# Patient Record
Sex: Female | Born: 1950
Health system: Southern US, Community
[De-identification: ages and names within clinical notes are randomized; demographics above are authoritative.]

## PROBLEM LIST (undated history)

## (undated) DIAGNOSIS — E039 Hypothyroidism, unspecified: Secondary | ICD-10-CM

## (undated) DIAGNOSIS — F419 Anxiety disorder, unspecified: Secondary | ICD-10-CM

## (undated) DIAGNOSIS — I10 Essential (primary) hypertension: Secondary | ICD-10-CM

## (undated) DIAGNOSIS — I499 Cardiac arrhythmia, unspecified: Secondary | ICD-10-CM

## (undated) DIAGNOSIS — G709 Myoneural disorder, unspecified: Secondary | ICD-10-CM

## (undated) DIAGNOSIS — K219 Gastro-esophageal reflux disease without esophagitis: Secondary | ICD-10-CM

## (undated) DIAGNOSIS — E785 Hyperlipidemia, unspecified: Secondary | ICD-10-CM

## (undated) HISTORY — DX: Hyperlipidemia, unspecified: E78.5

## (undated) HISTORY — DX: Gastro-esophageal reflux disease without esophagitis: K21.9

## (undated) HISTORY — PX: APPENDECTOMY: SHX54

## (undated) HISTORY — PX: KNEE ARTHROSCOPY: SUR90

## (undated) HISTORY — DX: Hypothyroidism, unspecified: E03.9

## (undated) HISTORY — PX: CHOLECYSTECTOMY: SHX55

## (undated) HISTORY — PX: PARTIAL HYSTERECTOMY: SHX80

## (undated) HISTORY — PX: CARPAL TUNNEL RELEASE: SHX101

---

## 1997-10-08 ENCOUNTER — Ambulatory Visit (HOSPITAL_COMMUNITY): Admission: RE | Admit: 1997-10-08 | Discharge: 1997-10-08 | Payer: Self-pay | Admitting: Gastroenterology

## 1998-02-26 ENCOUNTER — Ambulatory Visit (HOSPITAL_COMMUNITY): Admission: RE | Admit: 1998-02-26 | Discharge: 1998-02-26 | Payer: Self-pay | Admitting: Family Medicine

## 1998-03-02 ENCOUNTER — Encounter: Payer: Self-pay | Admitting: Family Medicine

## 1998-03-02 ENCOUNTER — Ambulatory Visit (HOSPITAL_COMMUNITY): Admission: RE | Admit: 1998-03-02 | Discharge: 1998-03-02 | Payer: Self-pay | Admitting: Family Medicine

## 2002-02-08 ENCOUNTER — Observation Stay (HOSPITAL_COMMUNITY): Admission: RE | Admit: 2002-02-08 | Discharge: 2002-02-09 | Payer: Self-pay | Admitting: Surgery

## 2002-02-08 ENCOUNTER — Encounter: Payer: Self-pay | Admitting: Surgery

## 2002-02-08 ENCOUNTER — Encounter (INDEPENDENT_AMBULATORY_CARE_PROVIDER_SITE_OTHER): Payer: Self-pay | Admitting: Specialist

## 2003-01-31 ENCOUNTER — Ambulatory Visit (HOSPITAL_BASED_OUTPATIENT_CLINIC_OR_DEPARTMENT_OTHER): Admission: RE | Admit: 2003-01-31 | Discharge: 2003-01-31 | Payer: Self-pay | Admitting: Orthopedic Surgery

## 2003-01-31 ENCOUNTER — Ambulatory Visit (HOSPITAL_COMMUNITY): Admission: RE | Admit: 2003-01-31 | Discharge: 2003-01-31 | Payer: Self-pay | Admitting: Orthopedic Surgery

## 2003-04-07 ENCOUNTER — Ambulatory Visit (HOSPITAL_COMMUNITY): Admission: RE | Admit: 2003-04-07 | Discharge: 2003-04-07 | Payer: Self-pay | Admitting: Gastroenterology

## 2003-08-04 ENCOUNTER — Ambulatory Visit (HOSPITAL_COMMUNITY): Admission: RE | Admit: 2003-08-04 | Discharge: 2003-08-04 | Payer: Self-pay | Admitting: Gastroenterology

## 2004-03-08 ENCOUNTER — Ambulatory Visit: Payer: Self-pay | Admitting: Family Medicine

## 2004-05-08 ENCOUNTER — Emergency Department (HOSPITAL_COMMUNITY): Admission: EM | Admit: 2004-05-08 | Discharge: 2004-05-08 | Payer: Self-pay | Admitting: Emergency Medicine

## 2004-09-01 ENCOUNTER — Ambulatory Visit: Payer: Self-pay | Admitting: Family Medicine

## 2004-11-15 ENCOUNTER — Ambulatory Visit: Payer: Self-pay | Admitting: Family Medicine

## 2005-01-04 ENCOUNTER — Ambulatory Visit: Payer: Self-pay | Admitting: Family Medicine

## 2005-03-02 ENCOUNTER — Encounter: Admission: RE | Admit: 2005-03-02 | Discharge: 2005-03-02 | Payer: Self-pay | Admitting: Neurosurgery

## 2005-04-05 ENCOUNTER — Ambulatory Visit: Payer: Self-pay | Admitting: Family Medicine

## 2005-04-06 ENCOUNTER — Ambulatory Visit (HOSPITAL_COMMUNITY): Admission: RE | Admit: 2005-04-06 | Discharge: 2005-04-06 | Payer: Self-pay | Admitting: Family Medicine

## 2005-05-25 ENCOUNTER — Ambulatory Visit: Payer: Self-pay | Admitting: Family Medicine

## 2005-07-06 ENCOUNTER — Ambulatory Visit: Payer: Self-pay | Admitting: Family Medicine

## 2005-08-31 ENCOUNTER — Ambulatory Visit: Payer: Self-pay | Admitting: Family Medicine

## 2005-10-11 ENCOUNTER — Ambulatory Visit: Payer: Self-pay | Admitting: Family Medicine

## 2006-01-03 ENCOUNTER — Ambulatory Visit: Payer: Self-pay | Admitting: Family Medicine

## 2006-03-13 ENCOUNTER — Ambulatory Visit: Payer: Self-pay | Admitting: Family Medicine

## 2006-06-20 ENCOUNTER — Ambulatory Visit: Payer: Self-pay | Admitting: Family Medicine

## 2006-07-27 ENCOUNTER — Encounter (INDEPENDENT_AMBULATORY_CARE_PROVIDER_SITE_OTHER): Payer: Self-pay | Admitting: Specialist

## 2006-07-27 ENCOUNTER — Encounter: Admission: RE | Admit: 2006-07-27 | Discharge: 2006-07-27 | Payer: Self-pay | Admitting: Gastroenterology

## 2006-07-27 ENCOUNTER — Inpatient Hospital Stay (HOSPITAL_COMMUNITY): Admission: EM | Admit: 2006-07-27 | Discharge: 2006-07-28 | Payer: Self-pay | Admitting: Emergency Medicine

## 2006-09-14 ENCOUNTER — Ambulatory Visit: Payer: Self-pay | Admitting: Family Medicine

## 2009-07-17 ENCOUNTER — Encounter: Admission: RE | Admit: 2009-07-17 | Discharge: 2009-07-17 | Payer: Self-pay | Admitting: Family Medicine

## 2009-10-20 ENCOUNTER — Encounter: Admission: RE | Admit: 2009-10-20 | Discharge: 2009-10-20 | Payer: Self-pay | Admitting: Gastroenterology

## 2010-09-17 NOTE — Op Note (Signed)
[name], [name]                          ACCOUNT NO.:  [id number]   MEDICAL RECORD NO.:  [id number]                   PATIENT TYPE:  OBV   LOCATION:  0466                                 FACILITY:  [facility]   PHYSICIAN:  Currie Paris, M.D.           DATE OF BIRTH:  04/05/1951   DATE OF PROCEDURE:  DATE OF DISCHARGE:                                 OPERATIVE REPORT   CCS#:  [id number]   PREOPERATIVE DIAGNOSES:  1. Biliary diskinesia.  2. Umbilical hernia.   POSTOPERATIVE DIAGNOSES:  1. Biliary diskinesia.  2. Umbilical hernia.   OPERATION:  1. Laparoscopic cholecystectomy with operative cholangiogram.  2. Repair of umbilical hernia.   SURGEON:  Currie Paris, M.D.   ASSISTANT:  Adolph Pollack, M.D.   ANESTHESIA:  General endotracheal.   CLINICAL HISTORY:  This is a 60 year old with biliary type symptoms and a  workup which did not show stones but did show only a 16% ejection fraction  and classic symptoms with injection of CCK. She also had a small umbilical  hernia that she desired to have repaired and we told her we could do that as  we went through the area surgically.   DESCRIPTION OF PROCEDURE:  The patient was seen in the holding area and had  no further questions. She was taken to the operating room and after  satisfactory general endotracheal anesthesia had been obtained, the abdomen  was prepped and draped. The 0.25% plain Marcaine was used for each incision.  I made a longitudinal umbilical incision, identified the hernia and opened  the sac and used this for entry to the abdomen. A holding suture of #0  Vicryl was placed and Hasson introduced and the abdomen insufflated to 15.  Placing the camera, the gallbladder looked normal and we saw no gross  abnormalities that we could identified of the small bowel. We could not  really see pelvic organs.   Additional cannulas were placed in the epigastrium and two in the right  upper quadrant under  direct vision. The gallbladder was retracted over the  liver.   The peritoneum over the cystic duct was opened and the cystic duct and  cystic artery dissected out and we could see the common duct. Once I had the  anatomy identified, I put a single clip on the cystic artery and a single  clip on the cystic duct near its junction with the gallbladder. The cystic  duct was opened and a small amount of bile drained out.   A Cook catheter was then placed percutaneously and threaded into the cystic  duct and operative cholangiography done. This showed a fairly long cystic  duct, normal filling of the common hepatic duct, hepatic radicles and  duodenum but no evidence of any filling defects.   The cystic duct catheter was removed and three clips placed on the stay side  of the cystic duct and  it was divided. Additional clips were placed on the  cystic artery and it was divided leaving two on the stay side. A small  branch had also been clipped earlier.   The gallbladder was removed from below to above with coagulation current of  the cautery. It was fairly thin walled and we did see a little bile so at  the end we put this in a bile bag and irrigated it out with a full liter of  saline to make sure everything was clear.   The gallbladder was pulled out the umbilical port. A final check was made  for hemostasis and everything appeared to be dry, the remaining ports  removed.   Using cautery, I elevated the skin off the fascia and cleared off the  umbilical defect which was about a 1 1/2 cm in diameter. Once I had good  fascia, I closed this with interrupted sutures of #0 Prolene ducking the  knots so they would not be prominent underneath the subcutaneous tissues.  This closed easily with no tension. Because we had done a cholecystectomy, I  elected not to put any mesh in.   The incision was then closed with 4-0 Monocryl subcuticular as were the  other skin incisions or Steri-Strips. The  patient tolerated the procedure  well. There were no operative complications and all counts were correct.                                               Currie Paris, M.D.    CJS/MEDQ  D:  02/08/2002  T:  02/08/2002  Job:  [id number]   cc:   Delaney Meigs, M.D.  [address]  [place]  [place] [address]  Fax: [phone]

## 2010-09-17 NOTE — Op Note (Signed)
NAMESHAMELL, HITTLE                          ACCOUNT NO.:  [id number]   MEDICAL RECORD NO.:  [id number]                   PATIENT TYPE:  AMB   LOCATION:  ENDO                                 FACILITY:  [facility]   PHYSICIAN:  James L. Malon Kindle., M.D.          DATE OF BIRTH:  March 04, 1951   DATE OF PROCEDURE:  08/04/2003  DATE OF DISCHARGE:                                 OPERATIVE REPORT   PROCEDURE PERFORMED:  Colonoscopy.   ENDOSCOPIST:  Llana Aliment. Edwards, M.D.   MEDICATIONS:  Fentanyl 100 mcg, Versed 10 mg IV.   INSTRUMENT USED:  Pediatric Olympus adjustable colonoscope.   INDICATIONS FOR PROCEDURE:  Strong family history of colon cancer, father  died of colon cancer.  This is done as routine colon screening.   DESCRIPTION OF PROCEDURE:  The procedure had been explained to the patient  and consent obtained.  With the patient in the left lateral decubitus  position, the Olympus scope was inserted and advanced. The prep was  excellent.  We were able to reach the cecum using abdominal pressure and  placing the patient on her right side.  The scope was withdrawn and the  cecum, ascending colon, transverse colon, splenic flexure, descending and  sigmoid colon were seen well upon removal.  No polyps were seen throughout  the colon.  There was no significant diverticular disease.  The scope was  withdrawn and the rectum was free of polyps or any other lesions.  The  patient tolerated the procedure well, maintained on low-flow oxygen and  pulse oximeter throughout the procedure.   ASSESSMENT:  Strong family history of colon cancer with negative  colonoscopy, V16.0.   PLAN:  Will recommend yearly Hemoccults and repeat procedure in five years.                                               James L. Malon Kindle., M.D.    Waldron Session  D:  08/04/2003  T:  08/04/2003  Job:  [id number]   cc:   Delaney Meigs, M.D.  [address]  [place]  [place] [address]  Fax: [phone]

## 2010-09-17 NOTE — H&P (Signed)
[name], [name]                  ACCOUNT NO.:  [id number]   MEDICAL RECORD NO.:  [id number]          PATIENT TYPE:  EMS   LOCATION:  ED                           FACILITY:  [facility]   PHYSICIAN:  Angelia Mould. Derrell Lolling, M.D.DATE OF BIRTH:  27-Sep-1950   DATE OF ADMISSION:  07/27/2006  DATE OF DISCHARGE:                              HISTORY & PHYSICAL   CHIEF COMPLAINT:  Abdominal pain, rule out appendicitis.   HISTORY OF PRESENT ILLNESS:  This is a 60 year old white female who  gives a 4 to 5-day history of abdominal pain.  She says that it has  always been more on the right side than anywhere else, but is rather  diffuse, associated with burping and a sensation of bloating and  distension.  She has chronic heartburn, this has been exacerbated.  She  denies vomiting.  She had a bowel movement this morning which was  normal.  She had a bowel movement 3 days ago which was normal.  She had  a loose stool after taking CT scan contrast today.  She says the pain is  no worse today and after getting the CT scan on the way home she ate a  sandwich and a small baked potato at 5:30 p.m.  She denies any voiding  symptoms.   I was called by Dr. Carman Ching this evening.  He stated he had gotten  a CT scan, which was read by Dr. Vear Clock, which was consistent with  appendicitis.  This looks like an enlarged appendix with a little bit of  inflammatory stranding, with the appendix in the retrocecal location.  There is no sign of any bowel obstruction, there is no sign of any free  fluid or abscess, no sign of any other disease process that I can  identify.  I felt that it was best to have her come to the emergency  room for evaluation considering the CT scan and the continued symptoms.   PAST HISTORY:  1. Laparoscopic cholecystectomy 2003 by Dr. Jamey Ripa.  2. Total abdominal hysterectomy 1995, her ovaries remain in place.  3. Bilateral carpal tunnel surgery by Dr. Merlyn Lot.  4. Tonsillectomy as a  child.  5. She has degenerative disk disease and has had a back injection.  6. She has gastroesophageal reflux disease.  7. She has type 2 diabetes.  8. She has hypertension.   CURRENT MEDICATIONS:  1. Actos 30 mg a day.  2. Biotin 2000 mg a day.  3. Finasteride dose unknown 1 a day.  4. Lisinopril 30 mg a day.  5. Metoprolol 25 mg b.i.d.  6. Nexium 30 mg b.i.d.  7. Spironolactone dose unknown daily.  8. Synthroid 100 mcg per day.  9. Darvocet N 100 p.r.n.  10.Fexofenadine p.r.n.  11.Lasix p.r.n.   DRUG ALLERGIES:  DILAUDID.   SOCIAL HISTORY:  The patient is married.  This is her second marriage.  She has two children of her own.  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  Denies the use of alcohol or tobacco.   FAMILY HISTORY:  Father  died of colon cancer.  Mother 80, living, has  dementia.  The patient is an only child.   REVIEW OF SYSTEMS:  Fifteen-system review of systems is performed and is  noncontributory except as described above.  It should be mentioned that  3 or 4 weeks ago she had a workup by Delfin Edis because of upper  abdominal and chest discomfort.  She says she had an echo and a stress  test which was unremarkable, and Dr. Deborah Chalk told her it was most likely  GI symptoms.   PHYSICAL EXAMINATION:  Pleasant, overweight, middle-aged white female in  mild distress.  Temperature 98.7, pulse 105, respirations 20, blood pressure 137/93.  EYES:  Sclerae clear.  Extraocular movements intact.  Ears, mouth, throat, nose, lips, tongue and oropharynx are without gross  lesions.  NECK:  Supple, nontender.  No mass.  No jugular venous distension.  LUNGS:  Clear to auscultation.  No chest Suddeth tenderness.  HEART:  Regular rate and rhythm, no murmur.  Radial and dorsalis pedis  pulses are palpable.  No peripheral edema.  BREASTS:  Not examined.  ABDOMEN:  Obese.  The abdomen is soft but somewhat tender everywhere,  perhaps more tender on the right side but not  dramatically so.  There is  no mass.  She has well-healed upper abdominal trocar incisions from her  lap chole.  She has a well-healed Pfannenstiel incision.  I do not feel  any masses or hernias.  EXTREMITIES:  She moves all four extremities well without pain or  deformity.  NEUROLOGIC:  No gross motor or sensory deficits.   ADMISSION DATA:  CT scan as described above.  White blood cell count  11,900, potassium 3.5, glucose 115, lipase 30.  Urinalysis pending.   ASSESSMENT:  1. Probable retrocecal appendicitis.  Certainly the diagnosis is not      100% certain given her atypical history, but the CT scan is      strongly suggestive and she has had continued daily pain and I      think that a diagnostic laparoscopy is in order to rule out      appendicitis.  2. Type 2 diabetes.  3. Hypertension.  4. Gastroesophageal reflux disease.  5. Status post laparoscopic cholecystectomy.  6. Status post total abdominal hysterectomy.   PLAN:  1. The patient will be admitted, started on intravenous antibiotics.  2. She will be taken to the operating room later on tonight for      laparoscopy, probable laparoscopic appendectomy, possible open      laparotomy.   I have discussed the indication and details of surgery with the patient  and her husband.  Risks and complications have been outlined, including  not limited to bleeding, infection, conversion to open laparotomy,  injury to adjacent  organs such as the ureter or bladder or intestine or vascular structures  with major reconstructive surgery, wound problems such as infection or  hernia, cardiac, pulmonary and thromboembolic problems.  She is aware  that the diagnosis may be in error, and at this time all of her  questions are answered.  She is in full agreement this plan.      Angelia Mould. Derrell Lolling, M.D.  Electronically Signed    HMI/MEDQ  D:  07/27/2006  T:  07/28/2006  Job:  [id number]   cc:   Fayrene Fearing L. Malon Kindle., M.D.  Fax:  [phone]   Delaney Meigs, M.D.  Fax: [phone]

## 2010-09-17 NOTE — Op Note (Signed)
   NAMEHOLLIN, CREWE                          ACCOUNT NO.:  [id number]   MEDICAL RECORD NO.:  [id number]                   PATIENT TYPE:  AMB   LOCATION:  DSC                                  FACILITY:  [facility]   PHYSICIAN:  Cindee Salt, M.D.                    DATE OF BIRTH:  December 05, 1950   DATE OF PROCEDURE:  01/31/2003  DATE OF DISCHARGE:                                 OPERATIVE REPORT   PREOPERATIVE DIAGNOSIS:  Carpal tunnel syndrome, left hand.   POSTOPERATIVE DIAGNOSIS:  Carpal tunnel syndrome, left hand.   OPERATION:  Decompression of left median nerve.   SURGEON:  Cindee Salt, M.D.   ASSISTANT:  Alfredo Bach.   ANESTHESIA:  Forearm-based IV regional.   HISTORY:  The patient is a 60 year old female with a history of carpal  tunnel syndrome -- EMG and nerve conductions positive -- which has not  responded to conservative treatment.   PROCEDURE:  The patient was brought to the operating room where a forearm-  based IV regional anesthetic was carried out without difficulty.  She was  prepped using Duraprep, supine position, left arm free.  A longitudinal  incision was made in the palm and carried down through subcutaneous tissues.  Bleeders were electrocauterized.  The palmar fascia was split, the  superficial palmar arch identified, the flexor tendons to the ring and  little fingers identified.  To the ulnar side of the median nerve, the  carpal retinaculum was incised with sharp dissection.  A right-angle and  Sewall retractor were placed between skin and forearm fascia.  The fascia  was released for approximately 3 cm proximal to the wrist crease under  direct vision.  Canal was explored and no further lesions were identified.  The wound was irrigated and the skin was closed with interrupted 5-0 nylon  sutures.  A sterile compressive dressing and splint were applied.  The  patient tolerated the procedure well and was taken to the recovery room for  observation in  satisfactory condition.  It is noted that an area of  hyperemia in the nerve was identified.  The patient tolerated the procedure  well.   She is discharged home to return to the [facility] in one  week on Vicodin and Keflex.                                               Cindee Salt, M.D.    Angelique Blonder  D:  01/31/2003  T:  02/01/2003  Job:  [id number]

## 2010-09-17 NOTE — Op Note (Signed)
[name], [name]                          ACCOUNT NO.:  [id number]   MEDICAL RECORD NO.:  [id number]                   PATIENT TYPE:  AMB   LOCATION:  ENDO                                 FACILITY:  [facility]   PHYSICIAN:  James L. Malon Kindle., M.D.          DATE OF BIRTH:  11/16/1950   DATE OF PROCEDURE:  04/07/2003  DATE OF DISCHARGE:                                 OPERATIVE REPORT   PROCEDURE:  Esophagogastroduodenoscopy.   MEDICATIONS:  Cetacaine spray, fentanyl 62.5 mcg, Versed 5 mg IV.   INDICATIONS:  The patient has a positive antibody for H. pylori, was treated  and has persistent dyspepsia despite the use of Aciphex for reflux.  She  does have a history of an esophageal stricture, apparently has undergone  dilatation in the past six to eight years ago.   DESCRIPTION OF PROCEDURE:  The procedure had been explained to the patient  and consent obtained.  With the patient in the left lateral decubitus  position, the Olympus scope was inserted and advanced.  We advanced down  into the stomach.  The gastric outlet was identified and passed.  The  duodenum including the bulb and second portion was seen well and was normal.  The scope was withdrawn back in the stomach.  The pyloric channel and antrum  and body were normal without ulceration of inflammation.  A biopsy was taken  for rapid urease test for Helicobacter.  The fundus and cardia were seen  well in the retroflexed view.  There was a 2-3 cm hiatal hernia with a  smooth-appearing stricture that was fairly patent with no obvious delay in  passage of the scope.  The distal esophagus was seen well and was normal, as  well as the proximal esophagus.  The scope was withdrawn.  The patient  tolerated the procedure well.   ASSESSMENT:  1. Gastroesophageal reflux disease, code 530.81.  2. Esophageal stricture, 530.3.   PLAN:  1. Will give a sheet of antireflux instructions.  2. Will continue on Aciphex and see back in my  office in January, and we     will set up the screening colonoscopy.  3. We will check the results of the test for Helicobacter.                                               James L. Malon Kindle., M.D.    Waldron Session  D:  04/07/2003  T:  04/08/2003  Job:  [id number]   cc:   Delaney Meigs, M.D.  [address]  [place]  [place] [address]  Fax: [phone]

## 2010-09-17 NOTE — Op Note (Signed)
[name], [name]                  ACCOUNT NO.:  [id number]   MEDICAL RECORD NO.:  [id number]          PATIENT TYPE:  INP   LOCATION:  0107                         FACILITY:  [facility]   PHYSICIAN:  Angelia Mould. Derrell Lolling, M.D.DATE OF BIRTH:  [birth year]   DATE OF PROCEDURE:  07/27/2006  DATE OF DISCHARGE:                               OPERATIVE REPORT   PREOPERATIVE DIAGNOSIS:  Acute appendicitis.   POSTOPERATIVE DIAGNOSIS:  Acute appendicitis.   OPERATION PERFORMED:  Laparoscopic appendectomy.   SURGEON:  Angelia Mould. Derrell Lolling, M.D.   OPERATIVE INDICATIONS:  This is a 60 year old white female who has had  abdominal pain for four or five  5 days.  This has been somewhat right-  sided but also diffuse.  It has  bothered her every day and has been  constant but she has not had any nausea, vomiting, fever or chills or  diarrhea.  She saw Dr. Carman Ching today.  A CT scan was obtained  which shows a thickened, slightly inflamed appendix but no sign of any  abscess or rupture and no other inflammatory process within the  abdominal cavity.  I was asked to see her.  Although her history and  physical exam is not completely typical,  it is not inconsistent and we  felt that we needed to take her for diagnostic laparoscopy.   OPERATIVE FINDINGS:  The appendix was inflamed.  The distal 50-60% of  the appendix was reddened and thickened.  There is no gangrene, no  perforation and really, not any exudate.  The proximal appendix near the  cecum looks normal.  The terminal ileum, right colon, liver, and the  sigmoid colon, and proximal rectum looked normal.  She had previously  had a hysterectomy and a cholecystectomy.  There was some adhesions in  the lower midline which had to be taken down, but these were just  omental adhesions.   OPERATIVE TECHNIQUE:  Following the induction of general endotracheal  anesthesia, Foley catheter was inserted.  The patient's abdomen was  prepped and draped in  sterile fashion.  Intravenous antibiotics were  given prior to the incision.  The patient was identified as to correct  patient, correct procedure.  A vertically oriented incision was made  just above the umbilicus.  Fascia was incised the midline and the  abdominal cavity entered under direct vision.  A 10 mm Hassan trocar was  inserted and secured the pursestring suture of 0 Vicryl.  Pneumoperitoneum was created.  Video camera was inserted with  visualization findings as described above.  A 5-mm trocar was placed in  the right upper quadrant.  I used this to take down some of the omental  adhesions from the lower midline and then there were no more adhesions  to the anterior abdominal Desjardin.  I placed a 12-mm trocar in the midline  just above the pubic hair.   I explored the abdomen as described above.  The appendix appeared to be  the only abnormal finding.  I elevated the appendix.  I divided the  appendiceal mesentery and appendiceal artery using  the Harmonic scalpel.  This took several small stabs but eventually I had I had the appendix  completely freed up for I could see clearly where it inserted into the  cecum.  The appendix was transected using an Endo-GIA stapling device at  the level of the cecum.  After the stapler was fired and removed, the  appendix was placed in the specimen bag and removed.  The staple line on  the cecum and terminal ileum were inspected.  The staple line appeared  quite secure.  The cecum was healthy.  There was no bleeding.  We  irrigated this area just a little bit with irrigation fluid and it was  completely clear.  Trocars were removed under direct vision.  There was  no bleeding from trocar sites.  Pneumoperitoneum was released.  The  fascia at the umbilicus and the fascia in the suprapubic trocar sites  were closed with 0 Vicryl sutures.  Skin was closed with skin staples.  Kling bandages were placed and the patient taken recovery room in stable   condition.  Estimated blood loss was about 10 mL.  Complications none.  Sponge, needle and instrument counts were correct.      Angelia Mould. Derrell Lolling, M.D.  Electronically Signed     HMI/MEDQ  D:  07/27/2006  T:  07/28/2006  Job:  [id number]   cc:   Fayrene Fearing L. Malon Kindle., M.D.  Fax: [phone]   Delaney Meigs, M.D.  Fax: [phone]

## 2011-09-06 ENCOUNTER — Other Ambulatory Visit: Payer: Self-pay | Admitting: Family Medicine

## 2011-09-06 DIAGNOSIS — R222 Localized swelling, mass and lump, trunk: Secondary | ICD-10-CM

## 2011-09-09 ENCOUNTER — Other Ambulatory Visit (HOSPITAL_COMMUNITY): Payer: Self-pay

## 2011-09-12 ENCOUNTER — Ambulatory Visit (HOSPITAL_COMMUNITY)
Admission: RE | Admit: 2011-09-12 | Discharge: 2011-09-12 | Disposition: A | Discharge status: Home / Self Care | Payer: BC Managed Care – PPO | Source: Ambulatory Visit | Attending: Family Medicine | Admitting: Family Medicine

## 2011-09-12 ENCOUNTER — Encounter (HOSPITAL_COMMUNITY): Payer: Self-pay

## 2011-09-12 DIAGNOSIS — I10 Essential (primary) hypertension: Secondary | ICD-10-CM | POA: Insufficient documentation

## 2011-09-12 DIAGNOSIS — R05 Cough: Secondary | ICD-10-CM | POA: Insufficient documentation

## 2011-09-12 DIAGNOSIS — R222 Localized swelling, mass and lump, trunk: Secondary | ICD-10-CM

## 2011-09-12 DIAGNOSIS — R059 Cough, unspecified: Secondary | ICD-10-CM | POA: Insufficient documentation

## 2011-09-12 DIAGNOSIS — R918 Other nonspecific abnormal finding of lung field: Secondary | ICD-10-CM | POA: Insufficient documentation

## 2011-09-12 DIAGNOSIS — R0602 Shortness of breath: Secondary | ICD-10-CM | POA: Insufficient documentation

## 2011-09-12 DIAGNOSIS — E119 Type 2 diabetes mellitus without complications: Secondary | ICD-10-CM | POA: Insufficient documentation

## 2011-09-12 HISTORY — DX: Essential (primary) hypertension: I10

## 2011-09-12 MED ORDER — IOHEXOL 300 MG/ML  SOLN
80.0000 mL | Freq: Once | INTRAMUSCULAR | Status: AC | PRN
  Administered 2011-09-12: 80 mL via INTRAVENOUS

## 2011-09-21 ENCOUNTER — Encounter: Payer: Self-pay | Admitting: Internal Medicine

## 2011-09-22 ENCOUNTER — Encounter: Payer: Self-pay | Admitting: Internal Medicine

## 2011-09-22 ENCOUNTER — Ambulatory Visit (INDEPENDENT_AMBULATORY_CARE_PROVIDER_SITE_OTHER): Payer: BC Managed Care – PPO | Admitting: Internal Medicine

## 2011-09-22 VITALS — BP 128/88 | HR 88 | Temp 98.2°F | Ht 69.0 in | Wt 224.6 lb

## 2011-09-22 DIAGNOSIS — E1159 Type 2 diabetes mellitus with other circulatory complications: Secondary | ICD-10-CM | POA: Insufficient documentation

## 2011-09-22 DIAGNOSIS — R911 Solitary pulmonary nodule: Secondary | ICD-10-CM | POA: Insufficient documentation

## 2011-09-22 DIAGNOSIS — R05 Cough: Secondary | ICD-10-CM

## 2011-09-22 DIAGNOSIS — I10 Essential (primary) hypertension: Secondary | ICD-10-CM | POA: Insufficient documentation

## 2011-09-22 DIAGNOSIS — R059 Cough, unspecified: Secondary | ICD-10-CM | POA: Insufficient documentation

## 2011-09-22 MED ORDER — AMOXICILLIN-POT CLAVULANATE 875-125 MG PO TABS: 1.0000 | ORAL_TABLET | Freq: Two times a day (BID) | ORAL | Status: AC

## 2011-09-22 MED ORDER — OLMESARTAN MEDOXOMIL 20 MG PO TABS: 20.0000 mg | ORAL_TABLET | Freq: Every day | ORAL | Status: DC

## 2011-09-22 NOTE — Progress Notes (Signed)
  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  Sleeping ok without nocturnal  or early am exacerbation  of respiratory  c/o's or need for noct saba. Also denies any obvious fluctuation of symptoms with weather or environmental changes or other aggravating or alleviating factors except as outlined above   Review of Systems  Constitutional: Positive for chills. Negative for fever and unexpected weight change.  HENT: Positive for rhinorrhea, voice change, postnasal drip and sinus pressure. Negative for ear pain, nosebleeds, congestion, sore throat, sneezing, trouble swallowing and dental problem.   Eyes: Positive for visual disturbance.  Respiratory: Positive for cough and shortness of breath. Negative for choking.   Cardiovascular: Negative for chest pain and leg swelling.  Gastrointestinal: Positive for abdominal pain. Negative for vomiting and diarrhea.  Genitourinary: Negative for difficulty urinating.  Musculoskeletal: Negative for arthralgias.  Skin: Negative for rash.  Neurological: Negative for tremors, syncope and headaches.  Hematological: Does not bruise/bleed easily.       Objective:   Physical Exam  09/22/2011  224  amb obese anxious wf nad HEENT: nl dentition, turbinates, and orophanx. Nl external ear canals without cough reflex   NECK :  without JVD/Nodes/TM/ nl carotid upstrokes bilaterally   LUNGS: no acc muscle use, clear to A and P bilaterally without cough on insp or exp  maneuvers   CV:  RRR  no s3 or murmur or increase in P2, no edema   ABD:  soft and nontender with nl excursion in the supine position. No bruits or organomegaly, bowel sounds nl  MS:  warm without deformities, calf tenderness, cyanosis or clubbing  SKIN: warm and dry without lesions    NEURO:  alert, approp, no deficits    Ct chest 09/12/11 1. No acute cardiopulmonary process identified.  2. Nonspecific 6 mm noncalcified right lower lobe pulmonary  nodule, not imaged previously. If the patient is at high risk for  bronchogenic carcinoma, follow-up chest CT at 6-12 months is  recommended. If the patient is at low risk for bronchogenic  carcinoma, follow-up chest CT at 12 months is recommended     Assessment & Plan:

## 2011-09-22 NOTE — Assessment & Plan Note (Signed)
ACE inhibitors are problematic in  pts with airway complaints because  even experienced pulmonologists can't always distinguish ace effects from copd/asthma.  By themselves they don't actually cause a problem, much like oxygen can't by itself start a fire, but they certainly serve as a powerful catalyst or enhancer for any "fire"  or inflammatory process in the upper airway, be it caused by an ET  tube or more commonly reflux (especially in the obese or pts with known GERD or who are on biphoshonates).    In the era of ARB near equivalency until we have a better handle on the reversibility of the airway problem, it just makes sense to avoid ACEI  entirely in the short run and then decide later, having established a level of airway control using a reasonable limited regimen, whether to add back ace but even then being very careful to observe the pt for worsening airway control and number of meds used/ needed to control symptoms.    For now try benicar 20 mg daily (samples given until returns to pulmonary clinic first Tues in July)

## 2011-09-22 NOTE — Patient Instructions (Addendum)
Nexium Take 30-60 min before first meal of the day and add Pepcid 20 mg one at bedtime until the cough is gone  mucinex dm up 1200 mg every 12 hours as needed for cough  Stop lisinopril and benicar 20 mg one daily  Augmentin 875 mg twice with a large glass of water and yogurt for lunch  GERD (REFLUX)  is an extremely common cause of respiratory symptoms, many times with no significant heartburn at all.    It can be treated with medication, but also with lifestyle changes including avoidance of late meals, excessive alcohol, smoking cessation, and avoid fatty foods, chocolate, peppermint, colas, red wine, and acidic juices such as orange juice.  NO MINT OR MENTHOL PRODUCTS SO NO COUGH DROPS  USE SUGARLESS CANDY INSTEAD (jolley ranchers or Stover's)  NO OIL BASED VITAMINS - use powdered substitutes.  We will see you July 2 at [facility] office

## 2011-09-22 NOTE — Assessment & Plan Note (Signed)
Most likely this is a form of  Classic Upper airway cough syndrome, so named because it's frequently impossible to sort out how much is  CR/sinusitis with freq throat clearing (which can be related to primary GERD)   vs  causing  secondary (" extra esophageal")  GERD from wide swings in gastric pressure that occur with throat clearing, often  promoting self use of mint and menthol lozenges that reduce the lower esophageal sphincter tone and exacerbate the problem further in a cyclical fashion.   These are the same pts (now being labeled as having "irritable larynx syndrome" by some cough centers) who not infrequently have a history of having failed to tolerate ace inhibitors,  dry powder inhalers or biphosphonates or report having atypical reflux symptoms that don't respond to standard doses of PPI , and are easily confused as having aecopd or asthma flares by even experienced allergists/ pulmonologists.  She is on acei and needs trial off and rx for gerd/ empiric sinus dz then regroup in 6 weeks  See instructions for specific recommendations which were reviewed directly with the patient who was given a copy with highlighter outlining the key components.

## 2011-09-22 NOTE — Assessment & Plan Note (Addendum)
Her risk re SPN is low but now zero and the absence of this small a lesion on prev abd ct it notable but not that great a concern since identical apples to apples comparison of slices is difficult on such a small lesion.  Therefore reasonable to repeat limited ct in 6 months and resect if growing, but only p addressing the patients pulmonary symptoms which presently limit her from crossing the floor at her work at [employer] s stopping and gasping for air.  Discussed in detail all the  indications, usual  risks and very limited alternatives  relative to the benefits with patient who agrees to proceed with conservative f/u as per radiology's original recs

## 2011-11-08 ENCOUNTER — Ambulatory Visit: Payer: BC Managed Care – PPO | Admitting: Internal Medicine

## 2011-11-16 ENCOUNTER — Ambulatory Visit (INDEPENDENT_AMBULATORY_CARE_PROVIDER_SITE_OTHER): Payer: BC Managed Care – PPO | Admitting: Internal Medicine

## 2011-11-16 ENCOUNTER — Encounter: Payer: Self-pay | Admitting: Internal Medicine

## 2011-11-16 VITALS — BP 114/72 | HR 77 | Temp 98.4°F | Ht 69.0 in | Wt 225.4 lb

## 2011-11-16 DIAGNOSIS — R911 Solitary pulmonary nodule: Secondary | ICD-10-CM

## 2011-11-16 DIAGNOSIS — R05 Cough: Secondary | ICD-10-CM

## 2011-11-16 DIAGNOSIS — R059 Cough, unspecified: Secondary | ICD-10-CM

## 2011-11-16 DIAGNOSIS — I10 Essential (primary) hypertension: Secondary | ICD-10-CM

## 2011-11-16 MED ORDER — OLMESARTAN MEDOXOMIL 20 MG PO TABS: 20.0000 mg | ORAL_TABLET | Freq: Every day | ORAL | Status: DC

## 2011-11-16 NOTE — Progress Notes (Signed)
  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 Abn cxr > CT c/w 5 mm spn so referred 09/22/2011 to pulmonary clinic by Dr Christell Constant.  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 rec Nexium Take 30-60 min before first meal of the day and add Pepcid 20 mg one at bedtime until the cough is gone mucinex dm up 1200 mg every 12 hours as needed for cough Stop lisinopril and benicar 20 mg one daily Augmentin 875 mg twice with a large glass of water and yogurt for lunch GERD diet F/u [place] office   11/16/2011 f/u ov/Wert cc cough has resolved back on lisinopril this week only because ran out of samples of benicar. No sob. No overt hb or reflux symptoms. No sob or variability to activity tolerance   Sleeping ok without nocturnal  or early am exacerbation  of respiratory  c/o's or need for noct saba. Also denies any obvious fluctuation of symptoms with weather or environmental changes or other aggravating or alleviating factors except as outlined above  ROS  The following are not active complaints unless bolded sore throat, dysphagia, dental problems, itching, sneezing,  nasal congestion or excess/ purulent secretions, ear ache,   fever, chills, sweats, unintended wt loss, pleuritic or exertional cp, hemoptysis,  orthopnea pnd or leg swelling, presyncope, palpitations, heartburn, abdominal pain, anorexia, nausea, vomiting, diarrhea  or change in bowel or urinary habits, change in stools or urine, dysuria,hematuria,  rash, arthralgias, visual complaints, headache, numbness weakness or ataxia or problems with walking or coordination,  change in mood/affect  or memory.              Objective:   Physical Exam  09/22/2011  224 > 11/16/2011 225  amb obese anxious wf nad  HEENT: nl dentition, turbinates, and orophanx. Nl external ear canals without cough reflex   NECK :  without JVD/Nodes/TM/ nl carotid upstrokes bilaterally   LUNGS: no acc muscle use, clear to A and P bilaterally without cough on insp or exp maneuvers   CV:  RRR  no s3 or murmur or increase in P2, no edema   ABD:  soft and nontender with nl excursion in the supine position. No bruits or organomegaly, bowel sounds nl  MS:  warm without deformities, calf tenderness, cyanosis or clubbing  SKIN: warm and dry without lesions    NEURO:  alert, approp, no deficits    Ct chest 09/12/11 1. No acute cardiopulmonary process identified.  2. Nonspecific 6 mm noncalcified right lower lobe pulmonary  nodule, not imaged previously. If the patient is at high risk for  bronchogenic carcinoma, follow-up chest CT at 6-12 months is  recommended. If the patient is at low risk for bronchogenic  carcinoma, follow-up chest CT at 12 months is recommended     Assessment & Plan:

## 2011-11-16 NOTE — Patient Instructions (Addendum)
We will call to schedule a limited CT chest in November 2013

## 2011-11-18 NOTE — Assessment & Plan Note (Signed)
Change acei to ARB effective 09/22/2011 due to cough > resolved

## 2011-11-18 NOTE — Assessment & Plan Note (Signed)
Placed in tickle file for f/u 09/11/12

## 2011-11-18 NOTE — Assessment & Plan Note (Signed)
Most c/w   Classic Upper airway cough syndrome, so named because it's frequently impossible to sort out how much is  CR/sinusitis with freq throat clearing (which can be related to primary GERD)   vs  causing  secondary (" extra esophageal")  GERD from wide swings in gastric pressure that occur with throat clearing, often  promoting self use of mint and menthol lozenges that reduce the lower esophageal sphincter tone and exacerbate the problem further in a cyclical fashion.   These are the same pts (now being labeled as having "irritable larynx syndrome" by some cough centers) who not infrequently have a history of having failed to tolerate ace inhibitors,  dry powder inhalers or biphosphonates or report having atypical reflux symptoms that don't respond to standard doses of PPI , and are easily confused as having aecopd or asthma flares by even experienced allergists/ pulmonologists.   [name] is clearly better off ACEI so would continue off this

## 2011-11-28 ENCOUNTER — Telehealth: Payer: Self-pay | Admitting: Internal Medicine

## 2011-11-28 MED ORDER — VALSARTAN 160 MG PO TABS: 160.0000 mg | ORAL_TABLET | Freq: Every day | ORAL | Status: DC

## 2011-11-28 NOTE — Telephone Encounter (Signed)
Returning call can be reached at [phone] until 3p.[name]

## 2011-11-28 NOTE — Telephone Encounter (Signed)
Ref # for this rx is [id number].[name]

## 2011-11-28 NOTE — Telephone Encounter (Signed)
benicar  is not covered and alternatives is diovan or losartan. Please advised Dr. Sherene Sires, thanks  Allergies  Allergen Reactions  . Dilaudid (Hydromorphone Hcl)

## 2011-11-28 NOTE — Telephone Encounter (Signed)
diovan 160 one qd is best choice

## 2011-11-28 NOTE — Telephone Encounter (Signed)
I have [facility]. lmomtcb x1 for pt to make aware of change

## 2011-11-28 NOTE — Telephone Encounter (Signed)
Returned patient call and spoke with her, informed her that diovan rx had been sent [facility].  Patient verbalized understanding and nothing further was needed at this time,

## 2012-03-06 ENCOUNTER — Telehealth: Payer: Self-pay | Admitting: *Deleted

## 2012-03-06 ENCOUNTER — Telehealth: Payer: Self-pay | Admitting: Internal Medicine

## 2012-03-06 DIAGNOSIS — R911 Solitary pulmonary nodule: Secondary | ICD-10-CM

## 2012-03-06 NOTE — Telephone Encounter (Signed)
Done See other PN dated today

## 2012-03-06 NOTE — Telephone Encounter (Signed)
Message copied by Christen Butter on Tue Mar 06, 2012  9:13 AM ------      Message from: Sandrea Hughs B      Created: Wed Nov 16, 2011  2:31 PM       Needs ct limited to R lung nodule this month

## 2012-03-06 NOTE — Telephone Encounter (Signed)
Spoke with pt and notified time to set up ct chest  She verbalized understanding and order was sent to [facility]

## 2012-03-15 ENCOUNTER — Encounter: Payer: Self-pay | Admitting: Internal Medicine

## 2012-03-15 ENCOUNTER — Ambulatory Visit (INDEPENDENT_AMBULATORY_CARE_PROVIDER_SITE_OTHER)
Admission: RE | Admit: 2012-03-15 | Discharge: 2012-03-15 | Disposition: A | Discharge status: Home / Self Care | Payer: BC Managed Care – PPO | Source: Ambulatory Visit | Attending: Internal Medicine | Admitting: Internal Medicine

## 2012-03-15 DIAGNOSIS — R911 Solitary pulmonary nodule: Secondary | ICD-10-CM

## 2012-03-16 NOTE — Progress Notes (Signed)
Quick Note:  Spoke with pt and notified of results per Dr. Wert. Pt verbalized understanding and denied any questions.  ______ 

## 2012-09-03 ENCOUNTER — Telehealth: Payer: Self-pay | Admitting: *Deleted

## 2012-09-03 DIAGNOSIS — R911 Solitary pulmonary nodule: Secondary | ICD-10-CM

## 2012-09-03 NOTE — Telephone Encounter (Signed)
LMTCB for pt 

## 2012-09-03 NOTE — Telephone Encounter (Signed)
Message copied by Christen Butter on Mon Sep 03, 2012  4:18 PM ------      Message from: Nyoka Cowden      Created: Fri Nov 18, 2011  8:36 PM       F/u ct chest due ------

## 2012-09-04 NOTE — Telephone Encounter (Signed)
It's w/o CM Have you talked to her and let her know about it yet? Please advise and I will be happy to send order, thanks!

## 2012-09-04 NOTE — Telephone Encounter (Signed)
Does CT need to be w/ or w/o contrast? Please advise MW thanks

## 2012-09-04 NOTE — Telephone Encounter (Signed)
Ct scheduled 09/10/12@[facility]  Tobe Sos

## 2012-09-04 NOTE — Telephone Encounter (Signed)
Pt aware someone will call to get this set up. Order sent

## 2012-09-04 NOTE — Telephone Encounter (Signed)
Pt returned Leslie's call.  Holly D Pryor ° °

## 2012-09-10 ENCOUNTER — Encounter: Payer: Self-pay | Admitting: Internal Medicine

## 2012-09-10 ENCOUNTER — Ambulatory Visit (INDEPENDENT_AMBULATORY_CARE_PROVIDER_SITE_OTHER)
Admission: RE | Admit: 2012-09-10 | Discharge: 2012-09-10 | Disposition: A | Discharge status: Home / Self Care | Payer: BC Managed Care – PPO | Source: Ambulatory Visit | Attending: Internal Medicine | Admitting: Internal Medicine

## 2012-09-10 DIAGNOSIS — R911 Solitary pulmonary nodule: Secondary | ICD-10-CM

## 2012-09-11 ENCOUNTER — Telehealth: Payer: Self-pay | Admitting: Internal Medicine

## 2012-09-11 NOTE — Telephone Encounter (Signed)
Pt is aware of CT scan results. 

## 2012-10-01 ENCOUNTER — Other Ambulatory Visit (HOSPITAL_COMMUNITY): Payer: Self-pay | Admitting: Gastroenterology

## 2012-10-01 DIAGNOSIS — R1011 Right upper quadrant pain: Secondary | ICD-10-CM

## 2012-10-04 ENCOUNTER — Ambulatory Visit (HOSPITAL_COMMUNITY)
Admission: RE | Admit: 2012-10-04 | Discharge: 2012-10-04 | Disposition: A | Discharge status: Home / Self Care | Payer: BC Managed Care – PPO | Source: Ambulatory Visit | Attending: Gastroenterology | Admitting: Gastroenterology

## 2012-10-04 ENCOUNTER — Other Ambulatory Visit (HOSPITAL_COMMUNITY): Payer: Self-pay | Admitting: Gastroenterology

## 2012-10-04 DIAGNOSIS — K7689 Other specified diseases of liver: Secondary | ICD-10-CM | POA: Insufficient documentation

## 2012-10-04 DIAGNOSIS — R1011 Right upper quadrant pain: Secondary | ICD-10-CM

## 2012-10-09 ENCOUNTER — Telehealth: Payer: Self-pay | Admitting: Family Medicine

## 2012-10-09 NOTE — Telephone Encounter (Signed)
APPT MADE

## 2012-10-10 ENCOUNTER — Encounter: Payer: Self-pay | Admitting: Family Medicine

## 2012-10-10 ENCOUNTER — Ambulatory Visit (INDEPENDENT_AMBULATORY_CARE_PROVIDER_SITE_OTHER): Payer: BC Managed Care – PPO

## 2012-10-10 ENCOUNTER — Ambulatory Visit (INDEPENDENT_AMBULATORY_CARE_PROVIDER_SITE_OTHER): Payer: BC Managed Care – PPO | Admitting: Family Medicine

## 2012-10-10 VITALS — BP 128/81 | HR 81 | Temp 98.5°F | Ht 69.0 in | Wt 222.2 lb

## 2012-10-10 DIAGNOSIS — M549 Dorsalgia, unspecified: Secondary | ICD-10-CM

## 2012-10-10 MED ORDER — NAPROXEN 500 MG PO TBEC: 500.0000 mg | DELAYED_RELEASE_TABLET | Freq: Two times a day (BID) | ORAL | Status: DC

## 2012-10-10 NOTE — Progress Notes (Signed)
  Subjective:    Patient ID: [name], female    DOB: 19-Jul-1950, 62 y.o.   MRN: [id number]  HPI The patient comes in today after pressure washing her deck for 8 hours 2 weeks ago in a biased position. She developed pain in her back radiating around to her front that evening. It has continued since that time. She has had lab work ordered by the gastroenterologist liver function test, amylase, BMP, and cholesterol. All these labs were basically within normal limits. She did get an ultrasound of the liver and abdomen and according to her all of this was normal except for a fatty liver. She's been taking Robaxin 750 about half to one every 4 hours. She however continues to have a lot of back pain and secondary muscles for.  Review of Systems  Musculoskeletal: Positive for myalgias (back spasms) and back pain (mid thoracic x 2 weeks).       Objective:   Physical Exam  Nursing note and vitals reviewed. Constitutional: She is oriented to person, place, and time. She appears well-developed and well-nourished. No distress.  HENT:  Head: Normocephalic and atraumatic.  Right Ear: External ear normal.  Left Ear: External ear normal.  Mouth/Throat: Oropharynx is clear and moist. No oropharyngeal exudate.  Eyes: Conjunctivae are normal. Right eye exhibits no discharge. Left eye exhibits no discharge. No scleral icterus.  Neck: Normal range of motion. Neck supple. No thyromegaly present.  Cardiovascular: Normal rate and regular rhythm.   No murmur heard. Pulmonary/Chest: Effort normal and breath sounds normal. She has no wheezes. She has no rales.  Abdominal: Soft. She exhibits no mass. There is tenderness (slight generalized tenderness). There is no rebound and no guarding.  Musculoskeletal: She exhibits no edema.  Neurological: She is alert and oriented to person, place, and time.  Skin: Skin is warm and dry.  Psychiatric: She has a normal mood and affect. Her behavior is normal. Thought content  normal.    [facility] reading (PRIMARY) by  Dr. Christell Constant                                   Lower thoracic and upper lumbar spine films; degenerative changes      Assessment & Plan:  1. Back pain - DG Thoracic Spine 2 View; Future - DG Lumbar Spine 2-3 Views; Future -Naprosyn enteric-coated 500 mg one twice daily after breakfast and supper #60  Patient Instructions  Take Naprosyn 500 twice daily after meals, make sure you take Nexium to protect her stomach, its GI issues discontinue If x-rays are stable we will schedule you for physical therapy Use warm wet compresses to the back 20 minutes 3 or 4 times daily Avoid heavy lifting pushing or pulling. Even vacuum cleaning may aggravate her back here

## 2012-10-10 NOTE — Patient Instructions (Signed)
Take Naprosyn 500 twice daily after meals, make sure you take Nexium to protect her stomach, its GI issues discontinue If x-rays are stable we will schedule you for physical therapy Use warm wet compresses to the back 20 minutes 3 or 4 times daily Avoid heavy lifting pushing or pulling. Even vacuum cleaning may aggravate her back here

## 2012-10-12 ENCOUNTER — Telehealth: Payer: Self-pay | Admitting: Family Medicine

## 2013-03-05 ENCOUNTER — Encounter (HOSPITAL_COMMUNITY): Payer: Self-pay | Admitting: Emergency Medicine

## 2013-03-05 ENCOUNTER — Emergency Department (HOSPITAL_COMMUNITY)
Admission: EM | Admit: 2013-03-05 | Discharge: 2013-03-05 | Disposition: A | Discharge status: Home / Self Care | Payer: BC Managed Care – PPO | Attending: Emergency Medicine | Admitting: Emergency Medicine

## 2013-03-05 ENCOUNTER — Ambulatory Visit (INDEPENDENT_AMBULATORY_CARE_PROVIDER_SITE_OTHER): Payer: BC Managed Care – PPO | Admitting: Family Medicine

## 2013-03-05 VITALS — BP 121/77 | HR 83 | Temp 97.3°F | Ht 69.0 in | Wt 222.0 lb

## 2013-03-05 DIAGNOSIS — E785 Hyperlipidemia, unspecified: Secondary | ICD-10-CM | POA: Insufficient documentation

## 2013-03-05 DIAGNOSIS — R002 Palpitations: Secondary | ICD-10-CM

## 2013-03-05 DIAGNOSIS — R0600 Dyspnea, unspecified: Secondary | ICD-10-CM

## 2013-03-05 DIAGNOSIS — I10 Essential (primary) hypertension: Secondary | ICD-10-CM | POA: Insufficient documentation

## 2013-03-05 DIAGNOSIS — K219 Gastro-esophageal reflux disease without esophagitis: Secondary | ICD-10-CM | POA: Insufficient documentation

## 2013-03-05 DIAGNOSIS — E119 Type 2 diabetes mellitus without complications: Secondary | ICD-10-CM | POA: Insufficient documentation

## 2013-03-05 DIAGNOSIS — R0602 Shortness of breath: Secondary | ICD-10-CM | POA: Insufficient documentation

## 2013-03-05 DIAGNOSIS — R0989 Other specified symptoms and signs involving the circulatory and respiratory systems: Secondary | ICD-10-CM

## 2013-03-05 DIAGNOSIS — E039 Hypothyroidism, unspecified: Secondary | ICD-10-CM | POA: Insufficient documentation

## 2013-03-05 DIAGNOSIS — Z79899 Other long term (current) drug therapy: Secondary | ICD-10-CM | POA: Insufficient documentation

## 2013-03-05 DIAGNOSIS — R0609 Other forms of dyspnea: Secondary | ICD-10-CM

## 2013-03-05 LAB — CBC
MCH: 29.8 pg (ref 26.0–34.0)
MCV: 86.6 fL (ref 78.0–100.0)
Platelets: 261 10*3/uL (ref 150–400)
RBC: 4.94 MIL/uL (ref 3.87–5.11)
RDW: 13.9 % (ref 11.5–15.5)

## 2013-03-05 LAB — BASIC METABOLIC PANEL
CO2: 24 mEq/L (ref 19–32)
Calcium: 9.6 mg/dL (ref 8.4–10.5)
Creatinine, Ser: 0.58 mg/dL (ref 0.50–1.10)
GFR calc Af Amer: >90 mL/min (ref >90)
GFR calc non Af Amer: >90 mL/min (ref >90)
Glucose, Bld: 120 mg/dL — ABNORMAL HIGH (ref 70–99)
Sodium: 137 mEq/L (ref 135–145)

## 2013-03-05 LAB — PRO B NATRIURETIC PEPTIDE: Pro B Natriuretic peptide (BNP): 11 pg/mL (ref 0–125)

## 2013-03-05 LAB — POCT I-STAT TROPONIN I: Troponin i, poc: 0 ng/mL (ref 0.00–0.08)

## 2013-03-05 NOTE — ED Notes (Signed)
Pt from work, c/o chest palpitations w/ sob starting at 8 am. Pt he ekg done at work, states changes in t-wave, sent her for labs. Pt alert and oriented, NSD. Pt received 324 asa and 1 nitro sublingual pta.

## 2013-03-05 NOTE — ED Provider Notes (Signed)
CSN: [id number]     Arrival date & time 03/05/13  1324 History   First MD Initiated Contact with Patient 03/05/13 1409     Chief Complaint  Patient presents with  . Palpitations   (Consider location/radiation/quality/duration/timing/severity/associated sxs/prior Treatment) HPI Comments: 62 yo female with htn hx presents with chest palpitations accompanied by sob only during palpitations since 8 am.  Pt had one similar episode in the past.  No cardiac hx except palpitations, on b blocker for it and recently had it halved.  No current sob.  Patient denies blood clot history, active cancer, recent major trauma or surgery, unilateral leg swelling/ pain, recent long travel, hemoptysis or oral contraceptives.  Pt had a stress test recently, okay per pt.  ASA PTA.  No lung dz hx.  No cp or exertional sxs. No diaphoresis.  The history is provided by the patient.    Past Medical History  Diagnosis Date  . Diabetes mellitus   . Hypertension   . GERD (gastroesophageal reflux disease)   . Hypothyroidism   . Hyperlipidemia    Past Surgical History  Procedure Laterality Date  . Cholecystectomy    . Partial hysterectomy    . Appendectomy    . Knee arthroscopy      left   Family History  Problem Relation Age of Onset  . Heart disease Mother   . Heart disease Father   . Colon cancer Father   . Brain cancer Father   . Lung cancer Maternal Uncle     smoked  . Lung cancer Maternal Uncle     smoked   History  Substance Use Topics  . Smoking status: Never Smoker   . Smokeless tobacco: Never Used  . Alcohol Use: No   OB History   Grav Para Term Preterm Abortions TAB SAB Ect Mult Living                 Review of Systems  Constitutional: Negative for fever and chills.  HENT: Negative for congestion.   Eyes: Negative for visual disturbance.  Respiratory: Positive for shortness of breath.   Cardiovascular: Positive for palpitations. Negative for chest pain and leg swelling.   Gastrointestinal: Negative for vomiting and abdominal pain.  Genitourinary: Negative for dysuria and flank pain.  Musculoskeletal: Negative for back pain, neck pain and neck stiffness.  Skin: Negative for rash.  Neurological: Negative for light-headedness and headaches.    Allergies  Dilaudid  Home Medications   Current Outpatient Rx  Name  Route  Sig  Dispense  Refill  . Biotin 5000 MCG TABS   Oral   Take 5,000 mcg by mouth daily.          . Cholecalciferol (D3-1000) 1000 UNITS capsule   Oral   Take 1,000 Units by mouth daily.         . Coenzyme Q10 (CO Q 10) 100 MG CAPS   Oral   Take 100 mg by mouth daily.          Marland Kitchen esomeprazole (NEXIUM) 40 MG capsule   Oral   Take 40 mg by mouth daily before breakfast.         . FLAXSEED, LINSEED, PO   Oral   Take 1 tablet by mouth daily.         Marland Kitchen levothyroxine (SYNTHROID, LEVOTHROID) 88 MCG tablet   Oral   Take 88 mcg by mouth every evening.          Marland Kitchen lisinopril (PRINIVIL,ZESTRIL) 20  MG tablet   Oral   Take 10 mg by mouth Daily.          . metFORMIN (GLUCOPHAGE-XR) 500 MG 24 hr tablet   Oral   Take 500 mg by mouth 3 (three) times daily.          . metoprolol tartrate (LOPRESSOR) 25 MG tablet   Oral   Take 12.5 mg by mouth 2 (two) times daily.          . Multiple Vitamins-Minerals (OCUVITE PO)   Oral   Take 1 tablet by mouth daily.         . rosuvastatin (CRESTOR) 5 MG tablet   Oral   Take 5 mg by mouth daily.         Marland Kitchen spironolactone (ALDACTONE) 50 MG tablet   Oral   Take 50 mg by mouth daily.          BP 106/79  Pulse 79  Temp(Src) 98.3 F (36.8 C) (Oral)  Resp 12  SpO2 92% Physical Exam  Nursing note and vitals reviewed. Constitutional: She is oriented to person, place, and time. She appears well-developed and well-nourished.  HENT:  Head: Normocephalic and atraumatic.  Eyes: Conjunctivae are normal. Right eye exhibits no discharge. Left eye exhibits no discharge.  Neck:  Normal range of motion. Neck supple. No tracheal deviation present.  Cardiovascular: Normal rate, regular rhythm and intact distal pulses.   No murmur heard. Pulmonary/Chest: Effort normal and breath sounds normal.  Abdominal: Soft. She exhibits no distension. There is no tenderness. There is no guarding.  Musculoskeletal: She exhibits no edema and no tenderness.  Neurological: She is alert and oriented to person, place, and time.  Skin: Skin is warm. No rash noted.  Psychiatric: She has a normal mood and affect.    ED Course  Procedures (including critical care time) Labs Review Labs Reviewed  BASIC METABOLIC PANEL - Abnormal; Notable for the following:    Glucose, Bld 120 (*)    All other components within normal limits  CBC - Abnormal; Notable for the following:    WBC 11.1 (*)    All other components within normal limits  PRO B NATRIURETIC PEPTIDE  POCT I-STAT TROPONIN I   Imaging Review No results found.  EKG Interpretation     Ventricular Rate:  95 PR Interval:  173 QRS Duration: 86 QT Interval:  348 QTC Calculation: 437 R Axis:   -35 Text Interpretation:  Sinus rhythm Multiple ventricular premature complexes Inferior infarct, old Consider anterior infarct Similar to previous            MDM  No diagnosis found. Well appearing. No sxs in ED. PVC on EKG, similar to previous, no acute findings.   Discussed low risk cardiac and sxs not consistent with CAD, palpitations. Discussed close fup outpt for repeat stress test. Plan for delta troponin and pt will call pcp tomorrow.  Well appearing in ED, family with pt.  Results and differential diagnosis were discussed with the patient. Close follow up outpatient was discussed, patient comfortable with the plan.  No sxs on recheck.   Diagnosis: Palpitations, PVCs      Enid Skeens, MD 03/05/13 1640

## 2013-03-05 NOTE — Progress Notes (Signed)
  Subjective:    Patient ID: [name], female    DOB: 1951/02/20, 62 y.o.   MRN: [id number]  HPI Patient presents today with chief complaint of exertional dyspnea palpitations. Patient states that she's had persistent sensation of palpitations since this morning as well as having mild dyspnea is worse with exertion. No recent caffeine intake. Patient works as a Engineer, civil (consulting) and symptoms have been fairly persistent. Patient denies any exertional chest pain. No nausea or diaphoresis. No radiation of pain to the left shoulder or arm. Symptoms are predominantly mild dyspnea as well as palpitations. Patient states she had a workup of this about 10 years ago by cardiology but otherwise normal findings. Baseline history of reflux. Patient did have a greasy meal this morning the symptoms were present prior to breakfast. Symptoms are not similar to prior reflux flares The patient has a baseline history of hypertension and diabetes. Patient states that these diseases have been well-controlled. Patient denies any trauma.  Cardiovascular risk factors: Age, obesity, hypertension, diabetes, family history  Review of Systems  All other systems reviewed and are negative.       Objective:   Physical Exam  Constitutional:  Obese  HENT:  Head: Normocephalic and atraumatic.  Eyes: Conjunctivae are normal. Pupils are equal, round, and reactive to light.  Neck: Normal range of motion.  Cardiovascular: Normal rate, regular rhythm and normal heart sounds.   Pulmonary/Chest: Effort normal and breath sounds normal.  Abdominal: Soft.  Musculoskeletal: Normal range of motion.  Neurological: She is alert.  Skin: Skin is warm.   EKG: Sinus rhythm with questionable T wave flattening in the lateral leads in comparison with old EKG.       Assessment & Plan:  Palpitations - Plan: EKG 12-Lead  Exertional dyspnea  Differential diagnoses for symptoms is fairly broad including cardiac, GI, intrathoracic sources of  symptoms. Given age and multiple cardiovascular risk factors, patient will need formal cardiac rule out of symptoms. Blood sugar 112 today. Patient given full dose aspirin as well as nitroglycerin. No true symptomatic improvement of symptoms with nitroglycerin glycerin though the dyspnea has been on the exertional. Supplemental oxygen placed. Plan for transfer to the hospital via EMS for further evaluation symptoms. Discussed with patient that she will need to be ruled out from a cardiac standpoint including cardiac enzymes as well as chest x-ray. Patient and husband are agreeable to plan.  Greater than 50% to 60 minutes spent with patient in terms of direct care coordination and patient care

## 2013-03-07 ENCOUNTER — Other Ambulatory Visit: Payer: Self-pay

## 2013-03-21 ENCOUNTER — Encounter: Payer: BC Managed Care – PPO | Admitting: Cardiology

## 2013-03-23 ENCOUNTER — Encounter: Payer: Self-pay | Admitting: Interventional Cardiology

## 2013-03-26 ENCOUNTER — Ambulatory Visit (INDEPENDENT_AMBULATORY_CARE_PROVIDER_SITE_OTHER): Payer: BC Managed Care – PPO | Admitting: Interventional Cardiology

## 2013-03-26 ENCOUNTER — Encounter: Payer: Self-pay | Admitting: Interventional Cardiology

## 2013-03-26 VITALS — BP 130/88 | HR 72 | Ht 69.0 in | Wt 215.0 lb

## 2013-03-26 DIAGNOSIS — E1169 Type 2 diabetes mellitus with other specified complication: Secondary | ICD-10-CM | POA: Insufficient documentation

## 2013-03-26 DIAGNOSIS — R0602 Shortness of breath: Secondary | ICD-10-CM

## 2013-03-26 DIAGNOSIS — E669 Obesity, unspecified: Secondary | ICD-10-CM

## 2013-03-26 DIAGNOSIS — E782 Mixed hyperlipidemia: Secondary | ICD-10-CM

## 2013-03-26 DIAGNOSIS — R002 Palpitations: Secondary | ICD-10-CM

## 2013-03-26 NOTE — Patient Instructions (Addendum)
Your physician has requested that you have an exercise tolerance test. For further information please visit [email or web address]. Please also follow instruction sheet, as given.  Your physician has requested that you have an echocardiogram. Echocardiography is a painless test that uses sound waves to create images of your heart. It provides your doctor with information about the size and shape of your heart and how well your heart's chambers and valves are working. This procedure takes approximately one hour. There are no restrictions for this procedure.   

## 2013-03-26 NOTE — Progress Notes (Signed)
Patient ID: [name], female   DOB: Feb 13, 1951, 62 y.o.   MRN: [id number]     Patient ID: [name] MRN: [id number] DOB/AGE: [birth year] 62 y.o.   Referring Physician:   [name], FNP   Reason for Consultation [facility], palpitations, DM  HPI: 62 y/o who has a [place] bicuit on 11/4.  She developed severe indigestion and then palpitations. She is a Engineer, civil (consulting) and then had an ECG which showed some PVCs per her report.  She ended up going to the ER due to [facility].  EMS took her.  She ruled out for MI in the ER.  Her palpitations resolved.  She has felt well since that time.  She has been on long term antiHTN meds.  THey had been decreased in the past few weeks.  She had been having readings in the 90/60.  She saw Dr. Deborah Chalk in the past.  [facility] has had a normal stress test many years ago.  He managed her BP.  She has had toxemia post pregnancy.  She had high BP in the past.  She had tachycardia with Dr. Deborah Chalk and this was treated with beta blocker. She was told her heart was enlarged.  No CAD.  No AFib.  Since her trip to the ER, she has cut out caffeine and chocolate.  SHe increased metoprolol and palpitations are better.  No orthopnea, edema, PND.  No chest pain.  She has been diabetic for 9+ years.  Last stress test is within the last 10 years.   No siblings.     Current Outpatient Prescriptions  Medication Sig Dispense Refill  . Biotin 5000 MCG TABS Take 5,000 mcg by mouth daily.       . Cholecalciferol (D3-1000) 1000 UNITS capsule Take 1,000 Units by mouth daily.      . Coenzyme Q10 (CO Q 10) 100 MG CAPS Take 100 mg by mouth daily.       Marland Kitchen esomeprazole (NEXIUM) 40 MG capsule Take 40 mg by mouth daily before breakfast.      . FLAXSEED, LINSEED, PO Take 1 tablet by mouth daily.      Marland Kitchen levothyroxine (SYNTHROID, LEVOTHROID) 88 MCG tablet Take 88 mcg by mouth every evening.       Marland Kitchen lisinopril (PRINIVIL,ZESTRIL) 20 MG tablet Take 10 mg by mouth Daily.       . metFORMIN (GLUCOPHAGE-XR) 500 MG  24 hr tablet Take 500 mg by mouth 3 (three) times daily.       . metoprolol tartrate (LOPRESSOR) 25 MG tablet Take 12.5 mg by mouth 2 (two) times daily.       . Multiple Vitamins-Minerals (OCUVITE PO) Take 1 tablet by mouth daily.      . rosuvastatin (CRESTOR) 5 MG tablet Take 5 mg by mouth daily.      Marland Kitchen spironolactone (ALDACTONE) 50 MG tablet Take 50 mg by mouth daily.       No current facility-administered medications for this visit.   Past Medical History  Diagnosis Date  . Diabetes mellitus   . Hypertension   . GERD (gastroesophageal reflux disease)   . Hypothyroidism   . Hyperlipidemia     Family History  Problem Relation Age of Onset  . Heart disease Mother   . Heart disease Father   . Colon cancer Father   . Brain cancer Father   . Lung cancer Maternal Uncle     smoked  . Lung cancer Maternal Uncle     smoked  History   Social History  . Marital Status: Married    Spouse Name: N/A    Number of Children: 2  . Years of Education: N/A   Occupational History  . RN [employer]   Social History Main Topics  . Smoking status: Never Smoker   . Smokeless tobacco: Never Used  . Alcohol Use: No  . Drug Use: No  . Sexual Activity: Not on file   Other Topics Concern  . Not on file   Social History Narrative  . No narrative on file    Past Surgical History  Procedure Laterality Date  . Cholecystectomy    . Partial hysterectomy    . Appendectomy    . Knee arthroscopy      left      (Not in a hospital admission)  Review of systems complete and found to be negative unless listed above .  No nausea, vomiting.  No fever chills, No focal weakness,  No palpitations.  Physical Exam: Filed Vitals:   03/26/13 1019  BP: 130/88  Pulse: 72    Weight: 215 lb (97.523 kg)  Physical exam:  [place]/AT EOMI No JVD, No carotid bruit RRR S1S2  No wheezing Soft. NT, nondistended No edema. No focal motor or sensory deficits Normal affect  Labs:   Lab Results    Component Value Date   WBC 11.1* 03/05/2013   HGB 14.7 03/05/2013   HCT 42.8 03/05/2013   MCV 86.6 03/05/2013   PLT 261 03/05/2013   No results found for this basename: NA, K, CL, CO2, BUN, CREATININE, CALCIUM, LABALBU, PROT, BILITOT, ALKPHOS, ALT, AST, GLUCOSE,  in the last 168 hours No results found for this basename: CKTOTAL, CKMB, CKMBINDEX, TROPONINI    No results found for this basename: CHOL   No results found for this basename: HDL   No results found for this basename: LDLCALC   No results found for this basename: TRIG   No results found for this basename: CHOLHDL   No results found for this basename: LDLDIRECT      Radiology:  EKG:  NSR, left axis deviation; no significant ST segment changes  ASSESSMENT AND PLAN:   SHOB: Evaluate for structural heart disease with echocardiogram. May be multifactorial. She does have risk factors for coronary artery disease. We'll also plan for exercise treadmill test.  Not clear to me that she's really had an inferior MI. We'll see Forget motion on echocardiogram. She does have left axis deviation. This could be a left anterior fascicular block.  Palpitations: These are likely from PVCs which were caught on her ECG. She has never had any evidence of atrial fibrillation in the past.  Obesity: She is planning on going on a diet. She would benefit from weight loss. Her insurance costs we'll go up if she has a BMI over 30.  HTN:  Controlled today. Continue current dose of lisinopril and metoprolol.  Hyperlipidemia: Lipids reviewed and well controlled.    Signed:   Fredric Mare, MD, [facility] 03/26/2013, 10:36 AM

## 2013-04-02 ENCOUNTER — Ambulatory Visit (HOSPITAL_COMMUNITY)
Admission: RE | Admit: 2013-04-02 | Discharge: 2013-04-02 | Disposition: A | Discharge status: Home / Self Care | Payer: BC Managed Care – PPO | Source: Ambulatory Visit | Attending: Internal Medicine | Admitting: Internal Medicine

## 2013-04-02 ENCOUNTER — Telehealth: Payer: Self-pay | Admitting: *Deleted

## 2013-04-02 DIAGNOSIS — R0602 Shortness of breath: Secondary | ICD-10-CM

## 2013-04-02 DIAGNOSIS — R0609 Other forms of dyspnea: Secondary | ICD-10-CM | POA: Insufficient documentation

## 2013-04-02 DIAGNOSIS — R000 Tachycardia, unspecified: Secondary | ICD-10-CM | POA: Insufficient documentation

## 2013-04-02 DIAGNOSIS — E039 Hypothyroidism, unspecified: Secondary | ICD-10-CM | POA: Insufficient documentation

## 2013-04-02 DIAGNOSIS — I10 Essential (primary) hypertension: Secondary | ICD-10-CM | POA: Insufficient documentation

## 2013-04-02 DIAGNOSIS — E669 Obesity, unspecified: Secondary | ICD-10-CM | POA: Insufficient documentation

## 2013-04-02 DIAGNOSIS — E119 Type 2 diabetes mellitus without complications: Secondary | ICD-10-CM | POA: Insufficient documentation

## 2013-04-02 DIAGNOSIS — R0989 Other specified symptoms and signs involving the circulatory and respiratory systems: Secondary | ICD-10-CM | POA: Insufficient documentation

## 2013-04-02 DIAGNOSIS — R002 Palpitations: Secondary | ICD-10-CM | POA: Insufficient documentation

## 2013-04-02 NOTE — Telephone Encounter (Signed)
[name] had an Rx in the past for Alprazolam 0.5 mg for anxiety attacks.  The nurse practitioner at [facility] wrote it for her.  She got a bottle of 90 about 2 years ago, but did not ever use the whole thing.  Would like a new prescription.  Going through some stuff with her heart right now and is having difficulty sleeping at night and is just nervous.  She just takes 1/2 of the pill when she does take it.  Will you please refill?

## 2013-04-02 NOTE — Telephone Encounter (Signed)
This may be refilled x1 

## 2013-04-02 NOTE — Telephone Encounter (Signed)
Called into [facility] per dwm

## 2013-04-09 ENCOUNTER — Ambulatory Visit (HOSPITAL_COMMUNITY): Payer: BC Managed Care – PPO | Attending: Interventional Cardiology | Admitting: Cardiology

## 2013-04-09 DIAGNOSIS — E669 Obesity, unspecified: Secondary | ICD-10-CM | POA: Insufficient documentation

## 2013-04-09 DIAGNOSIS — Z6831 Body mass index (BMI) 31.0-31.9, adult: Secondary | ICD-10-CM | POA: Insufficient documentation

## 2013-04-09 DIAGNOSIS — R0609 Other forms of dyspnea: Secondary | ICD-10-CM | POA: Insufficient documentation

## 2013-04-09 DIAGNOSIS — I10 Essential (primary) hypertension: Secondary | ICD-10-CM | POA: Insufficient documentation

## 2013-04-09 DIAGNOSIS — E785 Hyperlipidemia, unspecified: Secondary | ICD-10-CM | POA: Insufficient documentation

## 2013-04-09 DIAGNOSIS — E119 Type 2 diabetes mellitus without complications: Secondary | ICD-10-CM | POA: Insufficient documentation

## 2013-04-09 DIAGNOSIS — R0989 Other specified symptoms and signs involving the circulatory and respiratory systems: Secondary | ICD-10-CM | POA: Insufficient documentation

## 2013-04-09 DIAGNOSIS — I059 Rheumatic mitral valve disease, unspecified: Secondary | ICD-10-CM | POA: Insufficient documentation

## 2013-04-09 DIAGNOSIS — R0602 Shortness of breath: Secondary | ICD-10-CM

## 2013-04-09 NOTE — Progress Notes (Signed)
Echo performed. 

## 2013-04-15 ENCOUNTER — Telehealth: Payer: Self-pay | Admitting: Interventional Cardiology

## 2013-04-15 NOTE — Telephone Encounter (Signed)
New message    [name] was supposed to call her last week after talking to Dr Eldridge Dace

## 2013-04-15 NOTE — Telephone Encounter (Signed)
Pt.notified

## 2013-04-15 NOTE — Telephone Encounter (Signed)
Per Dr. Eldridge Dace [place] motion on echo was normal therefore no prior MI.

## 2013-09-09 ENCOUNTER — Ambulatory Visit (INDEPENDENT_AMBULATORY_CARE_PROVIDER_SITE_OTHER): Payer: BC Managed Care – PPO

## 2013-09-09 ENCOUNTER — Ambulatory Visit (INDEPENDENT_AMBULATORY_CARE_PROVIDER_SITE_OTHER): Payer: BC Managed Care – PPO | Admitting: Family Medicine

## 2013-09-09 VITALS — BP 121/74 | HR 72 | Temp 97.9°F | Ht 69.0 in | Wt 213.2 lb

## 2013-09-09 DIAGNOSIS — M79671 Pain in right foot: Secondary | ICD-10-CM

## 2013-09-09 DIAGNOSIS — M79609 Pain in unspecified limb: Secondary | ICD-10-CM

## 2013-09-09 NOTE — Progress Notes (Signed)
   Subjective:    Patient ID: [name], female    DOB: [birth year], 63 y.o.   MRN: [id number]  HPI This 63 y.o. female presents for evaluation of right foot pain for a week.   Review of Systems C/o right foot pain No chest pain, SOB, HA, dizziness, vision change, N/V, diarrhea, constipation, dysuria, urinary urgency or frequency, myalgias, arthralgias or rash.     Objective:   Physical Exam Vital signs noted  Well developed well nourished female.  HEENT - Head atraumatic Normocephalic Respiratory - Lungs CTA bilateral Cardiac - RRR S1 and S2 without murmur Right foot - TTP plantar region of right foot  Xray right foot - No fracture     Assessment & Plan:  Right foot pain - Plan: DG Foot Complete Right Continue NSAIDs and if not better then refer to ortho  Deatra CanterWilliam J Oxford FNP

## 2013-12-11 ENCOUNTER — Ambulatory Visit (INDEPENDENT_AMBULATORY_CARE_PROVIDER_SITE_OTHER): Payer: BC Managed Care – PPO | Admitting: *Deleted

## 2013-12-11 DIAGNOSIS — Z23 Encounter for immunization: Secondary | ICD-10-CM

## 2013-12-11 NOTE — Patient Instructions (Signed)

## 2013-12-11 NOTE — Progress Notes (Signed)
Patient tolerated well.

## 2014-01-20 LAB — HM DIABETES EYE EXAM

## 2014-02-05 ENCOUNTER — Encounter: Payer: Self-pay | Admitting: *Deleted

## 2014-05-07 LAB — HEMOGLOBIN A1C: HEMOGLOBIN A1C: 7 % — AB (ref 4.0–6.0)

## 2014-07-24 ENCOUNTER — Ambulatory Visit (INDEPENDENT_AMBULATORY_CARE_PROVIDER_SITE_OTHER): Payer: BLUE CROSS/BLUE SHIELD | Admitting: Family Medicine

## 2014-07-24 VITALS — BP 140/89 | HR 70 | Temp 97.0°F | Ht 69.0 in | Wt 217.0 lb

## 2014-07-24 DIAGNOSIS — F418 Other specified anxiety disorders: Secondary | ICD-10-CM

## 2014-07-24 MED ORDER — DULOXETINE HCL 30 MG PO CPEP: 30.0000 mg | ORAL_CAPSULE | Freq: Every day | ORAL | Status: DC

## 2014-07-24 MED ORDER — CLONAZEPAM 0.5 MG PO TABS: 0.5000 mg | ORAL_TABLET | Freq: Two times a day (BID) | ORAL | Status: DC | PRN

## 2014-07-24 NOTE — Progress Notes (Signed)
Subjective:  Patient ID: [name] Lines, female    DOB: [birth year]  Age: 64 y.o. MRN: [id number]  CC: Hallucinations and nightmares   HPI [name] Belanger presents for increasing depression. Multiple circumstances have led to her feeling despondent. Currently she feels withdrawn and dysphoric.  History Olegario MessierKathy has a past medical history of Diabetes mellitus; Hypertension; GERD (gastroesophageal reflux disease); Hypothyroidism; and Hyperlipidemia.   She has past surgical history that includes Cholecystectomy; Partial hysterectomy; Appendectomy; and Knee arthroscopy.   Her family history includes Brain cancer in her father; Colon cancer in her father; Heart disease in her father and mother; Lung cancer in her maternal uncle and maternal uncle.She reports that she has never smoked. She has never used smokeless tobacco. She reports that she does not drink alcohol or use illicit drugs.  Current Outpatient Prescriptions on File Prior to Visit  Medication Sig Dispense Refill  . Biotin 5000 MCG TABS Take 5,000 mcg by mouth daily.     . Cholecalciferol (D3-1000) 1000 UNITS capsule Take 1,000 Units by mouth daily.    . Coenzyme Q10 (CO Q 10) 100 MG CAPS Take 100 mg by mouth daily.     Marland Kitchen. esomeprazole (NEXIUM) 40 MG capsule Take 40 mg by mouth daily before breakfast.    . FLAXSEED, LINSEED, PO Take 1 tablet by mouth daily.    Marland Kitchen. levothyroxine (SYNTHROID, LEVOTHROID) 88 MCG tablet Take 88 mcg by mouth every evening.     Marland Kitchen. lisinopril (PRINIVIL,ZESTRIL) 20 MG tablet Take 10 mg by mouth Daily.     . metFORMIN (GLUCOPHAGE-XR) 500 MG 24 hr tablet Take 500 mg by mouth 3 (three) times daily.     . metoprolol tartrate (LOPRESSOR) 25 MG tablet Take 25 mg by mouth 2 (two) times daily.     . Multiple Vitamins-Minerals (OCUVITE PO) Take 1 tablet by mouth daily.    . rosuvastatin (CRESTOR) 5 MG tablet Take 5 mg by mouth daily.    Marland Kitchen. spironolactone (ALDACTONE) 50 MG tablet Take 50 mg by mouth daily.     No current  facility-administered medications on file prior to visit.    ROS Review of Systems  Constitutional: Negative for fever, chills, diaphoresis, appetite change, fatigue and unexpected weight change.  HENT: Negative for congestion, ear pain, hearing loss, postnasal drip, rhinorrhea, sneezing, sore throat and trouble swallowing.   Eyes: Negative for pain.  Respiratory: Negative for cough, chest tightness and shortness of breath.   Cardiovascular: Negative for chest pain and palpitations.  Gastrointestinal: Negative for nausea, vomiting, abdominal pain, diarrhea and constipation.  Genitourinary: Negative for dysuria, frequency and menstrual problem.  Musculoskeletal: Negative for joint swelling and arthralgias.  Skin: Negative for rash.  Neurological: Negative for dizziness, weakness, numbness and headaches.  Psychiatric/Behavioral: Positive for hallucinations, behavioral problems, confusion, sleep disturbance, dysphoric mood, decreased concentration and agitation. Negative for suicidal ideas.    Objective:  BP 140/89 mmHg  Pulse 70  Temp(Src) 97 F (36.1 C) (Oral)  Ht 5\' 9"  (1.753 m)  Wt 217 lb (98.431 kg)  BMI 32.03 kg/m2  BP Readings from Last 3 Encounters:  07/24/14 140/89  09/09/13 121/74  03/26/13 130/88    Wt Readings from Last 3 Encounters:  07/24/14 217 lb (98.431 kg)  09/09/13 213 lb 3.2 oz (96.707 kg)  03/26/13 215 lb (97.523 kg)     Physical Exam  Constitutional: She is oriented to person, place, and time. She appears well-developed and well-nourished. No distress.  HENT:  Head: Normocephalic and atraumatic.  Right Ear: External ear normal.  Left Ear: External ear normal.  Nose: Nose normal.  Mouth/Throat: Oropharynx is clear and moist.  Eyes: Conjunctivae and EOM are normal. Pupils are equal, round, and reactive to light.  Neck: Normal range of motion. Neck supple. No thyromegaly present.  Cardiovascular: Normal rate, regular rhythm and normal heart sounds.     No murmur heard. Pulmonary/Chest: Effort normal and breath sounds normal. No respiratory distress. She has no wheezes. She has no rales.  Abdominal: Soft. Bowel sounds are normal. She exhibits no distension. There is no tenderness.  Lymphadenopathy:    She has no cervical adenopathy.  Neurological: She is alert and oriented to person, place, and time. She has normal reflexes.  Skin: Skin is warm and dry.  Psychiatric: She has a normal mood and affect. Her behavior is normal. Judgment and thought content normal.    No results found for: HGBA1C  Lab Results  Component Value Date   WBC 11.1* 03/05/2013   HGB 14.7 03/05/2013   HCT 42.8 03/05/2013   PLT 261 03/05/2013   GLUCOSE 120* 03/05/2013   NA 137 03/05/2013   K 4.1 03/05/2013   CL 101 03/05/2013   CREATININE 0.58 03/05/2013   BUN 11 03/05/2013   CO2 24 03/05/2013    No results found.  Assessment & Plan:   There are no diagnoses linked to this encounter. I am having Ms. Hagmann start on DULoxetine and clonazePAM. I am also having her maintain her spironolactone, esomeprazole, metoprolol tartrate, rosuvastatin, metFORMIN, Co Q 10, (FLAXSEED, LINSEED, PO), Biotin, Multiple Vitamins-Minerals (OCUVITE PO), Cholecalciferol, lisinopril, and levothyroxine.  Meds ordered this encounter  Medications  . DULoxetine (CYMBALTA) 30 MG capsule    Sig: Take 1 capsule (30 mg total) by mouth daily. For one week then two daily. Take with a full stomach at suppertime    Dispense:  60 capsule    Refill:  0  . clonazePAM (KLONOPIN) 0.5 MG tablet    Sig: Take 1 tablet (0.5 mg total) by mouth 2 (two) times daily as needed for anxiety.    Dispense:  60 tablet    Refill:  1     Follow-up: Return in about 2 weeks (around 08/07/2014).  Mechele Claude, M.D.

## 2014-07-24 NOTE — Patient Instructions (Signed)
Call [facility] for counseling TODAY!

## 2014-07-29 ENCOUNTER — Telehealth: Payer: Self-pay | Admitting: Family Medicine

## 2014-07-29 NOTE — Telephone Encounter (Signed)
I would like to see her to decide on alternative med for her.  Please ask her to set up an appt.. Thanks, WS.

## 2014-07-30 NOTE — Telephone Encounter (Signed)
Find

## 2014-07-30 NOTE — Telephone Encounter (Signed)
Pt is going to finish sessions with counselor then will make a follow-up appt with Dr. Darlyn ReadStacks. In regards to her medicines, taking both cymbalta and klonopin are keeping her like a zombie and she is not able to function at work like this. She is stopping the cymbalta and taking a half dose of the klonopin.

## 2014-07-30 NOTE — Telephone Encounter (Signed)
Treatment with the proper medication is an integral part of therapy. Counseling without medication is not as likely to be successful. Consider follow up to consider new medication soon.

## 2014-08-06 NOTE — Telephone Encounter (Signed)
Patient was seen by stacks and started on cymbalta

## 2014-09-01 ENCOUNTER — Ambulatory Visit: Payer: BLUE CROSS/BLUE SHIELD | Admitting: Family

## 2014-09-10 ENCOUNTER — Ambulatory Visit (INDEPENDENT_AMBULATORY_CARE_PROVIDER_SITE_OTHER): Payer: BLUE CROSS/BLUE SHIELD | Admitting: Family

## 2014-09-10 ENCOUNTER — Encounter: Payer: Self-pay | Admitting: Family

## 2014-09-10 VITALS — BP 111/77 | HR 80 | Temp 97.5°F | Ht 69.0 in | Wt 217.0 lb

## 2014-09-10 DIAGNOSIS — F411 Generalized anxiety disorder: Secondary | ICD-10-CM | POA: Diagnosis not present

## 2014-09-10 DIAGNOSIS — E1165 Type 2 diabetes mellitus with hyperglycemia: Secondary | ICD-10-CM

## 2014-09-10 DIAGNOSIS — E782 Mixed hyperlipidemia: Secondary | ICD-10-CM | POA: Diagnosis not present

## 2014-09-10 DIAGNOSIS — E039 Hypothyroidism, unspecified: Secondary | ICD-10-CM | POA: Diagnosis not present

## 2014-09-10 DIAGNOSIS — E559 Vitamin D deficiency, unspecified: Secondary | ICD-10-CM

## 2014-09-10 DIAGNOSIS — K219 Gastro-esophageal reflux disease without esophagitis: Secondary | ICD-10-CM

## 2014-09-10 DIAGNOSIS — E119 Type 2 diabetes mellitus without complications: Secondary | ICD-10-CM | POA: Insufficient documentation

## 2014-09-10 MED ORDER — ALPRAZOLAM 0.5 MG PO TABS: 0.5000 mg | ORAL_TABLET | Freq: Every evening | ORAL | Status: DC | PRN

## 2014-09-10 NOTE — Patient Instructions (Signed)

## 2014-09-10 NOTE — Progress Notes (Signed)
Subjective:    Patient ID: [name], female    DOB: 1951/02/08, 64 y.o.   MRN: [id number]  Diabetes She presents for her follow-up diabetic visit. She has type 2 diabetes mellitus. Her disease course has been stable. Hypoglycemia symptoms include nervousness/anxiousness. Pertinent negatives for hypoglycemia include no confusion, dizziness, headaches, mood changes or sleepiness. Associated symptoms include foot paresthesias. Pertinent negatives for diabetes include no blurred vision, no foot ulcerations and no visual change. Pertinent negatives for hypoglycemia complications include no blackouts and no hospitalization. Symptoms are stable. Diabetic complications include peripheral neuropathy. Pertinent negatives for diabetic complications include no CVA, heart disease or nephropathy. Risk factors for coronary artery disease include diabetes mellitus, dyslipidemia, hypertension, obesity, post-menopausal and sedentary lifestyle. Current diabetic treatment includes oral agent (monotherapy). She is compliant with treatment all of the time. She is following a generally healthy diet. Her breakfast blood glucose range is generally 110-130 mg/dl. An ACE inhibitor/angiotensin II receptor blocker is being taken. Eye exam is current.  Hypertension This is a chronic problem. The current episode started more than 1 year ago. The problem has been resolved since onset. The problem is controlled. Associated symptoms include anxiety. Pertinent negatives include no blurred vision, headaches, palpitations, peripheral edema or shortness of breath. Risk factors for coronary artery disease include diabetes mellitus, dyslipidemia, family history, obesity, post-menopausal state and sedentary lifestyle. Past treatments include ACE inhibitors and beta blockers. The current treatment provides moderate improvement. Hypertensive end-organ damage includes a thyroid problem. There is no history of kidney disease, CAD/MI, CVA or heart  failure.  Hyperlipidemia This is a chronic problem. The current episode started more than 1 year ago. The problem is controlled. Recent lipid tests were reviewed and are normal. She has no history of diabetes or hypothyroidism. Pertinent negatives include no leg pain or shortness of breath. Current antihyperlipidemic treatment includes statins. The current treatment provides moderate improvement of lipids. Risk factors for coronary artery disease include dyslipidemia, family history, hypertension, obesity and post-menopausal.  Thyroid Problem Presents for follow-up visit. Symptoms include anxiety. Patient reports no depressed mood, diarrhea, dry skin, hoarse voice, palpitations or visual change. The symptoms have been stable. Past treatments include levothyroxine. The treatment provided significant relief. Her past medical history is significant for hyperlipidemia. There is no history of diabetes or heart failure.  Gastrophageal Reflux She reports no belching, no coughing, no heartburn, no hoarse voice or no sore throat. This is a chronic problem. The current episode started more than 1 year ago. The problem occurs rarely. The problem has been resolved. The symptoms are aggravated by certain foods. She has tried a PPI for the symptoms. The treatment provided significant relief.  Anxiety Presents for follow-up visit. Onset was 1 to 6 months ago. The problem has been waxing and waning. Symptoms include excessive worry and nervous/anxious behavior. Patient reports no confusion, depressed mood, dizziness, insomnia, irritability, palpitations or shortness of breath. Symptoms occur rarely. The severity of symptoms is mild. The symptoms are aggravated by work stress. The quality of sleep is good.   Her past medical history is significant for anxiety/panic attacks. There is no history of depression. Past treatments include benzodiazephines. The treatment provided significant relief. Compliance with prior  treatments has been good.      Review of Systems  Constitutional: Negative.  Negative for irritability.  HENT: Negative.  Negative for hoarse voice and sore throat.   Eyes: Negative.  Negative for blurred vision.  Respiratory: Negative.  Negative for cough and shortness of  breath.   Cardiovascular: Negative.  Negative for palpitations.  Gastrointestinal: Negative.  Negative for heartburn and diarrhea.  Endocrine: Negative.   Genitourinary: Negative.   Musculoskeletal: Negative.   Neurological: Negative.  Negative for dizziness and headaches.  Hematological: Negative.   Psychiatric/Behavioral: Negative for confusion. The patient is nervous/anxious. The patient does not have insomnia.   All other systems reviewed and are negative.      Objective:   Physical Exam  Constitutional: She is oriented to person, place, and time. She appears well-developed and well-nourished. No distress.  HENT:  Head: Normocephalic and atraumatic.  Right Ear: External ear normal.  Left Ear: External ear normal.  Nose: Nose normal.  Mouth/Throat: Oropharynx is clear and moist.  Eyes: Pupils are equal, round, and reactive to light.  Neck: Normal range of motion. Neck supple. No thyromegaly present.  Cardiovascular: Normal rate, regular rhythm, normal heart sounds and intact distal pulses.   No murmur heard. Pulmonary/Chest: Effort normal and breath sounds normal. No respiratory distress. She has no wheezes.  Abdominal: Soft. Bowel sounds are normal. She exhibits no distension. There is no tenderness.  Musculoskeletal: Normal range of motion. She exhibits no edema or tenderness.  Neurological: She is alert and oriented to person, place, and time. She has normal reflexes. No cranial nerve deficit.  Skin: Skin is warm and dry.  Psychiatric: She has a normal mood and affect. Her behavior is normal. Judgment and thought content normal.  Vitals reviewed.   See Diabetic foot note  BP 111/77 mmHg  Pulse  80  Temp(Src) 97.5 F (36.4 C) (Oral)  Ht 5\' 9"  (1.753 m)  Wt 217 lb (98.431 kg)  BMI 32.03 kg/m2     Assessment & Plan:  1. Mixed hyperlipidemia  2. Hypothyroidism, unspecified hypothyroidism type  3. Gastroesophageal reflux disease, esophagitis presence not specified  4. Vitamin D deficiency  5. Type 2 diabetes mellitus with hyperglycemia  6. GAD (generalized anxiety disorder) -Stress management discussed  ALPRAZolam (XANAX) 0.5 MG tablet; Take 1 tablet (0.5 mg total) by mouth at bedtime as needed for anxiety.  Dispense: 30 tablet; Refill: 3   Continue all meds Labs drawn at work- Pt states she will fax results to use Health Maintenance reviewed Diet and exercise encouraged RTO 6 months  Jannifer Rodneyhristy Simon [place], FNP

## 2014-09-22 ENCOUNTER — Other Ambulatory Visit: Payer: Self-pay | Admitting: Gastroenterology

## 2014-09-22 DIAGNOSIS — R1011 Right upper quadrant pain: Secondary | ICD-10-CM

## 2014-10-09 ENCOUNTER — Ambulatory Visit
Admission: RE | Admit: 2014-10-09 | Discharge: 2014-10-09 | Disposition: A | Discharge status: Home / Self Care | Payer: BLUE CROSS/BLUE SHIELD | Source: Ambulatory Visit | Attending: Gastroenterology | Admitting: Gastroenterology

## 2014-10-09 DIAGNOSIS — R1011 Right upper quadrant pain: Secondary | ICD-10-CM

## 2014-10-09 MED ORDER — GADOBENATE DIMEGLUMINE 529 MG/ML IV SOLN
20.0000 mL | Freq: Once | INTRAVENOUS | Status: AC | PRN
  Administered 2014-10-09: 20 mL via INTRAVENOUS

## 2014-12-29 ENCOUNTER — Ambulatory Visit (INDEPENDENT_AMBULATORY_CARE_PROVIDER_SITE_OTHER): Payer: BLUE CROSS/BLUE SHIELD | Admitting: Family Medicine

## 2014-12-29 ENCOUNTER — Encounter: Payer: Self-pay | Admitting: Family Medicine

## 2014-12-29 ENCOUNTER — Telehealth: Payer: Self-pay | Admitting: Family Medicine

## 2014-12-29 VITALS — BP 118/80 | HR 85 | Temp 97.2°F | Ht 69.0 in | Wt 195.0 lb

## 2014-12-29 DIAGNOSIS — F32A Depression, unspecified: Secondary | ICD-10-CM

## 2014-12-29 DIAGNOSIS — R634 Abnormal weight loss: Secondary | ICD-10-CM

## 2014-12-29 DIAGNOSIS — F329 Major depressive disorder, single episode, unspecified: Secondary | ICD-10-CM | POA: Diagnosis not present

## 2014-12-29 DIAGNOSIS — F411 Generalized anxiety disorder: Secondary | ICD-10-CM | POA: Diagnosis not present

## 2014-12-29 MED ORDER — ESCITALOPRAM OXALATE 10 MG PO TABS: 10.0000 mg | ORAL_TABLET | Freq: Every day | ORAL | Status: DC

## 2014-12-29 NOTE — Progress Notes (Signed)
Subjective:    Patient ID: [name], female    DOB: 1950-07-26, 64 y.o.   MRN: [id number]  HPI Pt here for anxiety and life stress. The patient has recently separated from her husband. They have been married for 12 years. This is been a very stressful and anxiety ridden situation. She is having trouble dealing with this. She is taking Xanax at bedtime and has been for several weeks. She is tearful and crying and relating to me on multiple occasions how can he do this to her.       Patient Active Problem List   Diagnosis Date Noted  . Hypothyroidism 09/10/2014  . GERD (gastroesophageal reflux disease) 09/10/2014  . Vitamin D deficiency 09/10/2014  . Diabetes mellitus, type 2 09/10/2014  . GAD (generalized anxiety disorder) 09/10/2014  . Obesity, unspecified 03/26/2013  . Mixed hyperlipidemia 03/26/2013  . Pulmonary nodule 09/22/2011  . Cough 09/22/2011  . Hypertension 09/22/2011   Outpatient Encounter Prescriptions as of 12/29/2014  Medication Sig  . ALPRAZolam (XANAX) 0.5 MG tablet Take 1 tablet (0.5 mg total) by mouth at bedtime as needed for anxiety.  . Biotin 5000 MCG TABS Take 5,000 mcg by mouth daily.   . Cholecalciferol (D3-1000) 1000 UNITS capsule Take 1,000 Units by mouth daily.  . Coenzyme Q10 (CO Q 10) 100 MG CAPS Take 100 mg by mouth daily.   Marland Kitchen esomeprazole (NEXIUM) 40 MG capsule Take 40 mg by mouth daily before breakfast.  . FLAXSEED, LINSEED, PO Take 1 tablet by mouth daily.  Marland Kitchen levothyroxine (SYNTHROID, LEVOTHROID) 88 MCG tablet Take 88 mcg by mouth every evening.   Marland Kitchen lisinopril (PRINIVIL,ZESTRIL) 20 MG tablet Take 10 mg by mouth Daily.   . metFORMIN (GLUCOPHAGE-XR) 500 MG 24 hr tablet Take 500 mg by mouth 3 (three) times daily.   . metoprolol tartrate (LOPRESSOR) 25 MG tablet Take 25 mg by mouth 2 (two) times daily.   . rosuvastatin (CRESTOR) 5 MG tablet Take 5 mg by mouth daily.  Marland Kitchen spironolactone (ALDACTONE) 50 MG tablet Take by mouth daily. Take 76m daily     No facility-administered encounter medications on file as of 12/29/2014.      Review of Systems  Constitutional: Negative.   HENT: Negative.   Eyes: Negative.   Respiratory: Negative.   Cardiovascular: Negative.   Gastrointestinal: Negative.   Endocrine: Negative.   Genitourinary: Negative.   Musculoskeletal: Negative.   Skin: Negative.   Allergic/Immunologic: Negative.   Neurological: Negative.   Hematological: Negative.   Psychiatric/Behavioral: The patient is nervous/anxious.        Objective:   Physical Exam  Constitutional: She is oriented to person, place, and time. She appears well-developed and well-nourished. She appears distressed.  HENT:  Head: Normocephalic and atraumatic.  Musculoskeletal: Normal range of motion.  Neurological: She is alert and oriented to person, place, and time.  Skin: Skin is warm. No rash noted.  Psychiatric: Her behavior is normal. Judgment and thought content normal.  The patient is emotionally distressed. She is tearful and crying. She was encouraged to believe that this is the right thing that further stress of living with her husband would be worse for her and being separated from him. She believes this. She is continuing to work.  Nursing note and vitals reviewed.  BP 148/92 mmHg  Pulse 85  Temp(Src) 97.2 F (36.2 C) (Oral)  Ht '5\' 9"'  (1.753 m)  Wt 195 lb (88.451 kg)  BMI 28.78 kg/m2  Repeat blood pressure was  118/80. About 30 minutes of time was spent discussing the situation with her and she agreed to take an antidepressant which we will start soon.      Assessment & Plan:  1. Loss of weight -Eat healthy and continue to monitor blood sugars closely and check blood pressures. - POCT glycosylated hemoglobin (Hb A1C) - BMP8+EGFR - Hepatic function panel - CBC with Differential/Platelet  2. Anxiety state -Start Lexapro 10 mg 1 daily - POCT glycosylated hemoglobin (Hb A1C) - BMP8+EGFR - Hepatic function panel - CBC  with Differential/Platelet - escitalopram (LEXAPRO) 10 MG tablet; Take 1 tablet (10 mg total) by mouth daily.  Dispense: 30 tablet; Refill: 3  3. Depression -Start Lexapro 10 mg 1 daily and return to clinic for recheck in 3 weeks  Meds ordered this encounter  Medications  . escitalopram (LEXAPRO) 10 MG tablet    Sig: Take 1 tablet (10 mg total) by mouth daily.    Dispense:  30 tablet    Refill:  3   . Patient Instructions  The patient should continue to take the Xanax as she is doing currently and maybe may try a fourth of one during the day if needed She should start the Lexapro 1 daily 10 mg She should avoid caffeine She should be encouraged to take his good care for self as possible and seek counseling if necessary We will recheck her in about 3 weeks   Arrie Senate MD

## 2014-12-29 NOTE — Patient Instructions (Signed)
The patient should continue to take the Xanax as she is doing currently and maybe may try a fourth of one during the day if needed She should start the Lexapro 1 daily 10 mg She should avoid caffeine She should be encouraged to take his good care for self as possible and seek counseling if necessary We will recheck her in about 3 weeks

## 2014-12-29 NOTE — Telephone Encounter (Signed)
At made

## 2015-01-21 ENCOUNTER — Ambulatory Visit (INDEPENDENT_AMBULATORY_CARE_PROVIDER_SITE_OTHER): Payer: BLUE CROSS/BLUE SHIELD | Admitting: Family Medicine

## 2015-01-21 ENCOUNTER — Encounter: Payer: Self-pay | Admitting: Family Medicine

## 2015-01-21 VITALS — BP 110/72 | HR 70 | Temp 97.8°F | Ht 69.0 in | Wt 195.0 lb

## 2015-01-21 DIAGNOSIS — F32A Depression, unspecified: Secondary | ICD-10-CM

## 2015-01-21 DIAGNOSIS — F4323 Adjustment disorder with mixed anxiety and depressed mood: Secondary | ICD-10-CM | POA: Diagnosis not present

## 2015-01-21 DIAGNOSIS — F329 Major depressive disorder, single episode, unspecified: Secondary | ICD-10-CM

## 2015-01-21 NOTE — Patient Instructions (Signed)
The patient should continue with her current treatment with the Xanax at bedtime and the Lexapro in the morning We will see her back in a couple months and if there is any problems or issues before that time she should come back and get in touch with [place]

## 2015-01-21 NOTE — Progress Notes (Signed)
Subjective:    Patient ID: [name], female    DOB: [birth year], 64 y.o.   MRN: [id number]  HPI Patient here today for 3 week follow up in anxiety and depression. She is doing better. The patient has been taking Lexapro 10 mg daily. She has Xanax to take as needed.       Patient Active Problem List   Diagnosis Date Noted  . Hypothyroidism 09/10/2014  . GERD (gastroesophageal reflux disease) 09/10/2014  . Vitamin D deficiency 09/10/2014  . Diabetes mellitus, type 2 09/10/2014  . GAD (generalized anxiety disorder) 09/10/2014  . Obesity, unspecified 03/26/2013  . Mixed hyperlipidemia 03/26/2013  . Pulmonary nodule 09/22/2011  . Cough 09/22/2011  . Hypertension 09/22/2011   Outpatient Encounter Prescriptions as of 01/21/2015  Medication Sig  . ALPRAZolam (XANAX) 0.5 MG tablet Take 1 tablet (0.5 mg total) by mouth at bedtime as needed for anxiety.  . Biotin 5000 MCG TABS Take 5,000 mcg by mouth daily.   . Cholecalciferol (D3-1000) 1000 UNITS capsule Take 1,000 Units by mouth daily.  . Coenzyme Q10 (CO Q 10) 100 MG CAPS Take 100 mg by mouth daily.   Marland Kitchen escitalopram (LEXAPRO) 10 MG tablet Take 1 tablet (10 mg total) by mouth daily.  Marland Kitchen esomeprazole (NEXIUM) 40 MG capsule Take 40 mg by mouth daily before breakfast.  . FLAXSEED, LINSEED, PO Take 1 tablet by mouth daily.  Marland Kitchen levothyroxine (SYNTHROID, LEVOTHROID) 88 MCG tablet Take 88 mcg by mouth every evening.   Marland Kitchen lisinopril (PRINIVIL,ZESTRIL) 20 MG tablet Take 10 mg by mouth Daily.   . metFORMIN (GLUCOPHAGE-XR) 500 MG 24 hr tablet Take 500 mg by mouth 3 (three) times daily.   . metoprolol tartrate (LOPRESSOR) 25 MG tablet Take 25 mg by mouth 2 (two) times daily.   . rosuvastatin (CRESTOR) 5 MG tablet Take 5 mg by mouth daily.  Marland Kitchen spironolactone (ALDACTONE) 50 MG tablet Take by mouth daily. Take  daily   No facility-administered encounter medications on file as of 01/21/2015.     Review of Systems  Constitutional: Negative.     HENT: Negative.   Eyes: Negative.   Respiratory: Negative.   Cardiovascular: Negative.   Gastrointestinal: Negative.   Endocrine: Negative.   Genitourinary: Negative.   Musculoskeletal: Negative.   Skin: Negative.   Allergic/Immunologic: Negative.   Neurological: Negative.   Hematological: Negative.   Psychiatric/Behavioral: The patient is nervous/anxious (doing better).        Objective:   Physical Exam BP 110/72 mmHg  Pulse 70  Temp(Src) 97.8 F (36.6 C) (Oral)  Ht  (1.753 m)  Wt 195 lb (88.451 kg)  BMI 28.78 kg/m2 The patient is doing much better. She has a much more positive demeanor on talking with her. She is working through the family issues that a dense so hurtful over the past several weeks. I think the medicine has helped this. She is repositioning herself to move on knowing that she will still have times when there be increased anxiety Greater than 25 minutes of time were spent discussing what has transpired since the last visit where she is headed for the future.       Assessment & Plan:  1. Depression -Improved with Lexapro and she will continue with this.  2. Situational mixed anxiety and depressive disorder -This is improved with the Lexapro and the Xanax at bedtime.  Patient Instructions  The patient should continue with her current treatment with the Xanax at bedtime and the Lexapro  in the morning We will see her back in a couple months and if there is any problems or issues before that time she should come back and get in touch with [place]   Nyra Capes MD

## 2015-02-17 ENCOUNTER — Other Ambulatory Visit: Payer: Self-pay | Admitting: *Deleted

## 2015-02-17 DIAGNOSIS — F411 Generalized anxiety disorder: Secondary | ICD-10-CM

## 2015-02-17 MED ORDER — ALPRAZOLAM 0.5 MG PO TABS: 0.5000 mg | ORAL_TABLET | Freq: Every evening | ORAL | Status: DC | PRN

## 2015-03-03 LAB — HM DIABETES EYE EXAM

## 2015-03-10 ENCOUNTER — Encounter: Payer: Self-pay | Admitting: *Deleted

## 2015-06-01 ENCOUNTER — Ambulatory Visit (INDEPENDENT_AMBULATORY_CARE_PROVIDER_SITE_OTHER): Payer: BLUE CROSS/BLUE SHIELD | Admitting: Family Medicine

## 2015-06-01 ENCOUNTER — Encounter: Payer: Self-pay | Admitting: Family Medicine

## 2015-06-01 ENCOUNTER — Ambulatory Visit (INDEPENDENT_AMBULATORY_CARE_PROVIDER_SITE_OTHER): Payer: BLUE CROSS/BLUE SHIELD

## 2015-06-01 VITALS — BP 114/78 | HR 73 | Temp 97.7°F | Ht 69.0 in | Wt 205.2 lb

## 2015-06-01 DIAGNOSIS — M79671 Pain in right foot: Secondary | ICD-10-CM

## 2015-06-01 DIAGNOSIS — M6530 Trigger finger, unspecified finger: Secondary | ICD-10-CM | POA: Diagnosis not present

## 2015-06-01 NOTE — Patient Instructions (Signed)
Great to meet you!  Let me know if the pain is persistent, we could always push harder for more exact diagnosis and refer to a sports medicine doctor.   It sounds simlar to tarsal tunnel syndrome.

## 2015-06-01 NOTE — Progress Notes (Signed)
   HPI  Patient presents today here with foot pain and left hand pain.  Right foot pain Patient [name] last 3 months or so she's developed right forefoot pain that radiates around to her right medial foot. She describes that last year she broke her foot which was treated with a Cam Walker. She feels like she may be developing a bone spur in an unusual place causing nerve pain. No problems walking It's keeping her up at night and has increased sensation whenever the bed sheets rubbing process.  Trigger finger of the left fourth finger over the last one month. 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 She is using NSAIDs and ice to cope with it. She does not want intervention at this time She's had carpal tunnel surgery 2   PMH: Smoking status noted ROS: Per HPI  Objective: BP 114/78 mmHg  Pulse 73  Temp(Src) 97.7 F (36.5 C) (Oral)  Ht  (1.753 m)  Wt 205 lb 3.2 oz (93.078 kg)  BMI 30.29 kg/m2 Gen: NAD, alert, cooperative with exam HEENT: NCAT Ext:  Right medial foot with no tenderness to palpation of the medial or lateral ankle ligaments, no bony tenderness of the metatarsals, no joint laxity. Normal gait Neuro: Alert and oriented, No gross deficits  Plain film with possible small bone spur over the medial midfoot on the dorsal aspect Otherwise no acute findings  Assessment and plan:  # Medial foot pain X-rays normal today Consider tarsal tunnel syndrome Continue NSAIDs for trigger finger, consider rest ice compression and elevation Consider nerve irritation as it passes over the possible small bone spur as well Consider sports medicine referral Rescan him to 300 mg TID times a day  # Trigger finger Discussed supportive care Continue ice and NSAIDs Perform injection versus referral to sports medicine if she has persistent  symptoms    Orders Placed This Encounter  Procedures  . DG Foot Complete Right    Standing Status: Future     Number of Occurrences: 1     Standing Expiration Date: 07/29/2016    Order Specific Question:  Reason for Exam (SYMPTOM  OR DIAGNOSIS REQUIRED)    Answer:  medial foot pain, Fx last year    Order Specific Question:  Preferred imaging location?    Answer:  Internal    No orders of the defined types were placed in this encounter.    Murtis Sink, MD [facility] 06/01/2015, 2:50 PM

## 2015-06-05 ENCOUNTER — Encounter: Payer: Self-pay | Admitting: Family Medicine

## 2015-06-05 NOTE — Telephone Encounter (Signed)
Correction via the patient.   The injured foot was the L foot. X rays appear to be the L foot and my note indicates R foot symptoms, however this is a mistake (pointed out by the patient).   Murtis Sink, MD [facility] 06/05/2015, 9:23 AM

## 2015-08-03 ENCOUNTER — Telehealth: Payer: Self-pay | Admitting: Family Medicine

## 2015-08-10 DIAGNOSIS — L03211 Cellulitis of face: Secondary | ICD-10-CM | POA: Diagnosis not present

## 2015-08-10 DIAGNOSIS — H109 Unspecified conjunctivitis: Secondary | ICD-10-CM | POA: Diagnosis not present

## 2015-08-17 ENCOUNTER — Encounter: Payer: Self-pay | Admitting: Family Medicine

## 2015-08-17 ENCOUNTER — Ambulatory Visit (INDEPENDENT_AMBULATORY_CARE_PROVIDER_SITE_OTHER): Payer: BLUE CROSS/BLUE SHIELD | Admitting: Family Medicine

## 2015-08-17 ENCOUNTER — Ambulatory Visit (INDEPENDENT_AMBULATORY_CARE_PROVIDER_SITE_OTHER): Payer: BLUE CROSS/BLUE SHIELD

## 2015-08-17 VITALS — BP 116/79 | HR 82 | Temp 98.2°F | Ht 69.0 in | Wt 209.4 lb

## 2015-08-17 DIAGNOSIS — M79671 Pain in right foot: Secondary | ICD-10-CM

## 2015-08-17 DIAGNOSIS — M79672 Pain in left foot: Secondary | ICD-10-CM | POA: Diagnosis not present

## 2015-08-17 MED ORDER — GABAPENTIN 300 MG PO CAPS: ORAL_CAPSULE | ORAL | Status: DC

## 2015-08-17 NOTE — Progress Notes (Signed)
   HPI  Patient presents today here with bilateral foot pain. Left foot pain Seen for left hip pain in January. She had pins and needle type pain across the forefoot at that time, it has continued. Gabapentin helping, however her sleep is still disturbed and she cannot tolerate the afternoon dose due to lightheadedness. Increased pain, no reinjury. However she does continue to have swelling and tenderness at the area occasionally.  Right foot pain 2 months ago she accidentally kicked a chair splitting her fourth and fifth toes She had pain and swelling as well as bruising at that time. Since that time she has continued to have some pain and intermittent bruising. She states that the pain has improved but not completely resolved yet.    Sleeping is difficult. She takes NSAIDs, gabapentin, and occasional Xanax and still has difficulty sleeping. She feels the pain is the key limiting factor for her sleep, bed sheets moving across her left foot causes severe pain  [facility]    PMH: Smoking status noted ROS: Per HPI  Objective: BP 116/79 mmHg  Pulse 82  Temp(Src) 98.2 F (36.8 C) (Oral)  Ht 5\' 9"  (1.753 m)  Wt 209 lb 6.4 oz (94.983 kg)  BMI 30.91 kg/m2 Gen: NAD, alert, cooperative with exam HEENT: NCAT CV: RRR, good S1/S2, no murmur Resp: CTABL, no wheezes, non-labored Ext:  Left foot with mild tenderness to palpation of the first metatarsal, no gross deformity, erythema, or swelling. Right foot - tenderness to palpation of the distal portion of the proximal 5th phalanges, no erythema, swelling, bruising, or gross deformity Neuro: Alert and oriented, No gross deficits  DG left foot- no acute findings, heel spur DG right foot-likely fracture of the proximal fifth phalanges  Assessment and plan:  # Right foot pain Likely nonhealing fracture, will await official radiology read as the findings are very subtle Discussed supportive care Since it has not  healed in 2 months I would recommend follow-up with orthopedic surgery for their recommendations. Good supportive shoes in the meantime.  # Left foot pain Persistent, neuropathic in character Increase gabapentin to 600 mg at night Consider Lyrica increase gabapentin is unhelpful    Orders Placed This Encounter  Procedures  . DG Foot Complete Right    Standing Status: Future     Number of Occurrences: 1     Standing Expiration Date: 10/16/2016    Order Specific Question:  Reason for Exam (SYMPTOM  OR DIAGNOSIS REQUIRED)    Answer:  foot pain    Order Specific Question:  Preferred imaging location?    Answer:  Internal  . DG Foot Complete Left    Standing Status: Future     Number of Occurrences: 1     Standing Expiration Date: 10/16/2016    Order Specific Question:  Reason for Exam (SYMPTOM  OR DIAGNOSIS REQUIRED)    Answer:  foot pain    Order Specific Question:  Preferred imaging location?    Answer:  Internal    Murtis SinkSam Tonie Vizcarrondo, MD [facility] 08/17/2015, 5:02 PM

## 2015-08-18 ENCOUNTER — Encounter: Payer: Self-pay | Admitting: Family Medicine

## 2015-08-19 DIAGNOSIS — M79674 Pain in right toe(s): Secondary | ICD-10-CM | POA: Diagnosis not present

## 2015-08-24 DIAGNOSIS — H1045 Other chronic allergic conjunctivitis: Secondary | ICD-10-CM | POA: Diagnosis not present

## 2015-09-09 DIAGNOSIS — M7631 Iliotibial band syndrome, right leg: Secondary | ICD-10-CM | POA: Diagnosis not present

## 2015-10-05 DIAGNOSIS — E782 Mixed hyperlipidemia: Secondary | ICD-10-CM | POA: Diagnosis not present

## 2015-10-05 DIAGNOSIS — Z79899 Other long term (current) drug therapy: Secondary | ICD-10-CM | POA: Diagnosis not present

## 2015-10-05 DIAGNOSIS — Z139 Encounter for screening, unspecified: Secondary | ICD-10-CM | POA: Diagnosis not present

## 2015-10-05 DIAGNOSIS — E039 Hypothyroidism, unspecified: Secondary | ICD-10-CM | POA: Diagnosis not present

## 2015-10-05 DIAGNOSIS — E1169 Type 2 diabetes mellitus with other specified complication: Secondary | ICD-10-CM | POA: Diagnosis not present

## 2015-10-13 DIAGNOSIS — Z1231 Encounter for screening mammogram for malignant neoplasm of breast: Secondary | ICD-10-CM | POA: Diagnosis not present

## 2015-10-14 DIAGNOSIS — E039 Hypothyroidism, unspecified: Secondary | ICD-10-CM | POA: Diagnosis not present

## 2015-10-14 DIAGNOSIS — I10 Essential (primary) hypertension: Secondary | ICD-10-CM | POA: Diagnosis not present

## 2015-10-14 DIAGNOSIS — E782 Mixed hyperlipidemia: Secondary | ICD-10-CM | POA: Diagnosis not present

## 2015-10-14 DIAGNOSIS — E1169 Type 2 diabetes mellitus with other specified complication: Secondary | ICD-10-CM | POA: Diagnosis not present

## 2015-10-26 ENCOUNTER — Ambulatory Visit (INDEPENDENT_AMBULATORY_CARE_PROVIDER_SITE_OTHER): Payer: BLUE CROSS/BLUE SHIELD | Admitting: Physician Assistant

## 2015-10-26 ENCOUNTER — Ambulatory Visit (INDEPENDENT_AMBULATORY_CARE_PROVIDER_SITE_OTHER): Payer: BLUE CROSS/BLUE SHIELD

## 2015-10-26 ENCOUNTER — Encounter: Payer: Self-pay | Admitting: Physician Assistant

## 2015-10-26 VITALS — BP 127/80 | HR 71 | Temp 97.7°F | Ht 69.0 in | Wt 212.2 lb

## 2015-10-26 DIAGNOSIS — M545 Low back pain, unspecified: Secondary | ICD-10-CM

## 2015-10-26 NOTE — Progress Notes (Signed)
Subjective:     Patient ID: [name], female   DOB: 1951/02/04, 65 y.o.   MRN: [id number]  HPI Pt with a fall down several stairs on Fri last week She denies any LOC States she dislocated the L patella but reduced that on her own Still with some pain to the knees but mainly here due to R LBP Hx of ESI in the past for LBP Using OTC NSAIDS and cool compresses for sx  Review of Systems Pain to the R lower back No radiation of sx to the lower ext No sx of numbness No change in bowel/bladder sx    Objective:   Physical Exam NAD Slow with change in position Gait nl + TTP R L-spine/SI joint area No ecchy/edema seen No palp muscle spasm FROM of L-spine- sx with hyperextension Good strength to lower ext SLR neg DTR 1+/= lower ext Good pulses/sensory Xray- degen changes no acute findings    Assessment:     1. Right-sided low back pain without sciatica        Plan:     Heat/Ice Gentle stretching OTC NSAIDS Pt already on Robaxin prn F/U prn

## 2015-10-26 NOTE — Patient Instructions (Signed)

## 2015-10-30 ENCOUNTER — Encounter: Payer: Self-pay | Admitting: Physician Assistant

## 2015-12-28 DIAGNOSIS — E039 Hypothyroidism, unspecified: Secondary | ICD-10-CM | POA: Diagnosis not present

## 2015-12-28 DIAGNOSIS — E782 Mixed hyperlipidemia: Secondary | ICD-10-CM | POA: Diagnosis not present

## 2015-12-28 DIAGNOSIS — E1169 Type 2 diabetes mellitus with other specified complication: Secondary | ICD-10-CM | POA: Diagnosis not present

## 2015-12-28 DIAGNOSIS — I10 Essential (primary) hypertension: Secondary | ICD-10-CM | POA: Diagnosis not present

## 2016-01-19 DIAGNOSIS — Z79899 Other long term (current) drug therapy: Secondary | ICD-10-CM | POA: Diagnosis not present

## 2016-01-19 DIAGNOSIS — E1169 Type 2 diabetes mellitus with other specified complication: Secondary | ICD-10-CM | POA: Diagnosis not present

## 2016-01-19 DIAGNOSIS — E782 Mixed hyperlipidemia: Secondary | ICD-10-CM | POA: Diagnosis not present

## 2016-01-19 DIAGNOSIS — E039 Hypothyroidism, unspecified: Secondary | ICD-10-CM | POA: Diagnosis not present

## 2016-01-19 DIAGNOSIS — Z139 Encounter for screening, unspecified: Secondary | ICD-10-CM | POA: Diagnosis not present

## 2016-01-26 DIAGNOSIS — I10 Essential (primary) hypertension: Secondary | ICD-10-CM | POA: Diagnosis not present

## 2016-01-26 DIAGNOSIS — E039 Hypothyroidism, unspecified: Secondary | ICD-10-CM | POA: Diagnosis not present

## 2016-01-26 DIAGNOSIS — E782 Mixed hyperlipidemia: Secondary | ICD-10-CM | POA: Diagnosis not present

## 2016-01-26 DIAGNOSIS — E1169 Type 2 diabetes mellitus with other specified complication: Secondary | ICD-10-CM | POA: Diagnosis not present

## 2016-01-29 ENCOUNTER — Telehealth: Payer: Self-pay

## 2016-01-29 NOTE — Telephone Encounter (Signed)
Tried to contact patient to have her come in for follow up on DM2. lmtcb

## 2016-02-02 ENCOUNTER — Telehealth: Payer: Self-pay

## 2016-02-02 NOTE — Telephone Encounter (Signed)
lmtcb to make follow up apt. If patient returns call she just needs a f/up on DM2.

## 2016-02-05 DIAGNOSIS — J209 Acute bronchitis, unspecified: Secondary | ICD-10-CM | POA: Diagnosis not present

## 2016-02-09 ENCOUNTER — Ambulatory Visit: Payer: BLUE CROSS/BLUE SHIELD | Admitting: Family Medicine

## 2016-02-11 ENCOUNTER — Ambulatory Visit (INDEPENDENT_AMBULATORY_CARE_PROVIDER_SITE_OTHER): Payer: BLUE CROSS/BLUE SHIELD | Admitting: Family Medicine

## 2016-02-11 ENCOUNTER — Encounter: Payer: Self-pay | Admitting: Family Medicine

## 2016-02-11 VITALS — BP 109/71 | HR 90 | Temp 97.4°F | Ht 69.0 in | Wt 212.0 lb

## 2016-02-11 DIAGNOSIS — F5101 Primary insomnia: Secondary | ICD-10-CM | POA: Diagnosis not present

## 2016-02-11 DIAGNOSIS — F411 Generalized anxiety disorder: Secondary | ICD-10-CM | POA: Diagnosis not present

## 2016-02-11 DIAGNOSIS — E1165 Type 2 diabetes mellitus with hyperglycemia: Secondary | ICD-10-CM

## 2016-02-11 DIAGNOSIS — N9089 Other specified noninflammatory disorders of vulva and perineum: Secondary | ICD-10-CM

## 2016-02-11 DIAGNOSIS — Z23 Encounter for immunization: Secondary | ICD-10-CM | POA: Diagnosis not present

## 2016-02-11 LAB — WET PREP FOR TRICH, YEAST, CLUE
Clue Cell Exam: NEGATIVE
Trichomonas Exam: NEGATIVE
YEAST EXAM: NEGATIVE

## 2016-02-11 MED ORDER — ALPRAZOLAM 0.5 MG PO TABS: 0.5000 mg | ORAL_TABLET | Freq: Every evening | ORAL | 0 refills | Status: DC | PRN

## 2016-02-11 MED ORDER — FLUCONAZOLE 150 MG PO TABS: ORAL_TABLET | ORAL | 0 refills | Status: DC

## 2016-02-11 NOTE — Progress Notes (Signed)
   HPI  Patient presents today here with a labial lesion.  Patient explains that she's had itching and burning of a small area on her left labia for about 2 months.  Her husband took a look and stated that it looked to be a large red area that was raw.  She's tried Vagisil with no improvement.  She has a Hx of hyst with Ovaries left in place in 1995. She does have hot flashes.   She  Uses xanax for years for sleep, uses 1/2 pill at night when she as ahd a hard day and cant sleep.   PMH: Smoking status noted ROS: Per HPI  Objective: BP 109/71   Pulse 90   Temp 97.4 F (36.3 C) (Oral)   Ht 5\' 9"  (1.753 m)   Wt 212 lb (96.2 kg)   BMI 31.31 kg/m  Gen: NAD, alert, cooperative with exam HEENT: NCAT CV: RRR, good S1/S2, no murmur Resp: CTABL, no wheezes, non-labored Ext: No edema, warm Neuro: Alert and oriented, No gross deficits  L Labia with Erythematous flat lesion measuring approx 1.5 cm X 3 cm, opposing labia with small white plaque, 0.5 cm square approx Some tenderness Vaginal cuff normal appearing wihout  No atrophy apparent  Assessment and plan:  # Labial lesion Unclear etiology, considering presence for 2 months I am concerned for lichen sclerosis, she has been on recent antibiotics wet prep was negative Trial of diflucan - empirically given antibiotics Refer to GYN, may require biopsy.   # Insomnia, GAD Refilled xanax, intermittent use Consider belsomra Denies anxiety otherwise  DM2 Reviewed recent A1C, 7.0- controlled Scan into chart Managed by her NP at work, no changes  Orders Placed This Encounter  Procedures  . WET PREP FOR TRICH, YEAST, CLUE  . Ambulatory referral to Gynecology    Referral Priority:   Routine    Referral Type:   Consultation    Referral Reason:   Specialty Services Required    Requested Specialty:   Gynecology    Number of Visits Requested:   1    Meds ordered this encounter  Medications  . ALPRAZolam (XANAX) 0.5 MG tablet     Sig: Take 1 tablet (0.5 mg total) by mouth at bedtime as needed for anxiety.    Dispense:  90 tablet    Refill:  0  . fluconazole (DIFLUCAN) 150 MG tablet    Sig: Take one pill and repeat in 3 days    Dispense:  2 tablet    Refill:  0    Murtis SinkSam Mindi Akerson, MD [facility] 02/11/2016, 3:54 PM

## 2016-02-19 ENCOUNTER — Ambulatory Visit (INDEPENDENT_AMBULATORY_CARE_PROVIDER_SITE_OTHER): Payer: BLUE CROSS/BLUE SHIELD | Admitting: Obstetrics and Gynecology

## 2016-02-19 ENCOUNTER — Encounter: Payer: Self-pay | Admitting: Obstetrics and Gynecology

## 2016-02-19 VITALS — BP 110/80 | HR 70 | Wt 212.2 lb

## 2016-02-19 DIAGNOSIS — N952 Postmenopausal atrophic vaginitis: Secondary | ICD-10-CM | POA: Diagnosis not present

## 2016-02-19 MED ORDER — ESTROGENS, CONJUGATED 0.625 MG/GM VA CREA: 1.0000 | TOPICAL_CREAM | VAGINAL | 2 refills | Status: DC

## 2016-02-19 NOTE — Progress Notes (Signed)
[facility] Visit  02/19/2016           Patient name: Amanda BridegroomKathy B Clemon MRN [id number]  Date of birth: November 27, 1950  CC & HPI:  [name] is a 65 y.o. female presenting today for a gradually worsening spot to her left labia x a while. She notes pain to the site. No alleviating factors noted. Pt has no other acute complaints or symptoms at this time.    ROS:  ROS Otherwise negative for acute change except as noted in the HPI.   Pertinent History Reviewed:   Reviewed: Significant for  Medical         Past Medical History:  Diagnosis Date  . Diabetes mellitus   . GERD (gastroesophageal reflux disease)   . Hyperlipidemia   . Hypertension   . Hypothyroidism                               Surgical Hx:    Past Surgical History:  Procedure Laterality Date  . APPENDECTOMY    . CARPAL TUNNEL RELEASE    . CHOLECYSTECTOMY    . KNEE ARTHROSCOPY     left  . PARTIAL HYSTERECTOMY     Medications: Reviewed & Updated - see associated section                       Current Outpatient Prescriptions:  .  ALPRAZolam (XANAX) 0.5 MG tablet, Take 1 tablet (0.5 mg total) by mouth at bedtime as needed for anxiety., Disp: 90 tablet, Rfl: 0 .  Biotin 5000 MCG TABS, Take 10,000 mcg by mouth daily. , Disp: , Rfl:  .  Coenzyme Q10 (CO Q 10) 100 MG CAPS, Take 100 mg by mouth daily. , Disp: , Rfl:  .  esomeprazole (NEXIUM) 40 MG capsule, Take 40 mg by mouth daily before breakfast., Disp: , Rfl:  .  FLAXSEED, LINSEED, PO, Take 1 tablet by mouth daily., Disp: , Rfl:  .  fluconazole (DIFLUCAN) 150 MG tablet, Take one pill and repeat in 3 days, Disp: 2 tablet, Rfl: 0 .  gabapentin (NEURONTIN) 300 MG capsule, 600 mg at night, 300 mg in am, Disp: 90 capsule, Rfl: 3 .  levothyroxine (SYNTHROID, LEVOTHROID) 88 MCG tablet, Take 88 mcg by mouth every evening. , Disp: , Rfl:  .  lisinopril (PRINIVIL,ZESTRIL) 20 MG tablet, Take 10 mg by mouth Daily. , Disp: , Rfl:  .  Magnesium 400 MG CAPS, Take 1 tablet by  mouth daily., Disp: , Rfl:  .  metFORMIN (GLUCOPHAGE-XR) 500 MG 24 hr tablet, Take 500 mg by mouth 3 (three) times daily. , Disp: , Rfl:  .  methocarbamol (ROBAXIN) 750 MG tablet, Take 750 mg by mouth 4 (four) times daily., Disp: , Rfl:  .  metoprolol tartrate (LOPRESSOR) 25 MG tablet, Take 25 mg by mouth 2 (two) times daily. , Disp: , Rfl:  .  rosuvastatin (CRESTOR) 5 MG tablet, Take 5 mg by mouth daily., Disp: , Rfl:  .  spironolactone (ALDACTONE) 50 MG tablet, Take by mouth daily. Take 25mg  daily, Disp: , Rfl:  .  Cholecalciferol (D3-1000) 1000 UNITS capsule, Take 1,000 Units by mouth daily., Disp: , Rfl:    Social History: Reviewed -  reports that she has never smoked. She has never used smokeless tobacco.  Objective Findings:  Vitals: Blood pressure 110/80, pulse 70, weight 212 lb 3.2 oz (96.3 kg).  Physical Examination: General appearance - alert, well appearing, and in no distress Mental status - alert, oriented to person, place, and time Pelvic -  VULVA & VAGINA:  Atrophic vaginal tiisue; left labia majora with 2x2 cm of superficial excoriation with hyperemia    Assessment & Plan:   A:  1. Atrophic vaginal tissues  P:  1. Apply Premarin hs for 6 weeks and then twice weekly. Samples given in office. Rx written  By signing my name below, I, Freida Busman, attest that this documentation has been prepared under the direction and in the presence of Tilda Burrow, MD . Electronically Signed: Freida Busman, Scribe. 02/19/2016. 12:13 PM. I personally performed the services described in this documentation, which was SCRIBED in my presence. The recorded information has been reviewed and considered accurate. It has been edited as necessary during review. Tilda Burrow, MD

## 2016-02-20 DIAGNOSIS — N952 Postmenopausal atrophic vaginitis: Secondary | ICD-10-CM | POA: Insufficient documentation

## 2016-04-09 ENCOUNTER — Telehealth: Payer: Self-pay | Admitting: Pediatrics

## 2016-04-09 DIAGNOSIS — Z20828 Contact with and (suspected) exposure to other viral communicable diseases: Secondary | ICD-10-CM

## 2016-04-09 MED ORDER — OSELTAMIVIR PHOSPHATE 75 MG PO CAPS: 75.0000 mg | ORAL_CAPSULE | Freq: Every day | ORAL | 0 refills | Status: DC

## 2016-04-09 NOTE — Telephone Encounter (Signed)
Husband with temp of 104, getting treatment for the flu. Sending in tamiflu as ppx.

## 2016-05-09 ENCOUNTER — Encounter: Payer: Self-pay | Admitting: Obstetrics and Gynecology

## 2016-05-09 DIAGNOSIS — E119 Type 2 diabetes mellitus without complications: Secondary | ICD-10-CM | POA: Diagnosis not present

## 2016-05-09 LAB — HM DIABETES EYE EXAM

## 2016-05-10 ENCOUNTER — Other Ambulatory Visit: Payer: Self-pay | Admitting: Obstetrics and Gynecology

## 2016-07-11 DIAGNOSIS — E782 Mixed hyperlipidemia: Secondary | ICD-10-CM | POA: Diagnosis not present

## 2016-07-11 DIAGNOSIS — E039 Hypothyroidism, unspecified: Secondary | ICD-10-CM | POA: Diagnosis not present

## 2016-07-11 DIAGNOSIS — Z008 Encounter for other general examination: Secondary | ICD-10-CM | POA: Diagnosis not present

## 2016-07-11 DIAGNOSIS — E1169 Type 2 diabetes mellitus with other specified complication: Secondary | ICD-10-CM | POA: Diagnosis not present

## 2016-07-16 DIAGNOSIS — W5501XA Bitten by cat, initial encounter: Secondary | ICD-10-CM | POA: Diagnosis not present

## 2016-07-16 DIAGNOSIS — S61258A Open bite of other finger without damage to nail, initial encounter: Secondary | ICD-10-CM | POA: Diagnosis not present

## 2016-07-17 ENCOUNTER — Encounter (HOSPITAL_COMMUNITY): Payer: Self-pay | Admitting: Emergency Medicine

## 2016-07-17 ENCOUNTER — Emergency Department (HOSPITAL_COMMUNITY)
Admission: EM | Admit: 2016-07-17 | Discharge: 2016-07-17 | Disposition: A | Discharge status: Home / Self Care | Payer: BLUE CROSS/BLUE SHIELD | Attending: Emergency Medicine | Admitting: Emergency Medicine

## 2016-07-17 DIAGNOSIS — S61230A Puncture wound without foreign body of right index finger without damage to nail, initial encounter: Secondary | ICD-10-CM | POA: Insufficient documentation

## 2016-07-17 DIAGNOSIS — Z23 Encounter for immunization: Secondary | ICD-10-CM | POA: Insufficient documentation

## 2016-07-17 DIAGNOSIS — S61250A Open bite of right index finger without damage to nail, initial encounter: Secondary | ICD-10-CM | POA: Diagnosis not present

## 2016-07-17 DIAGNOSIS — Y999 Unspecified external cause status: Secondary | ICD-10-CM | POA: Insufficient documentation

## 2016-07-17 DIAGNOSIS — Y929 Unspecified place or not applicable: Secondary | ICD-10-CM | POA: Diagnosis not present

## 2016-07-17 DIAGNOSIS — Y939 Activity, unspecified: Secondary | ICD-10-CM | POA: Diagnosis not present

## 2016-07-17 DIAGNOSIS — Z2914 Encounter for prophylactic rabies immune globin: Secondary | ICD-10-CM | POA: Diagnosis not present

## 2016-07-17 DIAGNOSIS — E119 Type 2 diabetes mellitus without complications: Secondary | ICD-10-CM | POA: Diagnosis not present

## 2016-07-17 DIAGNOSIS — Z203 Contact with and (suspected) exposure to rabies: Secondary | ICD-10-CM | POA: Insufficient documentation

## 2016-07-17 DIAGNOSIS — E039 Hypothyroidism, unspecified: Secondary | ICD-10-CM | POA: Diagnosis not present

## 2016-07-17 DIAGNOSIS — I10 Essential (primary) hypertension: Secondary | ICD-10-CM | POA: Insufficient documentation

## 2016-07-17 DIAGNOSIS — W5501XA Bitten by cat, initial encounter: Secondary | ICD-10-CM | POA: Diagnosis not present

## 2016-07-17 DIAGNOSIS — Z7984 Long term (current) use of oral hypoglycemic drugs: Secondary | ICD-10-CM | POA: Diagnosis not present

## 2016-07-17 DIAGNOSIS — Z79899 Other long term (current) drug therapy: Secondary | ICD-10-CM | POA: Diagnosis not present

## 2016-07-17 LAB — BASIC METABOLIC PANEL
Anion gap: 7 (ref 5–15)
BUN: 12 mg/dL (ref 6–20)
CALCIUM: 9 mg/dL (ref 8.9–10.3)
CO2: 26 mmol/L (ref 22–32)
CREATININE: 0.66 mg/dL (ref 0.44–1.00)
Chloride: 104 mmol/L (ref 101–111)
GFR calc non Af Amer: >60 mL/min (ref >60)
Glucose, Bld: 134 mg/dL — ABNORMAL HIGH (ref 65–99)
Potassium: 4.2 mmol/L (ref 3.5–5.1)
SODIUM: 137 mmol/L (ref 135–145)

## 2016-07-17 LAB — CBC WITH DIFFERENTIAL/PLATELET
BASOS PCT: 0 %
Basophils Absolute: 0 10*3/uL (ref 0.0–0.1)
EOS ABS: 0.3 10*3/uL (ref 0.0–0.7)
EOS PCT: 3 %
HCT: 42.5 % (ref 36.0–46.0)
Hemoglobin: 14.2 g/dL (ref 12.0–15.0)
Lymphocytes Relative: 23 %
Lymphs Abs: 2.1 10*3/uL (ref 0.7–4.0)
MCH: 29.3 pg (ref 26.0–34.0)
MCHC: 33.4 g/dL (ref 30.0–36.0)
MCV: 87.6 fL (ref 78.0–100.0)
Monocytes Absolute: 0.7 10*3/uL (ref 0.1–1.0)
Monocytes Relative: 8 %
NEUTROS PCT: 66 %
Neutro Abs: 5.9 10*3/uL (ref 1.7–7.7)
PLATELETS: 255 10*3/uL (ref 150–400)
RBC: 4.85 MIL/uL (ref 3.87–5.11)
RDW: 13.9 % (ref 11.5–15.5)
WBC: 9 10*3/uL (ref 4.0–10.5)

## 2016-07-17 MED ORDER — SODIUM CHLORIDE 0.9 % IV SOLN
3.0000 g | Freq: Once | INTRAVENOUS | Status: AC
  Administered 2016-07-17: 3 g via INTRAVENOUS
  Filled 2016-07-17: qty 3

## 2016-07-17 MED ORDER — DOXYCYCLINE HYCLATE 100 MG PO CAPS: 100.0000 mg | ORAL_CAPSULE | Freq: Two times a day (BID) | ORAL | 0 refills | Status: DC

## 2016-07-17 MED ORDER — RABIES IMMUNE GLOBULIN 150 UNIT/ML IM INJ
20.0000 [IU]/kg | INJECTION | Freq: Once | INTRAMUSCULAR | Status: AC
  Administered 2016-07-17: 1875 [IU] via INTRAMUSCULAR
  Filled 2016-07-17 (×2): qty 14

## 2016-07-17 MED ORDER — RABIES VACCINE, PCEC IM SUSR
1.0000 mL | Freq: Once | INTRAMUSCULAR | Status: AC
Start: 2016-07-17 — End: 2016-07-17
  Administered 2016-07-17: 1 mL via INTRAMUSCULAR
  Filled 2016-07-17: qty 1

## 2016-07-17 NOTE — ED Triage Notes (Addendum)
Patient has cat bite to left index finger. Per patient bit by cat stray cat yesterday, seen at urgent care, and given antibiotics (Augmentin). Per patient increase in redness and swelling. Denies any fevers or pain. Patient reports cleaning finger with betadine after incident. Animal control aware. Denies being given rabies vaccination. Patient is diabetic.

## 2016-07-17 NOTE — ED Notes (Signed)
Pt denies getting rabies shot.

## 2016-07-17 NOTE — Discharge Instructions (Signed)
Clean the wound with mild soap and water.  Return here tomorrow for recheck or if worsening tomorrow, [facility].  Continue the augmentin as directed.

## 2016-07-17 NOTE — ED Provider Notes (Signed)
[facility] DEPT Provider Note   CSN: [id number] Arrival date & time: 07/17/16  0913     History   Chief Complaint Chief Complaint  Patient presents with  . Animal Bite    HPI [name] is a 66 y.o. female.  HPI  [name] is a 67 y.o. female who presents to the Emergency Department complaining of cat bite that occurred one day prior to arrival.  She reports being bitten on the right index finger by a neighborhood stray cat.  Incident was reported by the pt to local animal control and she was seen at an urgent care facility and started on Augmentin.  She reports taking 3 doses of the medication thus far, but woke up noticing swelling and redness of the finger.  She states last Td is up to date.  Denies pain to the finger, fever, chills, and difficulty moving the finger.    Past Medical History:  Diagnosis Date  . Diabetes mellitus   . GERD (gastroesophageal reflux disease)   . Hyperlipidemia   . Hypertension   . Hypothyroidism     Patient Active Problem List   Diagnosis Date Noted  . Atrophic vaginitis 02/20/2016  . Hypothyroidism 09/10/2014  . GERD (gastroesophageal reflux disease) 09/10/2014  . Vitamin D deficiency 09/10/2014  . Diabetes mellitus, type 2 (HCC) 09/10/2014  . GAD (generalized anxiety disorder) 09/10/2014  . Obesity, unspecified 03/26/2013  . Mixed hyperlipidemia 03/26/2013  . Pulmonary nodule 09/22/2011  . Cough 09/22/2011  . Hypertension 09/22/2011    Past Surgical History:  Procedure Laterality Date  . APPENDECTOMY    . CARPAL TUNNEL RELEASE    . CHOLECYSTECTOMY    . KNEE ARTHROSCOPY     left  . PARTIAL HYSTERECTOMY      OB History    Gravida Para Term Preterm AB Living   2 2 2     2    SAB TAB Ectopic Multiple Live Births                   Home Medications    Prior to Admission medications   Medication Sig Start Date End Date Taking? Authorizing Provider  ALPRAZolam Prudy Feeler) 0.5 MG tablet Take 1 tablet (0.5 mg total) by  mouth at bedtime as needed for anxiety. 02/11/16  Yes Elenora Gamma, MD  Biotin 5000 MCG TABS Take 10,000 mcg by mouth daily.    Yes Historical Provider, MD  Cholecalciferol (D3-1000) 1000 UNITS capsule Take 1,000 Units by mouth daily.   Yes Historical Provider, MD  Coenzyme Q10 (CO Q 10) 100 MG CAPS Take 100 mg by mouth daily.    Yes Historical Provider, MD  conjugated estrogens (PREMARIN) vaginal cream Place 1 Applicatorful vaginally 3 (three) times a week. For vaginal thinning and irritation 02/19/16  Yes Tilda Burrow, MD  esomeprazole (NEXIUM) 40 MG capsule Take 40 mg by mouth daily before breakfast.   Yes Historical Provider, MD  FLAXSEED, LINSEED, PO Take 1 tablet by mouth daily.   Yes Historical Provider, MD  gabapentin (NEURONTIN) 300 MG capsule 600 mg at night, 300 mg in am 08/17/15  Yes Elenora Gamma, MD  levothyroxine (SYNTHROID, LEVOTHROID) 88 MCG tablet Take 88 mcg by mouth every evening.    Yes Historical Provider, MD  lisinopril (PRINIVIL,ZESTRIL) 20 MG tablet Take 10 mg by mouth Daily.  10/28/11  Yes Historical Provider, MD  Magnesium 400 MG CAPS Take 1 tablet by mouth daily.   Yes Historical Provider, MD  metFORMIN (GLUCOPHAGE-XR) 500 MG 24 hr tablet Take 500 mg by mouth 3 (three) times daily.    Yes Historical Provider, MD  methocarbamol (ROBAXIN) 750 MG tablet Take 750 mg by mouth 4 (four) times daily.   Yes Historical Provider, MD  metoprolol tartrate (LOPRESSOR) 25 MG tablet Take 25 mg by mouth 2 (two) times daily.    Yes Historical Provider, MD  rosuvastatin (CRESTOR) 5 MG tablet Take 5 mg by mouth daily.   Yes Historical Provider, MD  spironolactone (ALDACTONE) 50 MG tablet Take by mouth daily. Take 25mg  daily   Yes Historical Provider, MD  fluconazole (DIFLUCAN) 150 MG tablet Take one pill and repeat in 3 days Patient not taking: Reported on 07/17/2016 02/11/16   Elenora GammaSamuel L Bradshaw, MD  oseltamivir (TAMIFLU) 75 MG capsule Take 1 capsule (75 mg total) by mouth  daily. Patient not taking: Reported on 07/17/2016 04/09/16   Johna Sheriffarol L Vincent, MD    Family History Family History  Problem Relation Age of Onset  . Heart disease Mother   . Heart disease Father   . Colon cancer Father   . Brain cancer Father   . Lung cancer Maternal Uncle     smoked  . Lung cancer Maternal Uncle     smoked    Social History Social History  Substance Use Topics  . Smoking status: Never Smoker  . Smokeless tobacco: Never Used  . Alcohol use No     Allergies   Dilaudid [hydromorphone hcl]   Review of Systems Review of Systems  Constitutional: Negative for chills and fever.  Musculoskeletal: Positive for myalgias (swelling of the right index finger). Negative for arthralgias, back pain and joint swelling.  Skin: Positive for color change and wound.       Cat bite right index finger  Neurological: Negative for dizziness, weakness and numbness.  Hematological: Does not bruise/bleed easily.  All other systems reviewed and are negative.    Physical Exam Updated Vital Signs BP (!) 140/96 (BP Location: Right Arm)   Pulse 77   Temp 98.1 F (36.7 C) (Oral)   Resp 18   Ht 5\' 8"  (1.727 m)   Wt 92.5 kg   SpO2 98%   BMI 31.02 kg/m   Physical Exam  Constitutional: She is oriented to person, place, and time. She appears well-developed and well-nourished. No distress.  HENT:  Head: Normocephalic and atraumatic.  Cardiovascular: Normal rate, regular rhythm and intact distal pulses.   No murmur heard. Pulmonary/Chest: Effort normal and breath sounds normal. No respiratory distress.  Musculoskeletal: Normal range of motion. She exhibits edema. She exhibits no tenderness.       Right hand: She exhibits swelling. She exhibits normal range of motion, no bony tenderness, normal two-point discrimination, normal capillary refill and no laceration. Normal sensation noted. Normal strength noted.       Hands: Single small puncture wound to the radial aspect of the  proximal right index finger.  Edema and erythema extending just proximal to PIP to the MCP.  Pt has full ROM of the finger w/o difficulty  Neurological: She is alert and oriented to person, place, and time. She exhibits normal muscle tone. Coordination normal.  Skin: Skin is warm. Capillary refill takes less than 2 seconds. No laceration noted.  Nursing note and vitals reviewed.    ED Treatments / Results  Labs (all labs ordered are listed, but only abnormal results are displayed) Labs Reviewed  BASIC METABOLIC PANEL - Abnormal; Notable for the  following:       Result Value   Glucose, Bld 134 (*)    All other components within normal limits  CBC WITH DIFFERENTIAL/PLATELET    EKG  EKG Interpretation None       Radiology No results found.  Procedures Procedures (including critical care time)  Medications Ordered in ED Medications  rabies vaccine (RABAVERT) injection 1 mL (1 mL Intramuscular Given 07/17/16 1107)  rabies immune globulin (HYPERAB) injection 1,875 Units (1,875 Units Intramuscular Given 07/17/16 1106)  Ampicillin-Sulbactam (UNASYN) 3 g in sodium chloride 0.9 % 100 mL IVPB (3 g Intravenous New Bag/Given 07/17/16 1029)     Initial Impression / Assessment and Plan / ED Course  I have reviewed the triage vital signs and the nursing notes.  Pertinent labs & imaging results that were available during my care of the patient were reviewed by me and considered in my medical decision making (see chart for details).     Pt also seen by Dr. Estell Harpin.  Care plan discussed which includes ER return here tomorrow for recheck     Incident has been reported to [facility] per patient, animal is a stray.Rabies protocol initiated.    Pt given IV Unasyn here, will add doxy to her current tx with Augmentin.  Pt agrees to recheck in 24 hrs.   Final Clinical Impressions(s) / ED Diagnoses   Final diagnoses:  Cat bite, initial encounter    New Prescriptions New Prescriptions    No medications on file     Rosey Bath 07/20/16 2247    Bethann Berkshire, MD 07/22/16 1530

## 2016-07-18 ENCOUNTER — Emergency Department (HOSPITAL_COMMUNITY)
Admission: EM | Admit: 2016-07-18 | Discharge: 2016-07-18 | Disposition: A | Discharge status: Home / Self Care | Payer: BLUE CROSS/BLUE SHIELD | Attending: Emergency Medicine | Admitting: Emergency Medicine

## 2016-07-18 ENCOUNTER — Encounter (HOSPITAL_COMMUNITY): Payer: Self-pay | Admitting: Emergency Medicine

## 2016-07-18 DIAGNOSIS — Z7984 Long term (current) use of oral hypoglycemic drugs: Secondary | ICD-10-CM | POA: Diagnosis not present

## 2016-07-18 DIAGNOSIS — S61451D Open bite of right hand, subsequent encounter: Secondary | ICD-10-CM | POA: Diagnosis not present

## 2016-07-18 DIAGNOSIS — I10 Essential (primary) hypertension: Secondary | ICD-10-CM | POA: Diagnosis not present

## 2016-07-18 DIAGNOSIS — S61250D Open bite of right index finger without damage to nail, subsequent encounter: Secondary | ICD-10-CM | POA: Insufficient documentation

## 2016-07-18 DIAGNOSIS — W5501XD Bitten by cat, subsequent encounter: Secondary | ICD-10-CM | POA: Insufficient documentation

## 2016-07-18 DIAGNOSIS — E039 Hypothyroidism, unspecified: Secondary | ICD-10-CM | POA: Insufficient documentation

## 2016-07-18 DIAGNOSIS — E119 Type 2 diabetes mellitus without complications: Secondary | ICD-10-CM | POA: Diagnosis not present

## 2016-07-18 DIAGNOSIS — S6991XD Unspecified injury of right wrist, hand and finger(s), subsequent encounter: Secondary | ICD-10-CM | POA: Diagnosis present

## 2016-07-18 NOTE — Discharge Instructions (Signed)
Continue current antibiotics.  Follow-up if needed.

## 2016-07-18 NOTE — ED Triage Notes (Signed)
Pt reports was bitten by a cat yesterday was seen in ER and medicated with rabies vaccines. Pt reports was told to come back and have site rechecked. Pt denies any pain,fever, n/v,streaking of site.

## 2016-07-18 NOTE — ED Provider Notes (Signed)
[facility] Provider Note   CSN: [id number] Arrival date & time: 07/18/16  1512  By signing my name below, I, Cynda Acres, attest that this documentation has been prepared under the direction and in the presence of Siddharth Babington PA-C.  Electronically Signed: Cynda Acres, Scribe. 07/18/16. 3:57 PM.  History   Chief Complaint Chief Complaint  Patient presents with  . Follow-up   HPI Comments: [name] is a 66 y.o. female who presents to the Emergency Department upon PA request for re-check of a cat bite to the right hand that happened one day prior. Patient was seen here for a puncture wound caused by a cat bite. The site where erythema and edema were present was marked and she was prescribed antibiotics. Redness and swelling have improved. Patient is currently taking Augmentin and doxycycline with improvement. Patient denies any redness, hand tightness, swelling, fever, or any other symptoms.   The history is provided by the patient. No language interpreter was used.    Past Medical History:  Diagnosis Date  . Diabetes mellitus   . GERD (gastroesophageal reflux disease)   . Hyperlipidemia   . Hypertension   . Hypothyroidism     Patient Active Problem List   Diagnosis Date Noted  . Atrophic vaginitis 02/20/2016  . Hypothyroidism 09/10/2014  . GERD (gastroesophageal reflux disease) 09/10/2014  . Vitamin D deficiency 09/10/2014  . Diabetes mellitus, type 2 (HCC) 09/10/2014  . GAD (generalized anxiety disorder) 09/10/2014  . Obesity, unspecified 03/26/2013  . Mixed hyperlipidemia 03/26/2013  . Pulmonary nodule 09/22/2011  . Cough 09/22/2011  . Hypertension 09/22/2011    Past Surgical History:  Procedure Laterality Date  . APPENDECTOMY    . CARPAL TUNNEL RELEASE    . CHOLECYSTECTOMY    . KNEE ARTHROSCOPY     left  . PARTIAL HYSTERECTOMY      OB History    Gravida Para Term Preterm AB Living   2 2 2     2    SAB TAB Ectopic Multiple Live Births               Home Medications    Prior to Admission medications   Medication Sig Start Date End Date Taking? Authorizing Provider  ALPRAZolam Prudy Feeler) 0.5 MG tablet Take 1 tablet (0.5 mg total) by mouth at bedtime as needed for anxiety. 02/11/16   Elenora Gamma, MD  Biotin 5000 MCG TABS Take 10,000 mcg by mouth daily.     Historical Provider, MD  Cholecalciferol (D3-1000) 1000 UNITS capsule Take 1,000 Units by mouth daily.    Historical Provider, MD  Coenzyme Q10 (CO Q 10) 100 MG CAPS Take 100 mg by mouth daily.     Historical Provider, MD  conjugated estrogens (PREMARIN) vaginal cream Place 1 Applicatorful vaginally 3 (three) times a week. For vaginal thinning and irritation 02/19/16   Tilda Burrow, MD  doxycycline (VIBRAMYCIN) 100 MG capsule Take 1 capsule (100 mg total) by mouth 2 (two) times daily. 07/17/16   Haidee Stogsdill, PA-C  esomeprazole (NEXIUM) 40 MG capsule Take 40 mg by mouth daily before breakfast.    Historical Provider, MD  FLAXSEED, LINSEED, PO Take 1 tablet by mouth daily.    Historical Provider, MD  fluconazole (DIFLUCAN) 150 MG tablet Take one pill and repeat in 3 days Patient not taking: Reported on 07/17/2016 02/11/16   Elenora Gamma, MD  gabapentin (NEURONTIN) 300 MG capsule 600 mg at night, 300 mg in am 08/17/15   Remi Deter  Delphina CahillL Bradshaw, MD  levothyroxine (SYNTHROID, LEVOTHROID) 88 MCG tablet Take 88 mcg by mouth every evening.     Historical Provider, MD  lisinopril (PRINIVIL,ZESTRIL) 20 MG tablet Take 10 mg by mouth Daily.  10/28/11   Historical Provider, MD  Magnesium 400 MG CAPS Take 1 tablet by mouth daily.    Historical Provider, MD  metFORMIN (GLUCOPHAGE-XR) 500 MG 24 hr tablet Take 500 mg by mouth 3 (three) times daily.     Historical Provider, MD  methocarbamol (ROBAXIN) 750 MG tablet Take 750 mg by mouth 4 (four) times daily.    Historical Provider, MD  metoprolol tartrate (LOPRESSOR) 25 MG tablet Take 25 mg by mouth 2 (two) times daily.     Historical  Provider, MD  oseltamivir (TAMIFLU) 75 MG capsule Take 1 capsule (75 mg total) by mouth daily. Patient not taking: Reported on 07/17/2016 04/09/16   Johna Sheriffarol L Vincent, MD  rosuvastatin (CRESTOR) 5 MG tablet Take 5 mg by mouth daily.    Historical Provider, MD  spironolactone (ALDACTONE) 50 MG tablet Take by mouth daily. Take 25mg  daily    Historical Provider, MD    Family History Family History  Problem Relation Age of Onset  . Heart disease Mother   . Heart disease Father   . Colon cancer Father   . Brain cancer Father   . Lung cancer Maternal Uncle     smoked  . Lung cancer Maternal Uncle     smoked    Social History Social History  Substance Use Topics  . Smoking status: Never Smoker  . Smokeless tobacco: Never Used  . Alcohol use No     Allergies   Dilaudid [hydromorphone hcl]   Review of Systems Review of Systems  Constitutional: Negative for fever.  Gastrointestinal: Negative for nausea and vomiting.  Musculoskeletal: Negative for joint swelling.  Skin: Positive for wound (right index finger). Negative for color change.     Physical Exam Updated Vital Signs BP (!) 140/91 (BP Location: Left Arm)   Pulse 79   Temp 98.5 F (36.9 C) (Oral)   Resp 18   Ht 5\' 8"  (1.727 m)   Wt 204 lb (92.5 kg)   SpO2 98%   BMI 31.02 kg/m   Physical Exam  Constitutional: She is oriented to person, place, and time. She appears well-developed.  HENT:  Head: Normocephalic and atraumatic.  Mouth/Throat: Oropharynx is clear and moist.  Eyes: Conjunctivae and EOM are normal.  Cardiovascular: Normal rate.   Pulmonary/Chest: Effort normal.  Musculoskeletal: Normal range of motion.  Full range of motion right index finger, sensation intact.   Neurological: She is alert and oriented to person, place, and time.  Skin: Skin is warm and dry.  Healing puncture wound to the approximal right index finger without significant erythema or edema.   Psychiatric: She has a normal mood and  affect.  Nursing note and vitals reviewed.    ED Treatments / Results  DIAGNOSTIC STUDIES: Oxygen Saturation is 98% on RA, normal by my interpretation.    COORDINATION OF CARE: 3:57 PM Discussed treatment plan with pt at bedside and pt agreed to plan.  Labs (all labs ordered are listed, but only abnormal results are displayed) Labs Reviewed - No data to display  EKG  EKG Interpretation None       Radiology No results found.  Procedures Procedures (including critical care time)  Medications Ordered in ED Medications - No data to display   Initial Impression / Assessment and  Plan / ED Course  I have reviewed the triage vital signs and the nursing notes.  Pertinent labs & imaging results that were available during my care of the patient were reviewed by me and considered in my medical decision making (see chart for details).    Patient returned for re-check of cat bite to the finger. Symptoms have greatly improved. Neovascularly intact. Will continue current antibiotics. Return precautions discussed.  Final Clinical Impressions(s) / ED Diagnoses   Final diagnoses:  Cat bite, subsequent encounter    New Prescriptions New Prescriptions   No medications on file   I personally performed the services described in this documentation, which was scribed in my presence. The recorded information has been reviewed and is accurate.     Rosey Bath 07/22/16 2237    Bethann Berkshire, MD 07/22/16 [phone]

## 2016-07-19 DIAGNOSIS — E1169 Type 2 diabetes mellitus with other specified complication: Secondary | ICD-10-CM | POA: Diagnosis not present

## 2016-07-19 DIAGNOSIS — E782 Mixed hyperlipidemia: Secondary | ICD-10-CM | POA: Diagnosis not present

## 2016-07-19 DIAGNOSIS — R740 Nonspecific elevation of levels of transaminase and lactic acid dehydrogenase [LDH]: Secondary | ICD-10-CM | POA: Diagnosis not present

## 2016-07-19 DIAGNOSIS — E039 Hypothyroidism, unspecified: Secondary | ICD-10-CM | POA: Diagnosis not present

## 2016-07-20 ENCOUNTER — Encounter (HOSPITAL_COMMUNITY)
Admission: RE | Admit: 2016-07-20 | Discharge: 2016-07-20 | Disposition: A | Discharge status: Home / Self Care | Payer: BLUE CROSS/BLUE SHIELD | Source: Ambulatory Visit | Attending: Emergency Medicine | Admitting: Emergency Medicine

## 2016-07-20 ENCOUNTER — Encounter (HOSPITAL_COMMUNITY): Payer: Self-pay

## 2016-07-20 DIAGNOSIS — M7989 Other specified soft tissue disorders: Secondary | ICD-10-CM | POA: Diagnosis not present

## 2016-07-20 DIAGNOSIS — W5501XA Bitten by cat, initial encounter: Secondary | ICD-10-CM | POA: Insufficient documentation

## 2016-07-20 DIAGNOSIS — S61250A Open bite of right index finger without damage to nail, initial encounter: Secondary | ICD-10-CM | POA: Insufficient documentation

## 2016-07-20 MED ORDER — RABIES VACCINE, PCEC IM SUSR
1.0000 mL | Freq: Once | INTRAMUSCULAR | Status: AC
  Administered 2016-07-20: 1 mL via INTRAMUSCULAR

## 2016-07-20 MED ORDER — RABIES VACCINE, PCEC IM SUSR
INTRAMUSCULAR | Status: AC
  Filled 2016-07-20: qty 1

## 2016-07-24 ENCOUNTER — Ambulatory Visit (HOSPITAL_COMMUNITY): Payer: BLUE CROSS/BLUE SHIELD

## 2016-07-25 ENCOUNTER — Encounter (HOSPITAL_COMMUNITY)
Admission: RE | Admit: 2016-07-25 | Discharge: 2016-07-25 | Disposition: A | Discharge status: Home / Self Care | Payer: BLUE CROSS/BLUE SHIELD | Source: Ambulatory Visit | Attending: Emergency Medicine | Admitting: Emergency Medicine

## 2016-07-25 DIAGNOSIS — M7989 Other specified soft tissue disorders: Secondary | ICD-10-CM | POA: Diagnosis not present

## 2016-07-25 DIAGNOSIS — S61250A Open bite of right index finger without damage to nail, initial encounter: Secondary | ICD-10-CM | POA: Diagnosis not present

## 2016-07-25 MED ORDER — RABIES VACCINE, PCEC IM SUSR
1.0000 mL | Freq: Once | INTRAMUSCULAR | Status: AC
  Administered 2016-07-25: 1 mL via INTRAMUSCULAR
  Filled 2016-07-25: qty 1

## 2016-07-31 ENCOUNTER — Ambulatory Visit (HOSPITAL_COMMUNITY): Payer: BLUE CROSS/BLUE SHIELD

## 2016-08-01 ENCOUNTER — Encounter (HOSPITAL_COMMUNITY): Payer: Self-pay

## 2016-08-01 ENCOUNTER — Encounter (HOSPITAL_COMMUNITY)
Admission: RE | Admit: 2016-08-01 | Discharge: 2016-08-01 | Disposition: A | Discharge status: Home / Self Care | Payer: BLUE CROSS/BLUE SHIELD | Source: Ambulatory Visit | Attending: Emergency Medicine | Admitting: Emergency Medicine

## 2016-08-01 DIAGNOSIS — Z2914 Encounter for prophylactic rabies immune globin: Secondary | ICD-10-CM | POA: Diagnosis not present

## 2016-08-01 MED ORDER — RABIES VACCINE, PCEC IM SUSR
1.0000 mL | Freq: Once | INTRAMUSCULAR | Status: AC
  Administered 2016-08-01: 1 mL via INTRAMUSCULAR

## 2016-08-01 MED ORDER — RABIES VACCINE, PCEC IM SUSR
INTRAMUSCULAR | Status: AC
  Filled 2016-08-01: qty 1

## 2016-10-04 DIAGNOSIS — E1169 Type 2 diabetes mellitus with other specified complication: Secondary | ICD-10-CM | POA: Diagnosis not present

## 2016-10-04 DIAGNOSIS — Z719 Counseling, unspecified: Secondary | ICD-10-CM | POA: Diagnosis not present

## 2016-10-04 DIAGNOSIS — Z713 Dietary counseling and surveillance: Secondary | ICD-10-CM | POA: Diagnosis not present

## 2016-10-04 DIAGNOSIS — E039 Hypothyroidism, unspecified: Secondary | ICD-10-CM | POA: Diagnosis not present

## 2016-10-04 DIAGNOSIS — Z008 Encounter for other general examination: Secondary | ICD-10-CM | POA: Diagnosis not present

## 2016-10-04 DIAGNOSIS — E782 Mixed hyperlipidemia: Secondary | ICD-10-CM | POA: Diagnosis not present

## 2016-10-06 ENCOUNTER — Ambulatory Visit (INDEPENDENT_AMBULATORY_CARE_PROVIDER_SITE_OTHER): Payer: BLUE CROSS/BLUE SHIELD | Admitting: Family

## 2016-10-06 ENCOUNTER — Encounter: Payer: Self-pay | Admitting: Family

## 2016-10-06 ENCOUNTER — Ambulatory Visit (INDEPENDENT_AMBULATORY_CARE_PROVIDER_SITE_OTHER): Payer: BLUE CROSS/BLUE SHIELD

## 2016-10-06 VITALS — BP 123/85 | HR 74 | Temp 97.4°F | Ht 68.0 in | Wt 209.0 lb

## 2016-10-06 DIAGNOSIS — M25521 Pain in right elbow: Secondary | ICD-10-CM

## 2016-10-06 DIAGNOSIS — M7701 Medial epicondylitis, right elbow: Secondary | ICD-10-CM

## 2016-10-06 MED ORDER — PREDNISONE 10 MG (21) PO TBPK: ORAL_TABLET | ORAL | 0 refills | Status: DC

## 2016-10-06 MED ORDER — NAPROXEN 500 MG PO TABS: 500.0000 mg | ORAL_TABLET | Freq: Two times a day (BID) | ORAL | 1 refills | Status: DC

## 2016-10-06 NOTE — Patient Instructions (Signed)
Golfer's Elbow Golfer's elbow, also called medial epicondylitis, is a condition that results from inflammation of the strong bands of tissue (tendons) that attach your forearm muscles to the inside of your bone at the elbow. These tendons affect the muscles that bend the palm toward the wrist (flexion). This condition is called golfer's elbow because it is more common among people who constantly bend and twist their wrists, such as golfers. This injury usually results from overuse. Tendons also become less flexible with age. This condition causes elbow pain that may spread to your forearm and upper arm. The pain may get worse when you bend your wrist downward. What are the causes? This condition is an overuse injury that is caused by:  Repeatedly flexing, turning, or twisting your wrist.  Constantly gripping objects with your hands.  What increases the risk? This condition is more likely to develop in people who play golf or tennis or have jobs that require the constant use of their hands. This injury is more common among:  Carpenters.  Gardeners.  Musicians.  Bricklayers.  Typists.  What are the signs or symptoms? Symptoms of this condition include:  Pain near the inner elbow or forearm.  Reduced grip strength.  How is this diagnosed? This condition is diagnosed based on your symptoms, medical history, and physical exam. During the exam, your health care provider may test your grip strength and move your wrist to check for pain. You may also have an MRI to confirm the diagnosis, look for other issues, and check for tears in the ligaments, muscles, or tendons. How is this treated? Treatment for this condition includes:  Stopping all activities that make you bend or twist your wrist until your pain and other symptoms go away.  Icing your wrist to relieve pain.  Taking NSAIDs or getting corticosteroid injections to reduce pain and swelling.  Doing stretches, range-of-motion,  and strengthening exercises (physical therapy) as told by your health care provider.  In rare cases, surgery may be needed if your condition does not improve. Follow these instructions at home:  If directed, apply ice to the injured area. ? Put ice in a plastic bag. ? Place a towel between your skin and the bag. ? Leave the ice on for 20 minutes, 2-3 times a day.  Move your fingers often to avoid stiffness.  Raise (elevate) the injured area above the level of your heart while you are sitting or lying down.  Return to your normal activities as told by your health care provider. Ask your health care provider what activities are safe for you.  Do exercises as told by your health care provider.  Do not use tobacco products, including cigarettes, chewing tobacco, or e-cigarettes. If you need help quitting, ask your health care provider.  Take over-the-counter and prescription medicines only as told by your health care provider.  Keep all follow-up visits as told by your health care provider. This is important. How is this prevented?  Warm up and stretch before being active.  Cool down and stretch after being active.  Give your body time to rest between periods of activity.  Make sure to use equipment that fits you.  Be safe and responsible while being active to avoid falls.  Do at least 150 minutes of moderate-intensity exercise each week, such as brisk walking or water aerobics.  Maintain physical fitness, including: ? Strength. ? Flexibility. ? Cardiovascular fitness. ? Endurance.  Perform exercises to strengthen the forearm muscles.  Slow your golf   swing to reduce shock in the arm when making contact with the ball, if you play golf. Contact a health care provider if:  Your pain does not improve or it gets worse.  You notice numbness in your hand. Get help right away if:  Your pain is severe.  You cannot move your wrist. This information is not intended to replace  advice given to you by your health care provider. Make sure you discuss any questions you have with your health care provider. Document Released: 04/18/2005 Document Revised: 12/22/2015 Document Reviewed: 12/29/2014 Elsevier Interactive Patient Education  2018 [employer]  

## 2016-10-06 NOTE — Progress Notes (Signed)
   Subjective:    Patient ID: Blenda BridegroomKathy B Kalmbach, female    DOB: 10/25/1950, [age] y.o.   MRN: [id number]  HPI Pt presents to the office today with right elbow pain that started a month ago that has become worse. Pt has taken Aleve with no relief. Pt states she has intermittent pain 6 out 10  When she touches it. No trauma to the area, erythemas, or fever.    Review of Systems  Musculoskeletal:       Right elbow pain  All other systems reviewed and are negative.      Objective:   Physical Exam  Constitutional: She is oriented to person, place, and time. She appears well-developed and well-nourished. No distress.  HENT:  Head: Normocephalic.  Eyes: Pupils are equal, round, and reactive to light.  Neck: Normal range of motion. Neck supple. No thyromegaly present.  Cardiovascular: Normal rate, regular rhythm, normal heart sounds and intact distal pulses.   No murmur heard. Pulmonary/Chest: Effort normal and breath sounds normal. No respiratory distress. She has no wheezes.  Abdominal: Soft. Bowel sounds are normal. She exhibits no distension. There is no tenderness.  Musculoskeletal: Normal range of motion. She exhibits no edema or tenderness.  Tenderness present in medial elbow with palpation, full ROM  Neurological: She is alert and oriented to person, place, and time.  Skin: Skin is warm and dry.  Psychiatric: She has a normal mood and affect. Her behavior is normal. Judgment and thought content normal.  Vitals reviewed.   BP 123/85   Pulse 74   Temp 97.4 F (36.3 C) (Oral)   Ht 5\' 8"  (1.727 m)   Wt 209 lb (94.8 kg)   BMI 31.78 kg/m       Assessment & Plan:  1. Golfers elbow of right upper extremity -Rest Ice - predniSONE (STERAPRED UNI-PAK 21 TAB) 10 MG (21) TBPK tablet; Use as directed  Dispense: 21 tablet; Refill: 0 - naproxen (NAPROSYN) 500 MG tablet; Take 1 tablet (500 mg total) by mouth 2 (two) times daily with a meal.  Dispense: 60 tablet; Refill: 1  2. Right elbow  pain - DG Elbow 2 Views Right; Future   Jannifer Rodneyhristy Hawks, FNP

## 2016-10-06 NOTE — Addendum Note (Signed)
Addended by: Jannifer RodneyHAWKS, Ercie Eliasen on: 10/06/2016 02:32 PM   Modules accepted: Orders

## 2016-10-12 DIAGNOSIS — Z1231 Encounter for screening mammogram for malignant neoplasm of breast: Secondary | ICD-10-CM | POA: Diagnosis not present

## 2016-11-15 DIAGNOSIS — M4807 Spinal stenosis, lumbosacral region: Secondary | ICD-10-CM | POA: Diagnosis not present

## 2016-11-15 DIAGNOSIS — M5136 Other intervertebral disc degeneration, lumbar region: Secondary | ICD-10-CM | POA: Diagnosis not present

## 2016-11-15 DIAGNOSIS — M5126 Other intervertebral disc displacement, lumbar region: Secondary | ICD-10-CM | POA: Diagnosis not present

## 2016-11-22 DIAGNOSIS — M5126 Other intervertebral disc displacement, lumbar region: Secondary | ICD-10-CM | POA: Diagnosis not present

## 2016-11-28 DIAGNOSIS — M5126 Other intervertebral disc displacement, lumbar region: Secondary | ICD-10-CM | POA: Diagnosis not present

## 2016-11-28 DIAGNOSIS — M5431 Sciatica, right side: Secondary | ICD-10-CM | POA: Diagnosis not present

## 2016-11-28 DIAGNOSIS — M5136 Other intervertebral disc degeneration, lumbar region: Secondary | ICD-10-CM | POA: Diagnosis not present

## 2016-11-28 DIAGNOSIS — M5432 Sciatica, left side: Secondary | ICD-10-CM | POA: Diagnosis not present

## 2017-01-03 DIAGNOSIS — M5126 Other intervertebral disc displacement, lumbar region: Secondary | ICD-10-CM | POA: Diagnosis not present

## 2017-01-03 DIAGNOSIS — M5136 Other intervertebral disc degeneration, lumbar region: Secondary | ICD-10-CM | POA: Diagnosis not present

## 2017-01-05 ENCOUNTER — Encounter: Payer: Self-pay | Admitting: Family Medicine

## 2017-01-05 ENCOUNTER — Ambulatory Visit (INDEPENDENT_AMBULATORY_CARE_PROVIDER_SITE_OTHER): Payer: BLUE CROSS/BLUE SHIELD | Admitting: Family Medicine

## 2017-01-05 VITALS — BP 122/86 | HR 65 | Temp 98.0°F | Ht 68.0 in | Wt 212.0 lb

## 2017-01-05 DIAGNOSIS — K1370 Unspecified lesions of oral mucosa: Secondary | ICD-10-CM

## 2017-01-05 DIAGNOSIS — R220 Localized swelling, mass and lump, head: Secondary | ICD-10-CM

## 2017-01-05 DIAGNOSIS — K1379 Other lesions of oral mucosa: Secondary | ICD-10-CM

## 2017-01-05 MED ORDER — ALPRAZOLAM 0.5 MG PO TABS: 0.5000 mg | ORAL_TABLET | Freq: Every evening | ORAL | 0 refills | Status: DC | PRN

## 2017-01-05 NOTE — Progress Notes (Signed)
BP 122/86   Pulse 65   Temp 98 F (36.7 C) (Oral)   Ht  (1.727 m)   Wt 212 lb (96.2 kg)   BMI 32.23 kg/m    Subjective:    Patient ID: [name], female    DOB: 1950/08/29, 66 y.o.   MRN: [id number]  HPI: [name] is a 66 y.o. female presenting on 01/05/2017 for Knot on right side of jaw (causing pain to go up jaw line and in to ear; saw dentist last week and he prescribed Amoxiciillin 250 mg tid, on day 7 and no better, knot feels like a ball sized grape)   HPI Knot and right side of jaw Patient noticed a knot in the right side of her jaw inside of her mouth. The back of her teeth about 7 days ago and went and saw her dentist who was thinking of sending her to an oral surgeon but did give her an antibiotic and a muscle relaxer to help and she has been taking those without much success and no change in the knot itself. She wanted to come in here today to see what was going on and see if we have any recommendations. She denies any fevers or chills or drainage from the site. She has a little bit of soreness around the actual spot but no real pain. She's not had any cough or congestion or sore throat.  Relevant past medical, surgical, family and social history reviewed and updated as indicated. Interim medical history since our last visit reviewed. Allergies and medications reviewed and updated.  Review of Systems  Constitutional: Negative for chills and fever.  HENT: Negative for congestion, ear discharge, ear pain, sore throat and trouble swallowing.   Respiratory: Negative for chest tightness and shortness of breath.   Cardiovascular: Negative for chest pain and leg swelling.  Neurological: Negative for light-headedness and headaches.  Psychiatric/Behavioral: Negative for agitation and behavioral problems.  All other systems reviewed and are negative.   Per HPI unless specifically indicated above        Objective:    BP 122/86   Pulse 65   Temp 98 F (36.7 C)  (Oral)   Ht  (1.727 m)   Wt 212 lb (96.2 kg)   BMI 32.23 kg/m   Wt Readings from Last 3 Encounters:  01/05/17 212 lb (96.2 kg)  10/06/16 209 lb (94.8 kg)  07/18/16 204 lb (92.5 kg)    Physical Exam  Constitutional: She is oriented to person, place, and time. She appears well-developed and well-nourished. No distress.  HENT:  Mouth/Throat:    Eyes: Conjunctivae are normal.  Cardiovascular: Normal rate, regular rhythm, normal heart sounds and intact distal pulses.   No murmur heard. Pulmonary/Chest: Effort normal and breath sounds normal. No respiratory distress. She has no wheezes. She has no rales.  Musculoskeletal: Normal range of motion. She exhibits no edema.  Neurological: She is alert and oriented to person, place, and time. Coordination normal.  Skin: Skin is warm and dry. No rash noted. She is not diaphoretic.  Psychiatric: She has a normal mood and affect. Her behavior is normal.  Nursing note and vitals reviewed.       Assessment & Plan:   Problem List Items Addressed This Visit    None    Visit Diagnoses    Oral mass    -  Primary   Just behind the back of her right molars, mobile and slightly tender, on  amoxicillin and Robaxin and naproxen   Localized swelling, mass, and lump of head       Relevant Orders   CT SOFT TISSUE NECK W WO CONTRAST       Follow up plan: Return if symptoms worsen or fail to improve.  Counseling provided for all of the vaccine components Orders Placed This Encounter  Procedures  . CT SOFT TISSUE NECK W WO CONTRAST    Arville CareJoshua Heidee Audi, MD [facility] 01/05/2017, 4:19 PM

## 2017-01-10 ENCOUNTER — Telehealth: Payer: Self-pay | Admitting: *Deleted

## 2017-01-10 NOTE — Telephone Encounter (Signed)
Patient states that she no longer needs CT scan that was ordered for the knot on the inside of her right jaw.  She states she has been taking amoxicillin that her dentist prescribed for her and the knot has gone down.  Will notify referral department.

## 2017-01-11 DIAGNOSIS — E782 Mixed hyperlipidemia: Secondary | ICD-10-CM | POA: Diagnosis not present

## 2017-01-11 DIAGNOSIS — E039 Hypothyroidism, unspecified: Secondary | ICD-10-CM | POA: Diagnosis not present

## 2017-01-11 DIAGNOSIS — Z79899 Other long term (current) drug therapy: Secondary | ICD-10-CM | POA: Diagnosis not present

## 2017-01-11 DIAGNOSIS — Z139 Encounter for screening, unspecified: Secondary | ICD-10-CM | POA: Diagnosis not present

## 2017-01-11 DIAGNOSIS — E1169 Type 2 diabetes mellitus with other specified complication: Secondary | ICD-10-CM | POA: Diagnosis not present

## 2017-01-12 ENCOUNTER — Other Ambulatory Visit: Payer: BLUE CROSS/BLUE SHIELD

## 2017-01-17 DIAGNOSIS — E669 Obesity, unspecified: Secondary | ICD-10-CM | POA: Diagnosis not present

## 2017-01-17 DIAGNOSIS — E782 Mixed hyperlipidemia: Secondary | ICD-10-CM | POA: Diagnosis not present

## 2017-01-17 DIAGNOSIS — E039 Hypothyroidism, unspecified: Secondary | ICD-10-CM | POA: Diagnosis not present

## 2017-01-17 DIAGNOSIS — E1169 Type 2 diabetes mellitus with other specified complication: Secondary | ICD-10-CM | POA: Diagnosis not present

## 2017-01-17 DIAGNOSIS — Z008 Encounter for other general examination: Secondary | ICD-10-CM | POA: Diagnosis not present

## 2017-01-26 DIAGNOSIS — M5136 Other intervertebral disc degeneration, lumbar region: Secondary | ICD-10-CM | POA: Diagnosis not present

## 2017-01-26 DIAGNOSIS — M48062 Spinal stenosis, lumbar region with neurogenic claudication: Secondary | ICD-10-CM | POA: Diagnosis not present

## 2017-01-26 DIAGNOSIS — M5126 Other intervertebral disc displacement, lumbar region: Secondary | ICD-10-CM | POA: Diagnosis not present

## 2017-01-26 DIAGNOSIS — M5432 Sciatica, left side: Secondary | ICD-10-CM | POA: Diagnosis not present

## 2017-01-30 DIAGNOSIS — J0100 Acute maxillary sinusitis, unspecified: Secondary | ICD-10-CM | POA: Diagnosis not present

## 2017-02-08 ENCOUNTER — Telehealth: Payer: Self-pay

## 2017-02-08 NOTE — Telephone Encounter (Signed)
appt made for f/u 

## 2017-02-08 NOTE — Telephone Encounter (Signed)
Dropped off a surgical clearance form last week for Dr Shelle Iron  Has it been signed?

## 2017-02-13 ENCOUNTER — Encounter: Payer: Self-pay | Admitting: Family Medicine

## 2017-02-13 ENCOUNTER — Ambulatory Visit (INDEPENDENT_AMBULATORY_CARE_PROVIDER_SITE_OTHER): Payer: BLUE CROSS/BLUE SHIELD | Admitting: Family Medicine

## 2017-02-13 VITALS — BP 127/79 | HR 73 | Temp 99.2°F | Ht 68.0 in | Wt 211.0 lb

## 2017-02-13 DIAGNOSIS — Z01818 Encounter for other preprocedural examination: Secondary | ICD-10-CM

## 2017-02-13 NOTE — Progress Notes (Signed)
BP 127/79   Pulse 73   Temp 99.2 F (37.3 C) (Oral)   Ht  (1.727 m)   Wt 211 lb (95.7 kg)   BMI 32.08 kg/m    Subjective:    Patient ID: [name], female    DOB: 04-Apr-1951, 66 y.o.   MRN: [id number]  HPI: [name] is a 66 y.o. female presenting on 02/13/2017 for Surgical clearance   HPI Presurgical clearance Patient is coming in today for presurgical clearance for a spinal surgery with Dr. Jillyn Hidden. She says she is going for disc decompression surgery and removal of bone spurs. She had her labs done at work on 01/11/2017 which show triglycerides of 172 and a glucose of 118 and normal renal function and otherwise normal cholesterol and normal liver function and normal CBC and hemoglobin A1c of 7.1. She also had a vitamin B12 and vitamin D that were normal and the thyroid that was normal. She denies any issues with shortness of breath or chest pain or congestion. She is able to perform her daily duties without getting winded and the only thing that limits her as her back pain which has radicular symptoms going down her leg. Patient denies any chest pain, shortness of breath, headaches or vision issues, abdominal complaints, diarrhea, nausea, vomiting, or joint issues.   Relevant past medical, surgical, family and social history reviewed and updated as indicated. Interim medical history since our last visit reviewed. Allergies and medications reviewed and updated.  Review of Systems  Constitutional: Negative for chills and fever.  HENT: Negative for ear pain and tinnitus.   Eyes: Negative for pain.  Respiratory: Negative for cough, shortness of breath and wheezing.   Cardiovascular: Negative for chest pain, palpitations and leg swelling.  Gastrointestinal: Negative for abdominal pain, blood in stool, constipation and diarrhea.  Genitourinary: Negative for dysuria and hematuria.  Musculoskeletal: Positive for back pain and myalgias.  Skin: Negative for rash.  Neurological:  Positive for numbness. Negative for dizziness, weakness and headaches.  Psychiatric/Behavioral: Negative for suicidal ideas.    Per HPI unless specifically indicated above        Objective:    BP 127/79   Pulse 73   Temp 99.2 F (37.3 C) (Oral)   Ht  (1.727 m)   Wt 211 lb (95.7 kg)   BMI 32.08 kg/m   Wt Readings from Last 3 Encounters:  02/13/17 211 lb (95.7 kg)  01/05/17 212 lb (96.2 kg)  10/06/16 209 lb (94.8 kg)    Physical Exam  Constitutional: She is oriented to person, place, and time. She appears well-developed and well-nourished. No distress.  Eyes: Conjunctivae are normal.  Neck: Neck supple. No thyromegaly present.  Cardiovascular: Normal rate, regular rhythm, normal heart sounds and intact distal pulses.   No murmur heard. Pulmonary/Chest: Effort normal and breath sounds normal. No respiratory distress. She has no wheezes. She has no rales.  Musculoskeletal: Normal range of motion. She exhibits no edema or tenderness.  Lymphadenopathy:    She has no cervical adenopathy.  Neurological: She is alert and oriented to person, place, and time. Coordination normal.  Skin: Skin is warm and dry. No rash noted. She is not diaphoretic.  Psychiatric: She has a normal mood and affect. Her behavior is normal.  Nursing note and vitals reviewed.  EKG: Normal sinus    Assessment & Plan:   Problem List Items Addressed This Visit    None    Visit Diagnoses  Encounter for preoperative examination for general surgical procedure    -  Primary   Relevant Orders   EKG 12-Lead (Completed)       Follow up plan: Return if symptoms worsen or fail to improve.  Counseling provided for all of the vaccine components Orders Placed This Encounter  Procedures  . EKG 12-Lead    Arville Care, MD [facility] 02/13/2017, 4:38 PM

## 2017-02-15 ENCOUNTER — Ambulatory Visit: Payer: Self-pay | Admitting: Orthopedic Surgery

## 2017-02-21 ENCOUNTER — Ambulatory Visit: Payer: Self-pay | Admitting: Orthopedic Surgery

## 2017-02-21 NOTE — H&P (View-Only) (Signed)
[name] is an 66 y.o. female.   Chief Complaint: back and leg pain HPI: The patient is a 66 year old female who presents today for follow up of their back. The patient is being followed for their back pain. They are now 4 month(s) out from when symptoms began. Symptoms reported today include: pain, leg pain (bilateral) and pain with sitting. Current treatment includes: activity modification, NSAIDs and Gabapentin and Robaxin. The following medication has been used for pain control: antiinflammatory medication. The patient reports their current pain level to be 3 / 10 (but can be 8/10). The patient presents today following ESI (L4-5). The patient has not gotten any relief of their symptoms with Cortisone injections.  Note:Patient reports minimal relief from her epidural. She has back pain when she sits long periods of time and then burning and leg pain. Pain that radiates into the bottom of her feet numbness and tingling.  She is taking gabapentin at night as well as Robaxin and Aleve.  She is 4 months status post her injury.  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 When she stands especially in the kitchen the pain radiated down under her thighs and bottom of her feet. She can bend over and sit for relief as well.  She does have a history of diabetes. Her blood sugars went up over 400 with the injection.  No fevers or chills.  Past Medical History:  Diagnosis Date  . Diabetes mellitus   . GERD (gastroesophageal reflux disease)   . Hyperlipidemia   . Hypertension   . Hypothyroidism     Past Surgical History:  Procedure Laterality Date  . APPENDECTOMY    . CARPAL TUNNEL RELEASE    . CHOLECYSTECTOMY    . KNEE ARTHROSCOPY     left  . PARTIAL HYSTERECTOMY      Family History  Problem Relation Age of Onset  . Heart disease Mother   . Heart disease Father   . Colon cancer Father   . Brain cancer Father   . Lung  cancer Maternal Uncle        smoked  . Lung cancer Maternal Uncle        smoked   Social History:  reports that she has never smoked. She has never used smokeless tobacco. She reports that she does not drink alcohol or use drugs.  Allergies:  Allergies  Allergen Reactions  . Dilaudid [Hydromorphone Hcl]   . Hydromorphone Other (See Comments)     (Not in a hospital admission)  No results found for this or any previous visit (from the past 48 hour(s)). No results found.  Review of Systems  Constitutional: Negative.   HENT: Negative.   Eyes: Negative.   Respiratory: Negative.   Cardiovascular: Negative.   Gastrointestinal: Negative.   Genitourinary: Negative.   Musculoskeletal: Positive for back pain.  Skin: Negative.   Neurological: Positive for sensory change and focal weakness.  Psychiatric/Behavioral: Negative.     There were no vitals taken for this visit. Physical Exam  Constitutional: She is oriented to person, place, and time. She appears well-developed.  HENT:  Head: Normocephalic.  Eyes: Pupils are equal, round, and reactive to light.  Neck: Normal range of motion.  Cardiovascular: Normal rate.   Respiratory: Effort normal.  GI: Soft.  Musculoskeletal:  The physical exam findings are as follows: Note:I seated position is tender lumbosacral junction straight leg raise  is buttock and thigh pain bilaterally. Altered sensation in the plantar aspect of the feet. EHLs 5-/5 bilaterally.  No DVT. Hyporeflexic. Pulses intact. No instability hips knees and ankles.  Neurological: She is alert and oriented to person, place, and time.  Skin: Skin is warm and dry.    X-rays: AP lateral flexion-extension demonstrates disc degeneration 4551. Osteopenia.  MRI: Demonstrates severe lateral recess stenosis at 4-5 right greater than left. At L5-S1 there is a lateral recess stenosis secondary to facet hypertrophy and neuroforaminal stenosis L5-S1 on the left less so on  the right.  Assessment/Plan Bilateral L5 radiculopathy secondary to severe lateral recess stenosis at L4-5 extending down into L5-S1. With lateral recess stenosis at L5-S1 on the left greater than the right. Underlining EHL weakness. Discogenic mechanical back pain. Osteopenia possible osteoporosis. Possible peripheral neuropathy early.  Plan: We did extensive discussion concerning current pathology relevant anatomy treatment options. Avoid extension. Activity modification.  Does not appear any further epidural be particularly beneficial to her also the elevation of her blood glucose.  We discussed living with his symptoms with activity modification analgesics.  Surgical options would include decompression at L4-5 and 5 1 with or without a concomitant fusion.  At this point we mutually agreed to forego the concomitant fusion. Discussed a decompression at those 2 levels I would suggest a removal of the neural arch at 5 to decompress 4 5 and 5 1. And foraminotomies of L5 bilaterally.  We discussed risks and benefits including bleeding infection. Time in the hospital returned to work. Possible need for fusion. Also CSF leakage. Epidural fibrosis. We will require preoperative clearance. She like to proceed with that however I asked her to discuss this with her husband at home for some time and then call back if she would like to proceed with that.  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  Plan  microlumbar decompression L4-5, L5-S1  BISSELL, Dayna BarkerJACLYN M., PA-C for Dr. Shelle IronBeane 02/21/2017, 10:14 AM

## 2017-02-21 NOTE — H&P (Signed)
[name] is an 66 y.o. female.   Chief Complaint: back and leg pain HPI: The patient is a 66 year old female who presents today for follow up of their back. The patient is being followed for their back pain. They are now 4 month(s) out from when symptoms began. Symptoms reported today include: pain, leg pain (bilateral) and pain with sitting. Current treatment includes: activity modification, NSAIDs and Gabapentin and Robaxin. The following medication has been used for pain control: antiinflammatory medication. The patient reports their current pain level to be 3 / 10 (but can be 8/10). The patient presents today following ESI (L4-5). The patient has not gotten any relief of their symptoms with Cortisone injections.  Note:Patient reports minimal relief from her epidural. She has back pain when she sits long periods of time and then burning and leg pain. Pain that radiates into the bottom of her feet numbness and tingling.  She is taking gabapentin at night as well as Robaxin and Aleve.  She is 4 months status post her injury.  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 When she stands especially in the kitchen the pain radiated down under her thighs and bottom of her feet. She can bend over and sit for relief as well.  She does have a history of diabetes. Her blood sugars went up over 400 with the injection.  No fevers or chills.  Past Medical History:  Diagnosis Date  . Diabetes mellitus   . GERD (gastroesophageal reflux disease)   . Hyperlipidemia   . Hypertension   . Hypothyroidism     Past Surgical History:  Procedure Laterality Date  . APPENDECTOMY    . CARPAL TUNNEL RELEASE    . CHOLECYSTECTOMY    . KNEE ARTHROSCOPY     left  . PARTIAL HYSTERECTOMY      Family History  Problem Relation Age of Onset  . Heart disease Mother   . Heart disease Father   . Colon cancer Father   . Brain cancer Father   . Lung  cancer Maternal Uncle        smoked  . Lung cancer Maternal Uncle        smoked   Social History:  reports that she has never smoked. She has never used smokeless tobacco. She reports that she does not drink alcohol or use drugs.  Allergies:  Allergies  Allergen Reactions  . Dilaudid [Hydromorphone Hcl]   . Hydromorphone Other (See Comments)     (Not in a hospital admission)  No results found for this or any previous visit (from the past 48 hour(s)). No results found.  Review of Systems  Constitutional: Negative.   HENT: Negative.   Eyes: Negative.   Respiratory: Negative.   Cardiovascular: Negative.   Gastrointestinal: Negative.   Genitourinary: Negative.   Musculoskeletal: Positive for back pain.  Skin: Negative.   Neurological: Positive for sensory change and focal weakness.  Psychiatric/Behavioral: Negative.     There were no vitals taken for this visit. Physical Exam  Constitutional: She is oriented to person, place, and time. She appears well-developed.  HENT:  Head: Normocephalic.  Eyes: Pupils are equal, round, and reactive to light.  Neck: Normal range of motion.  Cardiovascular: Normal rate.   Respiratory: Effort normal.  GI: Soft.  Musculoskeletal:  The physical exam findings are as follows: Note:I seated position is tender lumbosacral junction straight leg raise  is buttock and thigh pain bilaterally. Altered sensation in the plantar aspect of the feet. EHLs 5-/5 bilaterally.  No DVT. Hyporeflexic. Pulses intact. No instability hips knees and ankles.  Neurological: She is alert and oriented to person, place, and time.  Skin: Skin is warm and dry.    X-rays: AP lateral flexion-extension demonstrates disc degeneration 4551. Osteopenia.  MRI: Demonstrates severe lateral recess stenosis at 4-5 right greater than left. At L5-S1 there is a lateral recess stenosis secondary to facet hypertrophy and neuroforaminal stenosis L5-S1 on the left less so on  the right.  Assessment/Plan Bilateral L5 radiculopathy secondary to severe lateral recess stenosis at L4-5 extending down into L5-S1. With lateral recess stenosis at L5-S1 on the left greater than the right. Underlining EHL weakness. Discogenic mechanical back pain. Osteopenia possible osteoporosis. Possible peripheral neuropathy early.  Plan: We did extensive discussion concerning current pathology relevant anatomy treatment options. Avoid extension. Activity modification.  Does not appear any further epidural be particularly beneficial to her also the elevation of her blood glucose.  We discussed living with his symptoms with activity modification analgesics.  Surgical options would include decompression at L4-5 and 5 1 with or without a concomitant fusion.  At this point we mutually agreed to forego the concomitant fusion. Discussed a decompression at those 2 levels I would suggest a removal of the neural arch at 5 to decompress 4 5 and 5 1. And foraminotomies of L5 bilaterally.  We discussed risks and benefits including bleeding infection. Time in the hospital returned to work. Possible need for fusion. Also CSF leakage. Epidural fibrosis. We will require preoperative clearance. She like to proceed with that however I asked her to discuss this with her husband at home for some time and then call back if she would like to proceed with that.  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  Plan  microlumbar decompression L4-5, L5-S1  BISSELL, Dayna BarkerJACLYN M., PA-C for Dr. Shelle IronBeane 02/21/2017, 10:14 AM

## 2017-03-06 DIAGNOSIS — Z008 Encounter for other general examination: Secondary | ICD-10-CM | POA: Diagnosis not present

## 2017-03-06 DIAGNOSIS — E669 Obesity, unspecified: Secondary | ICD-10-CM | POA: Diagnosis not present

## 2017-03-06 DIAGNOSIS — E039 Hypothyroidism, unspecified: Secondary | ICD-10-CM | POA: Diagnosis not present

## 2017-03-06 DIAGNOSIS — E782 Mixed hyperlipidemia: Secondary | ICD-10-CM | POA: Diagnosis not present

## 2017-03-06 DIAGNOSIS — E1169 Type 2 diabetes mellitus with other specified complication: Secondary | ICD-10-CM | POA: Diagnosis not present

## 2017-03-09 NOTE — Patient Instructions (Signed)
Manfred ArchKathy B Auzenne  03/09/2017   Your procedure is scheduled on: 03/15/17    [facility]  [place] to 3rd floor to  Short Stay Center at   0630 AM.    Call this number if you have problems the morning of surgery [phone]    Remember: ONLY 1 PERSON MAY GO WITH YOU TO SHORT STAY TO GET  READY MORNING OF YOUR SURGERY.  Do not eat food or drink liquids :After Midnight.     Take these medicines the morning of surgery with A SIP OF WATER: eye drops as usual, metoprolol ( lopressor), synthroid, nexium                                 You may not have any metal on your body including hair pins and              piercings  Do not wear jewelry, make-up, lotions, powders or perfumes, deodorant             Do not wear nail polish.  Do not shave  48 hours prior to surgery.                 Do not bring valuables to the hospital. [facility] IS NOT             RESPONSIBLE   FOR VALUABLES.  Contacts, dentures or bridgework may not be worn into surgery.  Leave suitcase in the car. After surgery it may be brought to your room.                       Please read over the following fact sheets you were given: _____________________________________________________________________             [facility] Pre[facility] Before surgery, you can play an important role.  Because skin is not sterile, your skin needs to be as free of germs as possible.  You can reduce the number of germs on your skin by washing with CHG (chlorahexidine gluconate) soap before surgery.  CHG is an antiseptic cleaner which kills germs and bonds with the skin to continue killing germs even after washing. Please DO NOT use if you have an allergy to CHG or antibacterial soaps.  If your skin becomes reddened/irritated stop using the CHG and inform your nurse when you arrive at Short Stay. Do not shave (including legs and underarms) for at least 48 hours prior to  the first CHG shower.  You may shave your face/neck. Please follow these instructions carefully:  1.  Shower with CHG Soap the night before surgery and the  morning of Surgery.  2.  If you choose to wash your hair, wash your hair first as usual with your  normal  shampoo.  3.  After you shampoo, rinse your hair and body thoroughly to remove the  shampoo.                           4.  Use CHG as you would any other liquid soap.  You can apply chg directly  to the skin and wash  Gently with a scrungie or clean washcloth.  5.  Apply the CHG Soap to your body ONLY FROM THE NECK DOWN.   Do not use on face/ open                           Wound or open sores. Avoid contact with eyes, ears mouth and genitals (private parts).                       Wash face,  Genitals (private parts) with your normal soap.             6.  Wash thoroughly, paying special attention to the area where your surgery  will be performed.  7.  Thoroughly rinse your body with warm water from the neck down.  8.  DO NOT shower/wash with your normal soap after using and rinsing off  the CHG Soap.                9.  Pat yourself dry with a clean towel.            10.  Wear clean pajamas.            11.  Place clean sheets on your bed the night of your first shower and do not  sleep with pets. Day of Surgery : Do not apply any lotions/deodorants the morning of surgery.  Please wear clean clothes to the hospital/surgery center.  FAILURE TO FOLLOW THESE INSTRUCTIONS MAY RESULT IN THE CANCELLATION OF YOUR SURGERY PATIENT SIGNATURE_________________________________  NURSE SIGNATURE__________________________________  ________________________________________________________________________

## 2017-03-10 ENCOUNTER — Ambulatory Visit: Payer: Self-pay | Admitting: Orthopedic Surgery

## 2017-03-10 ENCOUNTER — Encounter (HOSPITAL_COMMUNITY): Payer: Self-pay

## 2017-03-10 ENCOUNTER — Encounter (HOSPITAL_COMMUNITY)
Admission: RE | Admit: 2017-03-10 | Discharge: 2017-03-10 | Disposition: A | Discharge status: Home / Self Care | Payer: BLUE CROSS/BLUE SHIELD | Source: Ambulatory Visit | Attending: Specialist | Admitting: Specialist

## 2017-03-10 ENCOUNTER — Ambulatory Visit (HOSPITAL_COMMUNITY)
Admission: RE | Admit: 2017-03-10 | Discharge: 2017-03-10 | Disposition: A | Discharge status: Home / Self Care | Payer: BLUE CROSS/BLUE SHIELD | Source: Ambulatory Visit | Attending: Orthopedic Surgery | Admitting: Orthopedic Surgery

## 2017-03-10 ENCOUNTER — Other Ambulatory Visit: Payer: Self-pay

## 2017-03-10 DIAGNOSIS — Z01812 Encounter for preprocedural laboratory examination: Secondary | ICD-10-CM | POA: Diagnosis not present

## 2017-03-10 DIAGNOSIS — M4800 Spinal stenosis, site unspecified: Secondary | ICD-10-CM | POA: Diagnosis not present

## 2017-03-10 DIAGNOSIS — M5126 Other intervertebral disc displacement, lumbar region: Secondary | ICD-10-CM

## 2017-03-10 DIAGNOSIS — Z01818 Encounter for other preprocedural examination: Secondary | ICD-10-CM | POA: Diagnosis not present

## 2017-03-10 DIAGNOSIS — M519 Unspecified thoracic, thoracolumbar and lumbosacral intervertebral disc disorder: Secondary | ICD-10-CM | POA: Insufficient documentation

## 2017-03-10 DIAGNOSIS — M47816 Spondylosis without myelopathy or radiculopathy, lumbar region: Secondary | ICD-10-CM | POA: Diagnosis not present

## 2017-03-10 DIAGNOSIS — M48061 Spinal stenosis, lumbar region without neurogenic claudication: Secondary | ICD-10-CM | POA: Diagnosis not present

## 2017-03-10 HISTORY — DX: Anxiety disorder, unspecified: F41.9

## 2017-03-10 HISTORY — DX: Cardiac arrhythmia, unspecified: I49.9

## 2017-03-10 HISTORY — DX: Myoneural disorder, unspecified: G70.9

## 2017-03-10 LAB — CBC
HCT: 45 % (ref 36.0–46.0)
Hemoglobin: 14.7 g/dL (ref 12.0–15.0)
MCH: 28.5 pg (ref 26.0–34.0)
MCHC: 32.7 g/dL (ref 30.0–36.0)
MCV: 87.4 fL (ref 78.0–100.0)
PLATELETS: 273 10*3/uL (ref 150–400)
RBC: 5.15 MIL/uL — ABNORMAL HIGH (ref 3.87–5.11)
RDW: 14 % (ref 11.5–15.5)
WBC: 9.1 10*3/uL (ref 4.0–10.5)

## 2017-03-10 LAB — BASIC METABOLIC PANEL
Anion gap: 10 (ref 5–15)
BUN: 14 mg/dL (ref 6–20)
CALCIUM: 9.8 mg/dL (ref 8.9–10.3)
CO2: 26 mmol/L (ref 22–32)
CREATININE: 0.75 mg/dL (ref 0.44–1.00)
Chloride: 104 mmol/L (ref 101–111)
GFR calc Af Amer: >60 mL/min (ref >60)
GLUCOSE: 203 mg/dL — AB (ref 65–99)
Potassium: 4.7 mmol/L (ref 3.5–5.1)
Sodium: 140 mmol/L (ref 135–145)

## 2017-03-10 LAB — HEMOGLOBIN A1C
HEMOGLOBIN A1C: 7.3 % — AB (ref 4.8–5.6)
Mean Plasma Glucose: 162.81 mg/dL

## 2017-03-10 LAB — GLUCOSE, CAPILLARY: Glucose-Capillary: 234 mg/dL — ABNORMAL HIGH (ref 65–99)

## 2017-03-10 LAB — SURGICAL PCR SCREEN
MRSA, PCR: NEGATIVE
Staphylococcus aureus: NEGATIVE

## 2017-03-10 NOTE — Progress Notes (Signed)
02/13/17-epic 02/13/17-DR dettinger preop eval -epic

## 2017-03-14 NOTE — Anesthesia Preprocedure Evaluation (Addendum)
Anesthesia Evaluation  Patient identified by MRN, date of birth, ID band Patient awake    Reviewed: Allergy & Precautions, NPO status , Patient's Chart, lab work & pertinent test results, reviewed documented beta blocker date and time   Airway Mallampati: I  TM Distance: >3 FB Neck ROM: Full    Dental  (+) Dental Advisory Given, Teeth Intact   Pulmonary neg pulmonary ROS, neg COPD,    Pulmonary exam normal breath sounds clear to auscultation       Cardiovascular hypertension, Pt. on medications (-) Past MI Normal cardiovascular exam Rhythm:Regular Rate:Normal     Neuro/Psych Anxiety negative neurological ROS     GI/Hepatic Neg liver ROS, GERD  Controlled and Medicated,  Endo/Other  diabetes, Type 2, Oral Hypoglycemic AgentsHypothyroidism Obesity  Renal/GU negative Renal ROS  negative genitourinary   Musculoskeletal negative musculoskeletal ROS (+)   Abdominal   Peds  Hematology negative hematology ROS (+)   Anesthesia Other Findings   Reproductive/Obstetrics                            Anesthesia Physical Anesthesia Plan  ASA: II  Anesthesia Plan: General   Post-op Pain Management:    Induction: Intravenous  PONV Risk Score and Plan: 4 or greater and Treatment may vary due to age or medical condition, Dexamethasone, Ondansetron and Midazolam  Airway Management Planned: Oral ETT  Additional Equipment: None  Intra-op Plan:   Post-operative Plan: Extubation in OR  Informed Consent: I have reviewed the patients History and Physical, chart, labs and discussed the procedure including the risks, benefits and alternatives for the proposed anesthesia with the patient or authorized representative who has indicated his/her understanding and acceptance.   Dental advisory given  Plan Discussed with: CRNA  Anesthesia Plan Comments:         Anesthesia Quick Evaluation

## 2017-03-15 ENCOUNTER — Ambulatory Visit (HOSPITAL_COMMUNITY)
Admission: RE | Admit: 2017-03-15 | Discharge: 2017-03-16 | Disposition: A | Discharge status: Home / Self Care | Payer: BLUE CROSS/BLUE SHIELD | Source: Ambulatory Visit | Attending: Specialist | Admitting: Specialist

## 2017-03-15 ENCOUNTER — Other Ambulatory Visit: Payer: Self-pay

## 2017-03-15 ENCOUNTER — Ambulatory Visit (HOSPITAL_COMMUNITY): Payer: BLUE CROSS/BLUE SHIELD

## 2017-03-15 ENCOUNTER — Encounter (HOSPITAL_COMMUNITY)
Admission: RE | Disposition: A | Discharge status: Home / Self Care | Payer: Self-pay | Source: Ambulatory Visit | Attending: Specialist

## 2017-03-15 ENCOUNTER — Encounter (HOSPITAL_COMMUNITY): Payer: Self-pay | Admitting: *Deleted

## 2017-03-15 ENCOUNTER — Ambulatory Visit (HOSPITAL_COMMUNITY): Payer: BLUE CROSS/BLUE SHIELD | Admitting: Certified Registered Nurse Anesthetist

## 2017-03-15 DIAGNOSIS — Z800 Family history of malignant neoplasm of digestive organs: Secondary | ICD-10-CM | POA: Diagnosis not present

## 2017-03-15 DIAGNOSIS — Z79899 Other long term (current) drug therapy: Secondary | ICD-10-CM | POA: Diagnosis not present

## 2017-03-15 DIAGNOSIS — K219 Gastro-esophageal reflux disease without esophagitis: Secondary | ICD-10-CM | POA: Diagnosis not present

## 2017-03-15 DIAGNOSIS — Z6830 Body mass index (BMI) 30.0-30.9, adult: Secondary | ICD-10-CM | POA: Insufficient documentation

## 2017-03-15 DIAGNOSIS — Z7984 Long term (current) use of oral hypoglycemic drugs: Secondary | ICD-10-CM | POA: Diagnosis not present

## 2017-03-15 DIAGNOSIS — Z9049 Acquired absence of other specified parts of digestive tract: Secondary | ICD-10-CM | POA: Diagnosis not present

## 2017-03-15 DIAGNOSIS — I10 Essential (primary) hypertension: Secondary | ICD-10-CM | POA: Diagnosis not present

## 2017-03-15 DIAGNOSIS — M7701 Medial epicondylitis, right elbow: Secondary | ICD-10-CM

## 2017-03-15 DIAGNOSIS — E119 Type 2 diabetes mellitus without complications: Secondary | ICD-10-CM | POA: Insufficient documentation

## 2017-03-15 DIAGNOSIS — Z8249 Family history of ischemic heart disease and other diseases of the circulatory system: Secondary | ICD-10-CM | POA: Diagnosis not present

## 2017-03-15 DIAGNOSIS — E559 Vitamin D deficiency, unspecified: Secondary | ICD-10-CM | POA: Diagnosis not present

## 2017-03-15 DIAGNOSIS — E669 Obesity, unspecified: Secondary | ICD-10-CM | POA: Insufficient documentation

## 2017-03-15 DIAGNOSIS — M4807 Spinal stenosis, lumbosacral region: Secondary | ICD-10-CM | POA: Diagnosis not present

## 2017-03-15 DIAGNOSIS — E039 Hypothyroidism, unspecified: Secondary | ICD-10-CM | POA: Diagnosis not present

## 2017-03-15 DIAGNOSIS — Z801 Family history of malignant neoplasm of trachea, bronchus and lung: Secondary | ICD-10-CM | POA: Insufficient documentation

## 2017-03-15 DIAGNOSIS — Z981 Arthrodesis status: Secondary | ICD-10-CM | POA: Diagnosis not present

## 2017-03-15 DIAGNOSIS — M8580 Other specified disorders of bone density and structure, unspecified site: Secondary | ICD-10-CM | POA: Diagnosis not present

## 2017-03-15 DIAGNOSIS — M5416 Radiculopathy, lumbar region: Secondary | ICD-10-CM | POA: Diagnosis not present

## 2017-03-15 DIAGNOSIS — Z885 Allergy status to narcotic agent status: Secondary | ICD-10-CM | POA: Insufficient documentation

## 2017-03-15 DIAGNOSIS — E785 Hyperlipidemia, unspecified: Secondary | ICD-10-CM | POA: Diagnosis not present

## 2017-03-15 DIAGNOSIS — M48062 Spinal stenosis, lumbar region with neurogenic claudication: Secondary | ICD-10-CM | POA: Diagnosis not present

## 2017-03-15 DIAGNOSIS — M48061 Spinal stenosis, lumbar region without neurogenic claudication: Secondary | ICD-10-CM | POA: Diagnosis present

## 2017-03-15 DIAGNOSIS — Z79890 Hormone replacement therapy (postmenopausal): Secondary | ICD-10-CM | POA: Diagnosis not present

## 2017-03-15 DIAGNOSIS — E782 Mixed hyperlipidemia: Secondary | ICD-10-CM | POA: Diagnosis not present

## 2017-03-15 DIAGNOSIS — Z419 Encounter for procedure for purposes other than remedying health state, unspecified: Secondary | ICD-10-CM

## 2017-03-15 DIAGNOSIS — Z90711 Acquired absence of uterus with remaining cervical stump: Secondary | ICD-10-CM | POA: Diagnosis not present

## 2017-03-15 DIAGNOSIS — F419 Anxiety disorder, unspecified: Secondary | ICD-10-CM | POA: Insufficient documentation

## 2017-03-15 HISTORY — PX: LUMBAR LAMINECTOMY/DECOMPRESSION MICRODISCECTOMY: SHX5026

## 2017-03-15 LAB — GLUCOSE, CAPILLARY
GLUCOSE-CAPILLARY: 139 mg/dL — AB (ref 65–99)
GLUCOSE-CAPILLARY: 167 mg/dL — AB (ref 65–99)
GLUCOSE-CAPILLARY: 191 mg/dL — AB (ref 65–99)
GLUCOSE-CAPILLARY: 152 mg/dL — AB (ref 65–99)

## 2017-03-15 LAB — GLUCOSE, RANDOM: GLUCOSE: 179 mg/dL — AB (ref 65–99)

## 2017-03-15 SURGERY — LUMBAR LAMINECTOMY/DECOMPRESSION MICRODISCECTOMY 2 LEVELS
Anesthesia: General

## 2017-03-15 MED ORDER — ALUM & MAG HYDROXIDE-SIMETH 200-200-20 MG/5ML PO SUSP: 30.0000 mL | Freq: Four times a day (QID) | ORAL | Status: DC | PRN

## 2017-03-15 MED ORDER — GABAPENTIN 300 MG PO CAPS: 300.0000 mg | ORAL_CAPSULE | Freq: Three times a day (TID) | ORAL | Status: DC | PRN

## 2017-03-15 MED ORDER — HYDROCODONE-ACETAMINOPHEN 7.5-325 MG PO TABS
2.0000 | ORAL_TABLET | ORAL | Status: DC | PRN
  Filled 2017-03-15: qty 2

## 2017-03-15 MED ORDER — MAGNESIUM OXIDE 400 (241.3 MG) MG PO TABS
400.0000 mg | ORAL_TABLET | Freq: Every day | ORAL | Status: DC
  Administered 2017-03-16: 400 mg via ORAL
  Filled 2017-03-15: qty 1

## 2017-03-15 MED ORDER — ACETAMINOPHEN 10 MG/ML IV SOLN
1000.0000 mg | INTRAVENOUS | Status: AC
  Administered 2017-03-15: 1000 mg via INTRAVENOUS

## 2017-03-15 MED ORDER — LIDOCAINE 2% (20 MG/ML) 5 ML SYRINGE
INTRAMUSCULAR | Status: AC
  Filled 2017-03-15: qty 5

## 2017-03-15 MED ORDER — FENTANYL CITRATE (PF) 100 MCG/2ML IJ SOLN
INTRAMUSCULAR | Status: AC
  Filled 2017-03-15: qty 2

## 2017-03-15 MED ORDER — LIDOCAINE 2% (20 MG/ML) 5 ML SYRINGE
INTRAMUSCULAR | Status: DC | PRN
  Administered 2017-03-15: 80 mg via INTRAVENOUS

## 2017-03-15 MED ORDER — GABAPENTIN 300 MG PO CAPS
300.0000 mg | ORAL_CAPSULE | Freq: Every day | ORAL | Status: DC
  Administered 2017-03-15: 300 mg via ORAL
  Filled 2017-03-15: qty 1

## 2017-03-15 MED ORDER — DOCUSATE SODIUM 100 MG PO CAPS
100.0000 mg | ORAL_CAPSULE | Freq: Two times a day (BID) | ORAL | 1 refills | Status: DC | PRN
Start: 2017-03-15 — End: 2021-09-20

## 2017-03-15 MED ORDER — DOCUSATE SODIUM 100 MG PO CAPS
100.0000 mg | ORAL_CAPSULE | Freq: Two times a day (BID) | ORAL | Status: DC
  Administered 2017-03-15 – 2017-03-16 (×2): 100 mg via ORAL
  Filled 2017-03-15 (×2): qty 1

## 2017-03-15 MED ORDER — METHOCARBAMOL 1000 MG/10ML IJ SOLN
500.0000 mg | Freq: Four times a day (QID) | INTRAVENOUS | Status: DC | PRN
  Administered 2017-03-15: 500 mg via INTRAVENOUS
  Filled 2017-03-15: qty 550

## 2017-03-15 MED ORDER — ROCURONIUM BROMIDE 50 MG/5ML IV SOSY
PREFILLED_SYRINGE | INTRAVENOUS | Status: AC
  Filled 2017-03-15: qty 5

## 2017-03-15 MED ORDER — MAGNESIUM CITRATE PO SOLN: 1.0000 | Freq: Once | ORAL | Status: DC | PRN

## 2017-03-15 MED ORDER — PHENYLEPHRINE HCL 10 MG/ML IJ SOLN
INTRAMUSCULAR | Status: DC | PRN
  Administered 2017-03-15 (×3): 80 ug via INTRAVENOUS
  Administered 2017-03-15: 40 ug via INTRAVENOUS
  Administered 2017-03-15 (×4): 80 ug via INTRAVENOUS

## 2017-03-15 MED ORDER — ACETAMINOPHEN 325 MG PO TABS: 650.0000 mg | ORAL_TABLET | ORAL | Status: DC | PRN

## 2017-03-15 MED ORDER — SODIUM CHLORIDE 0.9 % IV SOLN
INTRAVENOUS | Status: DC | PRN
  Administered 2017-03-15: 500 mL

## 2017-03-15 MED ORDER — ONDANSETRON HCL 4 MG/2ML IJ SOLN: 4.0000 mg | Freq: Four times a day (QID) | INTRAMUSCULAR | Status: DC | PRN

## 2017-03-15 MED ORDER — DEXAMETHASONE SODIUM PHOSPHATE 10 MG/ML IJ SOLN
INTRAMUSCULAR | Status: DC | PRN
  Administered 2017-03-15: 10 mg via INTRAVENOUS

## 2017-03-15 MED ORDER — METHOCARBAMOL 500 MG PO TABS
500.0000 mg | ORAL_TABLET | Freq: Four times a day (QID) | ORAL | Status: DC | PRN
  Administered 2017-03-15: 500 mg via ORAL
  Filled 2017-03-15: qty 1

## 2017-03-15 MED ORDER — BUPIVACAINE-EPINEPHRINE (PF) 0.5% -1:200000 IJ SOLN
INTRAMUSCULAR | Status: DC | PRN
  Administered 2017-03-15: 12 mL

## 2017-03-15 MED ORDER — ONDANSETRON HCL 4 MG PO TABS: 4.0000 mg | ORAL_TABLET | Freq: Four times a day (QID) | ORAL | Status: DC | PRN

## 2017-03-15 MED ORDER — FENTANYL CITRATE (PF) 100 MCG/2ML IJ SOLN
25.0000 ug | INTRAMUSCULAR | Status: DC | PRN
  Administered 2017-03-15: 50 ug via INTRAVENOUS

## 2017-03-15 MED ORDER — THROMBIN (RECOMBINANT) 5000 UNITS EX SOLR
CUTANEOUS | Status: AC
  Filled 2017-03-15: qty 10000

## 2017-03-15 MED ORDER — THROMBIN (RECOMBINANT) 5000 UNITS EX SOLR
CUTANEOUS | Status: DC | PRN
Start: 2017-03-15 — End: 2017-03-15
  Administered 2017-03-15: 10000 [IU] via TOPICAL

## 2017-03-15 MED ORDER — ROCURONIUM BROMIDE 50 MG/5ML IV SOSY
PREFILLED_SYRINGE | INTRAVENOUS | Status: DC | PRN
  Administered 2017-03-15: 5 mg via INTRAVENOUS
  Administered 2017-03-15: 40 mg via INTRAVENOUS
  Administered 2017-03-15: 10 mg via INTRAVENOUS
  Administered 2017-03-15: 5 mg via INTRAVENOUS

## 2017-03-15 MED ORDER — BUPIVACAINE-EPINEPHRINE (PF) 0.5% -1:200000 IJ SOLN
INTRAMUSCULAR | Status: AC
  Filled 2017-03-15: qty 30

## 2017-03-15 MED ORDER — ARTIFICIAL TEARS OPHTHALMIC OINT
TOPICAL_OINTMENT | Freq: Every day | OPHTHALMIC | Status: DC | PRN
  Filled 2017-03-15: qty 3.5

## 2017-03-15 MED ORDER — ACETAMINOPHEN 650 MG RE SUPP: 650.0000 mg | RECTAL | Status: DC | PRN

## 2017-03-15 MED ORDER — MIDAZOLAM HCL 2 MG/2ML IJ SOLN
INTRAMUSCULAR | Status: AC
  Filled 2017-03-15: qty 2

## 2017-03-15 MED ORDER — FENTANYL CITRATE (PF) 250 MCG/5ML IJ SOLN
INTRAMUSCULAR | Status: AC
  Filled 2017-03-15: qty 5

## 2017-03-15 MED ORDER — INSULIN ASPART 100 UNIT/ML ~~LOC~~ SOLN
0.0000 [IU] | Freq: Three times a day (TID) | SUBCUTANEOUS | Status: DC
  Administered 2017-03-15 – 2017-03-16 (×2): 3 [IU] via SUBCUTANEOUS
  Administered 2017-03-16: 2 [IU] via SUBCUTANEOUS

## 2017-03-15 MED ORDER — LACTATED RINGERS IV SOLN
INTRAVENOUS | Status: DC
  Administered 2017-03-15 (×2): via INTRAVENOUS

## 2017-03-15 MED ORDER — PROPOFOL 10 MG/ML IV BOLUS
INTRAVENOUS | Status: AC
  Filled 2017-03-15: qty 20

## 2017-03-15 MED ORDER — FENTANYL CITRATE (PF) 100 MCG/2ML IJ SOLN
INTRAMUSCULAR | Status: DC | PRN
  Administered 2017-03-15 (×3): 50 ug via INTRAVENOUS

## 2017-03-15 MED ORDER — ALPRAZOLAM 0.5 MG PO TABS: 0.5000 mg | ORAL_TABLET | Freq: Every evening | ORAL | Status: DC | PRN

## 2017-03-15 MED ORDER — POLYETHYLENE GLYCOL 3350 17 G PO PACK: 17.0000 g | PACK | Freq: Every day | ORAL | 0 refills | Status: DC

## 2017-03-15 MED ORDER — SUGAMMADEX SODIUM 200 MG/2ML IV SOLN
INTRAVENOUS | Status: DC | PRN
  Administered 2017-03-15: 200 mg via INTRAVENOUS

## 2017-03-15 MED ORDER — METOPROLOL TARTRATE 25 MG PO TABS
25.0000 mg | ORAL_TABLET | Freq: Two times a day (BID) | ORAL | Status: DC
  Administered 2017-03-15 – 2017-03-16 (×2): 25 mg via ORAL
  Filled 2017-03-15 (×2): qty 1

## 2017-03-15 MED ORDER — PHENOL 1.4 % MT LIQD
1.0000 | OROMUCOSAL | Status: DC | PRN
  Filled 2017-03-15: qty 177

## 2017-03-15 MED ORDER — HYDROCODONE-ACETAMINOPHEN 5-325 MG PO TABS: 1.0000 | ORAL_TABLET | ORAL | 0 refills | Status: DC | PRN

## 2017-03-15 MED ORDER — PANTOPRAZOLE SODIUM 40 MG PO TBEC
40.0000 mg | DELAYED_RELEASE_TABLET | Freq: Every day | ORAL | Status: DC
  Administered 2017-03-16: 40 mg via ORAL
  Filled 2017-03-15: qty 1

## 2017-03-15 MED ORDER — ONDANSETRON HCL 4 MG/2ML IJ SOLN
INTRAMUSCULAR | Status: DC | PRN
  Administered 2017-03-15: 4 mg via INTRAVENOUS

## 2017-03-15 MED ORDER — HYDROCODONE-ACETAMINOPHEN 5-325 MG PO TABS
1.0000 | ORAL_TABLET | ORAL | Status: DC | PRN
  Administered 2017-03-15: 1 via ORAL
  Filled 2017-03-15: qty 1

## 2017-03-15 MED ORDER — POTASSIUM CHLORIDE IN NACL 20-0.9 MEQ/L-% IV SOLN
INTRAVENOUS | Status: DC
  Administered 2017-03-15: 14:00:00 via INTRAVENOUS
  Filled 2017-03-15 (×2): qty 1000

## 2017-03-15 MED ORDER — EPHEDRINE SULFATE 50 MG/ML IJ SOLN
INTRAMUSCULAR | Status: DC | PRN
  Administered 2017-03-15: 5 mg via INTRAVENOUS
  Administered 2017-03-15: 10 mg via INTRAVENOUS

## 2017-03-15 MED ORDER — PROPOFOL 10 MG/ML IV BOLUS
INTRAVENOUS | Status: DC | PRN
  Administered 2017-03-15: 160 mg via INTRAVENOUS

## 2017-03-15 MED ORDER — BISACODYL 5 MG PO TBEC: 5.0000 mg | DELAYED_RELEASE_TABLET | Freq: Every day | ORAL | Status: DC | PRN

## 2017-03-15 MED ORDER — ONDANSETRON HCL 4 MG/2ML IJ SOLN
INTRAMUSCULAR | Status: AC
  Filled 2017-03-15: qty 2

## 2017-03-15 MED ORDER — CEFAZOLIN SODIUM-DEXTROSE 2-4 GM/100ML-% IV SOLN
INTRAVENOUS | Status: AC
  Filled 2017-03-15: qty 100

## 2017-03-15 MED ORDER — LISINOPRIL 10 MG PO TABS
10.0000 mg | ORAL_TABLET | Freq: Every morning | ORAL | Status: DC
  Administered 2017-03-16: 10 mg via ORAL
  Filled 2017-03-15: qty 1

## 2017-03-15 MED ORDER — ACETAMINOPHEN 10 MG/ML IV SOLN
INTRAVENOUS | Status: AC
  Filled 2017-03-15: qty 100

## 2017-03-15 MED ORDER — MENTHOL 3 MG MT LOZG: 1.0000 | LOZENGE | OROMUCOSAL | Status: DC | PRN

## 2017-03-15 MED ORDER — DEXAMETHASONE SODIUM PHOSPHATE 10 MG/ML IJ SOLN
INTRAMUSCULAR | Status: AC
  Filled 2017-03-15: qty 1

## 2017-03-15 MED ORDER — CEFAZOLIN SODIUM-DEXTROSE 2-4 GM/100ML-% IV SOLN
2.0000 g | INTRAVENOUS | Status: AC
  Administered 2017-03-15: 2 g via INTRAVENOUS

## 2017-03-15 MED ORDER — ONDANSETRON HCL 4 MG/2ML IJ SOLN: 4.0000 mg | Freq: Once | INTRAMUSCULAR | Status: DC | PRN

## 2017-03-15 MED ORDER — SPIRONOLACTONE 12.5 MG HALF TABLET
12.5000 mg | ORAL_TABLET | Freq: Every day | ORAL | Status: DC
  Administered 2017-03-16: 12.5 mg via ORAL
  Filled 2017-03-15: qty 1

## 2017-03-15 MED ORDER — RISAQUAD PO CAPS
1.0000 | ORAL_CAPSULE | Freq: Every day | ORAL | Status: DC
  Administered 2017-03-16: 1 via ORAL
  Filled 2017-03-15: qty 1

## 2017-03-15 MED ORDER — LEVOTHYROXINE SODIUM 88 MCG PO TABS
88.0000 ug | ORAL_TABLET | Freq: Every evening | ORAL | Status: DC
  Administered 2017-03-15: 88 ug via ORAL
  Filled 2017-03-15: qty 1

## 2017-03-15 MED ORDER — SODIUM CHLORIDE 0.9 % IV SOLN
INTRAVENOUS | Status: AC
  Filled 2017-03-15: qty 500000

## 2017-03-15 MED ORDER — POLYETHYLENE GLYCOL 3350 17 G PO PACK
17.0000 g | PACK | Freq: Every day | ORAL | Status: DC
  Administered 2017-03-16: 17 g via ORAL
  Filled 2017-03-15: qty 1

## 2017-03-15 MED ORDER — CEFAZOLIN SODIUM-DEXTROSE 2-4 GM/100ML-% IV SOLN
2.0000 g | Freq: Three times a day (TID) | INTRAVENOUS | Status: AC
  Administered 2017-03-15 – 2017-03-16 (×2): 2 g via INTRAVENOUS
  Filled 2017-03-15 (×2): qty 100

## 2017-03-15 MED ORDER — HYDROCODONE-ACETAMINOPHEN 7.5-325 MG PO TABS
1.0000 | ORAL_TABLET | Freq: Four times a day (QID) | ORAL | Status: DC
  Administered 2017-03-15 – 2017-03-16 (×4): 1 via ORAL
  Filled 2017-03-15 (×3): qty 1

## 2017-03-15 MED ORDER — MIDAZOLAM HCL 5 MG/5ML IJ SOLN
INTRAMUSCULAR | Status: DC | PRN
  Administered 2017-03-15 (×2): 1 mg via INTRAVENOUS

## 2017-03-15 SURGICAL SUPPLY — 50 items
AGENT HMST SPONGE THK3/8 (HEMOSTASIS)
BAG SPEC THK2 15X12 ZIP CLS (MISCELLANEOUS)
BAG ZIPLOCK 12X15 (MISCELLANEOUS) IMPLANT
CLEANER TIP ELECTROSURG 2X2 (MISCELLANEOUS) ×2 IMPLANT
CLOTH 2% CHLOROHEXIDINE 3PK (PERSONAL CARE ITEMS) ×2 IMPLANT
COVER SURGICAL LIGHT HANDLE (MISCELLANEOUS) ×2 IMPLANT
DRAPE MICROSCOPE LEICA (MISCELLANEOUS) ×2 IMPLANT
DRAPE SHEET LG 3/4 BI-LAMINATE (DRAPES) ×2 IMPLANT
DRAPE SURG 17X11 SM STRL (DRAPES) ×2 IMPLANT
DRAPE UTILITY XL STRL (DRAPES) ×2 IMPLANT
DRSG AQUACEL AG ADV 3.5X 4 (GAUZE/BANDAGES/DRESSINGS) IMPLANT
DRSG AQUACEL AG ADV 3.5X 6 (GAUZE/BANDAGES/DRESSINGS) ×2 IMPLANT
DURAPREP 26ML APPLICATOR (WOUND CARE) ×2 IMPLANT
DURASEAL SPINE SEALANT 3ML (MISCELLANEOUS) IMPLANT
ELECT BLADE TIP CTD 4 INCH (ELECTRODE) IMPLANT
ELECT REM PT RETURN 15FT ADLT (MISCELLANEOUS) ×2 IMPLANT
GLOVE BIOGEL PI IND STRL 7.0 (GLOVE) ×1 IMPLANT
GLOVE BIOGEL PI INDICATOR 7.0 (GLOVE) ×1
GLOVE SURG SS PI 7.0 STRL IVOR (GLOVE) ×2 IMPLANT
GLOVE SURG SS PI 8.0 STRL IVOR (GLOVE) ×4 IMPLANT
GOWN STRL REUS W/TWL XL LVL3 (GOWN DISPOSABLE) ×4 IMPLANT
HEMOSTAT SPONGE AVITENE ULTRA (HEMOSTASIS) IMPLANT
IV CATH 14GX2 1/4 (CATHETERS) ×2 IMPLANT
KIT BASIN OR (CUSTOM PROCEDURE TRAY) ×2 IMPLANT
KIT POSITIONING SURG ANDREWS (MISCELLANEOUS) ×2 IMPLANT
MANIFOLD NEPTUNE II (INSTRUMENTS) ×2 IMPLANT
MARKER SKIN DUAL TIP RULER LAB (MISCELLANEOUS) ×2 IMPLANT
NEEDLE SPNL 18GX3.5 QUINCKE PK (NEEDLE) ×4 IMPLANT
PACK LAMINECTOMY ORTHO (CUSTOM PROCEDURE TRAY) ×2 IMPLANT
PATTIES SURGICAL .5 X.5 (GAUZE/BANDAGES/DRESSINGS) ×2 IMPLANT
PATTIES SURGICAL .75X.75 (GAUZE/BANDAGES/DRESSINGS) ×2 IMPLANT
PATTIES SURGICAL 1X1 (DISPOSABLE) IMPLANT
RUBBERBAND STERILE (MISCELLANEOUS) ×2 IMPLANT
SPONGE LAP 4X18 X RAY DECT (DISPOSABLE) IMPLANT
SPONGE SURGIFOAM ABS GEL 100 (HEMOSTASIS) ×2 IMPLANT
STAPLER VISISTAT (STAPLE) IMPLANT
STRIP CLOSURE SKIN 1/2X4 (GAUZE/BANDAGES/DRESSINGS) IMPLANT
SUT NURALON 4 0 TR CR/8 (SUTURE) IMPLANT
SUT PROLENE 3 0 PS 2 (SUTURE) ×2 IMPLANT
SUT VIC AB 1 CT1 27 (SUTURE)
SUT VIC AB 1 CT1 27XBRD ANTBC (SUTURE) IMPLANT
SUT VIC AB 1-0 CT2 27 (SUTURE) ×4 IMPLANT
SUT VIC AB 2-0 CT1 27 (SUTURE) ×2
SUT VIC AB 2-0 CT1 TAPERPNT 27 (SUTURE) ×1 IMPLANT
SUT VIC AB 2-0 CT2 27 (SUTURE) IMPLANT
SYR 3ML LL SCALE MARK (SYRINGE) IMPLANT
TOWEL OR 17X26 10 PK STRL BLUE (TOWEL DISPOSABLE) ×2 IMPLANT
TOWEL OR NON WOVEN STRL DISP B (DISPOSABLE) ×2 IMPLANT
TRAY FOLEY CATH SILVER 14FR (SET/KITS/TRAYS/PACK) ×2 IMPLANT
YANKAUER SUCT BULB TIP NO VENT (SUCTIONS) ×2 IMPLANT

## 2017-03-15 NOTE — Brief Op Note (Signed)
03/15/2017  10:47 AM  PATIENT:  [name]  66 y.o. female  PRE-OPERATIVE DIAGNOSIS:  Spinal Stenosis  POST-OPERATIVE DIAGNOSIS:  Spinal Stenosis  PROCEDURE:  Procedure(s) with comments: Microlumbar decompression L4-5, L5-S1 (N/A) - 150 mins  SURGEON:  Surgeon(s) and Role:    Jene Every* Naara Kelty, MD - Primary  PHYSICIAN ASSISTANT:   ASSISTANTS: Bissell   ANESTHESIA:   general  EBL:  50 mL   BLOOD ADMINISTERED:none  DRAINS: none   LOCAL MEDICATIONS USED:  MARCAINE     SPECIMEN:  No Specimen  DISPOSITION OF SPECIMEN:  N/A  COUNTS:  YES  TOURNIQUET:  * No tourniquets in log *  DICTATION: .Other Dictation: Dictation Number [id number]  PLAN OF CARE: Admit for overnight observation  PATIENT DISPOSITION:  PACU - hemodynamically stable.   Delay start of Pharmacological VTE agent (>24hrs) due to surgical blood loss or risk of bleeding: yes

## 2017-03-15 NOTE — Interval H&P Note (Signed)
History and Physical Interval Note:  03/15/2017 8:35 AM  Amanda BridegroomKathy B Singh  has presented today for surgery, with the diagnosis of Spinal Stenosis  The various methods of treatment have been discussed with the patient and family. After consideration of risks, benefits and other options for treatment, the patient has consented to  Procedure(s) with comments: Microlumbar decompression L4-5, L5-S1 (N/A) - 150 mins as a surgical intervention .  The patient's history has been reviewed, patient examined, no change in status, stable for surgery.  I have reviewed the patient's chart and labs.  Questions were answered to the patient's satisfaction.     Aleah Ahlgrim C

## 2017-03-15 NOTE — Op Note (Signed)
NAME:  [name], [name]                       ACCOUNT NO.:  MEDICAL RECORD NO.:  [id number]  LOCATION:                                 FACILITY:  PHYSICIAN:  Jene EveryJeffrey Oakley Kossman, M.D.         DATE OF BIRTH:  DATE OF PROCEDURE:  03/15/2017 DATE OF DISCHARGE:                              OPERATIVE REPORT   PREOPERATIVE DIAGNOSIS:  Spinal stenosis, L4-L5, L5-S1.  POSTOPERATIVE DIAGNOSIS:  Spinal stenosis, L4-L5, L5-S1.  PROCEDURE PERFORMED: 1. Microlumbar decompression, L4-L5, L5-S1. 2. Bilateral foraminotomies, L4, L5, and S1. 3. Laminectomies of L5 bilaterally.  ANESTHESIA:  General.  ASSISTANT:  Lanna PocheJacqueline Bissell, PA.  HISTORY:  Amanda is a pleasant 66 year old Singh with neurogenic claudication secondary to spinal stenosis bilaterally, right greater than left; EHL weakness, numbness in the L5-S1 nerve root distributions. She had severe lateral recess stenosis, L4-L5 and L5-S1 indicating for microlumbar decompression at both levels.  Risks and benefits were discussed including bleeding, infection, damage to neurovascular structures, no change in symptoms, worsening symptoms, DVT, PE, anesthetic complications, need for fusion in the future, etc.  TECHNIQUE:  With the patient in supine position after induction of adequate general anesthesia, 2 g of Kefzol, placed prone on the [place] frame.  Foley to gravity.  Lumbar region was prepped and draped in usual sterile fashion.  All bony prominences were well padded.  Two 18-gauge spinal needles were utilized to localize L4-L5 and L5-S1 interspace confirmed with x-ray.  Incision was made from the spinous process of L4- S1, subcutaneous tissue dissected, electrocautery was utilized to achieve hemostasis.  Marcaine 0.25% with epinephrine was infiltrated in the perimuscular tissue.  Divided the dorsolumbar fascia.  Paraspinous muscle was elevated from lamina of L4-L5 and L5-S1.  McCulloch retractor was placed.  Operating microscope was draped  and brought on the surgical field.  Confirmatory radiograph obtained.  Small interlaminar windows were noted particularly at L4-L5 due to shingling of the lamina of L5. I removed the spinous process of L5, partial of L4 and of the S1. Proceeded to perform hemilaminotomies of the caudad edge of L5 bilaterally.  Straight curette was utilized to detach the ligamentum flavum from the cephalad edge of S1 and the caudad edge of L5.  With a 2 mm Kerrison, we removed the lamina bilaterally.  Severe lateral recess stenosis was noted at L5-S1.  Decompressed the lateral recess to the medial border of pedicle at L5-S1.  Hypertrophic ligamentum and facet arthropathy were noted.  Foraminotomies of L5 and S1 were performed from both sides of the operating room table with the neural elements well protected.  At L4-L5, there was severe multifactorial stenosis noted as well.  Hemilaminotomy of the caudad edge of L4 was performed bilaterally with a 2 and a 3 mm Kerrison to the pedicle of L4.  Foraminotomies were performed of L4 as well.  Decompressed lateral recess to the medial border of the pedicle.  Again, severe compression of the L5 nerve roots were noted there.  These were protected at all times.  No disk herniation was noted at L4-L5 and L5-S1.  Neural probe passed freely at the foramen of L4,  L5, and S1 bilaterally.  After the decompression, a confirmatory radiograph was obtained demonstrating the pedicle of the foramen of L4 and of the S1.  Good restoration of thecal sac.  No active bleeding or CSF leakage.  Amanda was consistent with [name] clinical symptomatology.  Copiously irrigated with antibiotic irrigation. Thrombin-soaked Gelfoam was placed in laminotomy defect.  McCulloch retractor was removed.  Repaired the dorsolumbar fascia with 1 Vicryl, subcu with 2-0, and skin with Prolene.  Sterile dressing applied. Placed supine on the hospital bed, extubated without difficulty and transported to the  recovery room in satisfactory condition.  The patient tolerated the procedure well.  No complications.  Blood loss 50 mL.     Jene EveryJeffrey Evamae Rowen, M.D.     Cordelia PenJB/MEDQ  D:  03/15/2017  T:  03/15/2017  Job:  [id number]

## 2017-03-15 NOTE — Discharge Instructions (Signed)

## 2017-03-15 NOTE — Anesthesia Postprocedure Evaluation (Signed)
Anesthesia Post Note  Patient: [name]  Procedure(s) Performed: Microlumbar decompression L4-5, L5-S1 (N/A )     Patient location during evaluation: PACU Anesthesia Type: General Level of consciousness: awake and alert Pain management: pain level controlled Vital Signs Assessment: post-procedure vital signs reviewed and stable Respiratory status: spontaneous breathing, nonlabored ventilation and respiratory function stable Cardiovascular status: blood pressure returned to baseline and stable Postop Assessment: no apparent nausea or vomiting Anesthetic complications: no    Last Vitals:  Vitals:   03/15/17 1200 03/15/17 1215  BP: 112/78 118/80  Pulse: 87 91  Resp: 19 18  Temp: 36.8 C 36.9 C  SpO2: 97% 95%    Last Pain:  Vitals:   03/15/17 1233  TempSrc:   PainSc: 0-No pain    LLE Motor Response: Purposeful movement (03/15/17 1220) LLE Sensation: Full sensation (03/15/17 1220)   RLE Sensation: Numbness(Dr Beane aware) (03/15/17 1220)      Beryle Lathehomas E Brock

## 2017-03-15 NOTE — Anesthesia Procedure Notes (Addendum)
Procedure Name: Intubation Date/Time: 03/15/2017 8:53 AM Performed by: West Pugh, CRNA Pre-anesthesia Checklist: Patient identified, Emergency Drugs available, Suction available, Patient being monitored and Timeout performed Patient Re-evaluated:Patient Re-evaluated prior to induction Oxygen Delivery Method: Circle system utilized Preoxygenation: Pre-oxygenation with 100% oxygen Induction Type: IV induction Ventilation: Oral airway inserted - appropriate to patient size and Two handed mask ventilation required Laryngoscope Size: Mac and 3 Grade View: Grade I Tube type: Oral Tube size: 7.5 mm Number of attempts: 1 Airway Equipment and Method: Stylet Placement Confirmation: ETT inserted through vocal cords under direct vision,  positive ETCO2,  CO2 detector and breath sounds checked- equal and bilateral Secured at: 22 cm Tube secured with: Tape Dental Injury: Teeth and Oropharynx as per pre-operative assessment

## 2017-03-15 NOTE — Evaluation (Signed)
Physical Therapy Evaluation Patient Details Name: [name] MRN: [id number] DOB: 04-May-1950 Today's Date: 03/15/2017   History of Present Illness  [age] y.o. female admitted for L4-L5/L5-S1 decompression 03/15/17;  PMH of DM  Clinical Impression  Patient is s/p above surgery resulting in the deficits listed below (see PT Problem List). Pt reports numbness in R foot. She has good strength in R ankle/knee/hip, weak R EHL (-4/5). Proprioception intact at R ankle, absent at R great toe. RLE buckled in standing so gait deferred. Min A for pivot to recliner with RW.  Patient will benefit from skilled PT to increase their independence and safety with mobility (while adhering to their precautions) to allow discharge to the venue listed below.     Follow Up Recommendations DC plan and follow up therapy as arranged by surgeon    Equipment Recommendations  Rolling walker with 5" wheels    Recommendations for Other Services       Precautions / Restrictions Precautions Precautions: Fall;Back Precaution Booklet Issued: Yes (comment) Precaution Comments: RLE buckled in standing  Restrictions Weight Bearing Restrictions: No      Mobility  Bed Mobility Overal bed mobility: Needs Assistance Bed Mobility: Rolling;Sidelying to Sit Rolling: Supervision Sidelying to sit: Supervision       General bed mobility comments: VCs for log rolling technique  Transfers Overall transfer level: Needs assistance Equipment used: Rolling walker (2 wheeled) Transfers: Sit to/from [employer] CorporationStand;Stand Pivot Transfers Sit to Stand: Min assist;From elevated surface         General transfer comment: VCs hand placement, min A to rise, RLE buckled right after standing so returned to sitting position; sit to stand second attempt pt used UEs on RW more and did not buckle on R, was able to pivot to recliner  Ambulation/Gait             General Gait Details: deferred 2* RLE buckling in standing  Stairs             Wheelchair Mobility    Modified Rankin (Stroke Patients Only)       Balance Overall balance assessment: Needs assistance   Sitting balance-Leahy Scale: Good     Standing balance support: Bilateral upper extremity supported Standing balance-Leahy Scale: Poor Standing balance comment: relies on BUE support 2* RLE weakness                             Pertinent Vitals/Pain Pain Assessment: 0-10 Pain Score: 3  Pain Location: incision Pain Descriptors / Indicators: Sore Pain Intervention(s): Limited activity within patient's tolerance;Monitored during session;Premedicated before session    Home Living Family/patient expects to be discharged to:: Private residence Living Arrangements: Spouse/significant other Available Help at Discharge: Family;Available 24 hours/day Type of Home: House Home Access: Stairs to enter   Enter[employer] CorporationEntrance Stairs-Number of Steps: 2 Home Layout: Two level;Able to live on main level with bedroom/bathroom Home Equipment: Shower seat - built in      Prior Function Level of Independence: Independent         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 [employer]     Hand Dominance        Extremity/Trunk Assessment   Upper Extremity Assessment Upper Extremity Assessment: Overall WFL for tasks assessed    Lower Extremity Assessment Lower Extremity Assessment: RLE deficits/detail RLE Deficits / Details: R foot numb to light touch, absent proprioception R great toe, proprioception intact R ankle, ankle PF/DF +4/5, knee ext  5/5, RLE buckled in standing       Communication   Communication: No difficulties  Cognition Arousal/Alertness: Awake/alert Behavior During Therapy: WFL for tasks assessed/performed Overall Cognitive Status: Within Functional Limits for tasks assessed                                        General Comments      Exercises General Exercises - Lower Extremity Ankle Circles/Pumps:  AROM;Both;10 reps;Seated Long Arc Quad: AROM;Right;15 reps;Seated   Assessment/Plan    PT Assessment Patient needs continued PT services  PT Problem List Decreased strength;Decreased activity tolerance;Decreased balance;Decreased mobility;Impaired sensation;Decreased knowledge of precautions;Decreased knowledge of use of DME;Pain       PT Treatment Interventions DME instruction;Gait training;Stair training;Therapeutic exercise;Therapeutic activities;Functional mobility training;Patient/family education    PT Goals (Current goals can be found in the Care Plan section)  Acute Rehab PT Goals Patient Stated Goal: return to work as nurse PT Goal Formulation: With patient/family Time For Goal Achievement: 03/22/17 Potential to Achieve Goals: Good    Frequency Min 6X/week   Barriers to discharge        Co-evaluation               AM-PAC PT "6 Clicks" Daily Activity  Outcome Measure Difficulty turning over in bed (including adjusting bedclothes, sheets and blankets)?: A Little Difficulty moving from lying on back to sitting on the side of the bed? : A Little Difficulty sitting down on and standing up from a chair with arms (e.g., wheelchair, bedside commode, etc,.)?: Unable Help needed moving to and from a bed to chair (including a wheelchair)?: A Little Help needed walking in hospital room?: Total Help needed climbing 3-5 steps with a railing? : Total 6 Click Score: 12    End of Session Equipment Utilized During Treatment: Gait belt Activity Tolerance: Patient tolerated treatment well Patient left: in chair;with call bell/phone within reach;with family/visitor present Nurse Communication: Mobility status PT Visit Diagnosis: Unsteadiness on feet (R26.81);Muscle weakness (generalized) (M62.81);Difficulty in walking, not elsewhere classified (R26.2);Other symptoms and signs involving the nervous system (R29.898);Pain    Time: 1325-1350 PT Time Calculation (min) (ACUTE ONLY):  25 min   Charges:   PT Evaluation $PT Eval Low Complexity: 1 Low PT Treatments $Therapeutic Activity: 8-22 mins   PT G Codes:   PT G-Codes **NOT FOR INPATIENT CLASS** Functional Assessment Tool Used: AM-PAC 6 Clicks Basic Mobility Functional Limitation: Mobility: Walking and moving around Mobility: Walking and Moving Around Current Status ([id number]([id number]): At least 60 percent but less than 80 percent impaired, limited or restricted Mobility: Walking and Moving Around Goal Status [phone](G8979): At least 20 percent but less than 40 percent impaired, limited or restricted      Tamala SerUhlenberg, Chayden Garrelts Kistler 03/15/2017, 2:01 PM [phone]

## 2017-03-15 NOTE — Transfer of Care (Signed)
Immediate Anesthesia Transfer of Care Note  Patient: [name]  Procedure(s) Performed: Microlumbar decompression L4-5, L5-S1 (N/A )  Patient Location: PACU  Anesthesia Type:General  Level of Consciousness: alert , sedated and patient cooperative  Airway & Oxygen Therapy: Patient Spontanous Breathing and Patient connected to face mask oxygen  Post-op Assessment: Report given to RN, Post -op Vital signs reviewed and stable and Patient moving all extremities X 4  Post vital signs: Reviewed and stable  Last Vitals:  Vitals:   03/15/17 0630  BP: (!) 135/95  Pulse: 80  Resp: 16  Temp: 37.4 C  SpO2: 98%    Last Pain:  Vitals:   03/15/17 0643  TempSrc:   PainSc: 5       Patients Stated Pain Goal: 4 (03/15/17 16100643)  Complications: No apparent anesthesia complications

## 2017-03-15 NOTE — Interval H&P Note (Signed)
History and Physical Interval Note:  03/15/2017 8:36 AM  Amanda BridegroomKathy B Jowers  has presented today for surgery, with the diagnosis of Spinal Stenosis  The various methods of treatment have been discussed with the patient and family. After consideration of risks, benefits and other options for treatment, the patient has consented to  Procedure(s) with comments: Microlumbar decompression L4-5, L5-S1 (N/A) - 150 mins as a surgical intervention .  The patient's history has been reviewed, patient examined, no change in status, stable for surgery.  I have reviewed the patient's chart and labs.  Questions were answered to the patient's satisfaction.     Tinisha Etzkorn C

## 2017-03-16 ENCOUNTER — Encounter (HOSPITAL_COMMUNITY): Payer: Self-pay | Admitting: Specialist

## 2017-03-16 DIAGNOSIS — M8580 Other specified disorders of bone density and structure, unspecified site: Secondary | ICD-10-CM | POA: Diagnosis not present

## 2017-03-16 DIAGNOSIS — Z885 Allergy status to narcotic agent status: Secondary | ICD-10-CM | POA: Diagnosis not present

## 2017-03-16 DIAGNOSIS — E785 Hyperlipidemia, unspecified: Secondary | ICD-10-CM | POA: Diagnosis not present

## 2017-03-16 DIAGNOSIS — F419 Anxiety disorder, unspecified: Secondary | ICD-10-CM | POA: Diagnosis not present

## 2017-03-16 DIAGNOSIS — E119 Type 2 diabetes mellitus without complications: Secondary | ICD-10-CM | POA: Diagnosis not present

## 2017-03-16 DIAGNOSIS — E039 Hypothyroidism, unspecified: Secondary | ICD-10-CM | POA: Diagnosis not present

## 2017-03-16 DIAGNOSIS — Z6830 Body mass index (BMI) 30.0-30.9, adult: Secondary | ICD-10-CM | POA: Diagnosis not present

## 2017-03-16 DIAGNOSIS — Z79890 Hormone replacement therapy (postmenopausal): Secondary | ICD-10-CM | POA: Diagnosis not present

## 2017-03-16 DIAGNOSIS — M48061 Spinal stenosis, lumbar region without neurogenic claudication: Secondary | ICD-10-CM | POA: Diagnosis not present

## 2017-03-16 DIAGNOSIS — I10 Essential (primary) hypertension: Secondary | ICD-10-CM | POA: Diagnosis not present

## 2017-03-16 DIAGNOSIS — M4807 Spinal stenosis, lumbosacral region: Secondary | ICD-10-CM | POA: Diagnosis not present

## 2017-03-16 DIAGNOSIS — E669 Obesity, unspecified: Secondary | ICD-10-CM | POA: Diagnosis not present

## 2017-03-16 DIAGNOSIS — M5416 Radiculopathy, lumbar region: Secondary | ICD-10-CM | POA: Diagnosis not present

## 2017-03-16 DIAGNOSIS — Z7984 Long term (current) use of oral hypoglycemic drugs: Secondary | ICD-10-CM | POA: Diagnosis not present

## 2017-03-16 DIAGNOSIS — Z79899 Other long term (current) drug therapy: Secondary | ICD-10-CM | POA: Diagnosis not present

## 2017-03-16 DIAGNOSIS — K219 Gastro-esophageal reflux disease without esophagitis: Secondary | ICD-10-CM | POA: Diagnosis not present

## 2017-03-16 LAB — BASIC METABOLIC PANEL
Anion gap: 5 (ref 5–15)
BUN: 11 mg/dL (ref 6–20)
CHLORIDE: 104 mmol/L (ref 101–111)
CO2: 28 mmol/L (ref 22–32)
CREATININE: 0.77 mg/dL (ref 0.44–1.00)
Calcium: 8.9 mg/dL (ref 8.9–10.3)
GFR calc Af Amer: >60 mL/min (ref >60)
GFR calc non Af Amer: >60 mL/min (ref >60)
Glucose, Bld: 124 mg/dL — ABNORMAL HIGH (ref 65–99)
POTASSIUM: 4.2 mmol/L (ref 3.5–5.1)
Sodium: 137 mmol/L (ref 135–145)

## 2017-03-16 LAB — GLUCOSE, CAPILLARY
Glucose-Capillary: 121 mg/dL — ABNORMAL HIGH (ref 65–99)
Glucose-Capillary: 160 mg/dL — ABNORMAL HIGH (ref 65–99)

## 2017-03-16 MED ORDER — NAPROXEN 500 MG PO TABS: 500.0000 mg | ORAL_TABLET | Freq: Two times a day (BID) | ORAL | 1 refills | Status: DC

## 2017-03-16 MED ORDER — DEXAMETHASONE SODIUM PHOSPHATE 10 MG/ML IJ SOLN
10.0000 mg | Freq: Once | INTRAMUSCULAR | Status: AC
  Administered 2017-03-16: 10 mg via INTRAVENOUS
  Filled 2017-03-16: qty 1

## 2017-03-16 MED ORDER — IBUPROFEN 200 MG PO TABS: 200.0000 mg | ORAL_TABLET | Freq: Three times a day (TID) | ORAL | 0 refills | Status: DC | PRN

## 2017-03-16 MED ORDER — GABAPENTIN 300 MG PO CAPS: 300.0000 mg | ORAL_CAPSULE | Freq: Three times a day (TID) | ORAL | Status: DC | PRN

## 2017-03-16 NOTE — Progress Notes (Signed)
Physical Therapy Treatment Patient Details Name: [name] MRN: [id number] DOB: 31-Dec-1950 Today's Date: 03/16/2017    History of Present Illness 66 y.o. female admitted for L4-L5/L5-S1 decompression 03/15/17;  PMH of DM    PT Comments    Ambulated 100 feet with RW, VC's required to widen BOS. Displayed a decreased stride length and a slight foot drop on the RLE. When performing a 1 step curve, patient's RLE buckled when weight shifting to ascend with the L. Instructed patient to ascend the curve backwards with RW for safety, and descend going forwards. Educated patient on back precautions and safety.    Follow Up Recommendations  DC plan and follow up therapy as arranged by surgeon     Equipment Recommendations  Rolling walker with 5" wheels    Recommendations for Other Services       Precautions / Restrictions Precautions Precautions: Fall;Back Precaution Booklet Issued: Yes (comment) Precaution Comments: RLE buckled in standing, while attempting a 1 step Restrictions Weight Bearing Restrictions: No    Mobility  Bed Mobility    Transfers Overall transfer level: Needs assistance Equipment used: Rolling walker (2 wheeled) Transfers: Sit to/from Stand Sit to Stand: Min guard;Supervision Stand pivot transfers: Min guard       General transfer comment: 25% VC's for hand placement. no RLE buckling during transfer  Ambulation/Gait Ambulation/Gait assistance: Min guard;Supervision Ambulation Distance (Feet): 100 Feet Assistive device: Rolling walker (2 wheeled) Gait Pattern/deviations: Step-through pattern;Decreased stride length;Decreased dorsiflexion - right;Narrow base of support   Gait velocity interpretation: Below normal speed for age/gender General Gait Details: 25% VC's to widen BOS. Slight foot drop with RLE, RLE buckled when weight shifting to ascend 1 step.    Stairs            Wheelchair Mobility    Modified Rankin (Stroke Patients Only)        Balance                            Cognition Arousal/Alertness: Awake/alert Behavior During Therapy: WFL for tasks assessed/performed Overall Cognitive Status: Within Functional Limits for tasks assessed                                        Exercises      General Comments        Pertinent Vitals/Pain Pain Assessment: 0-10 Pain Score: 3  Pain Location: back Pain Descriptors / Indicators: Sore Pain Intervention(s): Limited activity within patient's tolerance;Repositioned    Home Living Family/patient expects to be discharged to:: Private residence Living Arrangements: Spouse/significant other Available Help at Discharge: Family;Available 24 hours/day Type of Home: House Home Access: Stairs to enter   Home Layout: Two level;Able to live on main level with bedroom/bathroom Home Equipment: Shower seat - built in      Prior Function Level of Independence: Independent      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 : A Little Difficulty sitting down on and standing up from a chair with arms (e.g., wheelchair, bedside commode, etc,.)?: A Little Help needed moving to and from a bed to chair (including a wheelchair)?: A Little Help needed walking in hospital room?: A Little Help needed climbing 3-5 steps with a railing? : A Lot 6 Click Score: 17    End of Session Equipment Utilized During Treatment: Gait belt Activity Tolerance: Patient tolerated treatment well Patient left: in  chair;with call bell/phone within reach Nurse Communication: Mobility status PT Visit Diagnosis: Unsteadiness on feet (R26.81);Muscle weakness (generalized) (M62.81);Difficulty in walking, not elsewhere classified (R26.2);Other symptoms and signs involving the nervous system (R29.898);Pain     Time: 1200-1228 PT Time Calculation (min) (ACUTE ONLY): 28 min  Charges:  $Gait Training: 8-22 mins $Therapeutic Activity: 8-22 mins                    G Codes:       [name], SPTA [facility] [phone]    Amanda ShellingLori Ziair Singh  PTA [facility]  Acute  Rehab Pager      [phone]

## 2017-03-16 NOTE — Progress Notes (Signed)
Subjective: 1 Day Post-Op Procedure(s) (LRB): Microlumbar decompression L4-5, L5-S1 (N/A) Patient reports pain as 4 on 0-10 scale.   Numbness right foot. Minimal leg pain. Objective: Vital signs in last 24 hours: Temp:  [97.8 F (36.6 Singh)-98.5 F (36.9 Singh)] 98 F (36.7 Singh) (11/15 0511) Pulse Rate:  [69-95] 70 (11/15 0511) Resp:  [15-21] 16 (11/15 0511) BP: (94-129)/(57-93) 108/71 (11/15 0511) SpO2:  [92 %-99 %] 96 % (11/15 0511)  Intake/Output from previous day: 11/14 0701 - 11/15 0700 In: 3380.8 [P.O.:1200; I.V.:1930.8; IV Piggyback:250] Out: 1275 [Urine:1225; Blood:50] Intake/Output this shift: No intake/output data recorded.  No results for input(s): HGB in the last 72 hours. No results for input(s): WBC, RBC, HCT, PLT in the last 72 hours. Recent Labs    03/15/17 1225 03/16/17 0511  NA  --  137  K  --  4.2  CL  --  104  CO2  --  28  BUN  --  11  CREATININE  --  0.77  GLUCOSE 179* 124*  CALCIUM  --  8.9   No results for input(s): LABPT, INR in the last 72 hours.  Sensation intact distally Intact pulses distally Dorsiflexion/Plantar flexion intact Incision: dressing Singh/D/I Compartment soft  Decreased sensation S1.  Assessment/Plan: 1 Day Post-Op Procedure(s) (LRB): Microlumbar decompression L4-5, L5-S1 (N/A) Advance diet Up with therapy Discharge home with home health  Improving S1 sensation secondary to nerve swelling. Motor 5/ Decadron. Should resolve. OR discussed.  [name] 03/16/2017, 7:20 AM

## 2017-03-16 NOTE — Progress Notes (Signed)
Patient discharged to home with family. Given all belongings, instructions, prescriptions, equipment. Husb present for teaching. Escorted to pov via w.c

## 2017-03-16 NOTE — Progress Notes (Signed)
Subjective: 1 Day Post-Op Procedure(s) (LRB): Microlumbar decompression L4-5, L5-S1 (N/A) Patient reports pain as moderate.  Reports numbness and slight tingling down the leg in the S1 distribution. Incisional pain. No other c/o. Did not do much PT yesterday. Seen by Dr. Shelle IronBeane this AM.  Objective: Vital signs in last 24 hours: Temp:  [97.8 F (36.6 C)-98.5 F (36.9 C)] 98 F (36.7 C) (11/15 0511) Pulse Rate:  [69-95] 70 (11/15 0511) Resp:  [15-21] 16 (11/15 0511) BP: (94-129)/(57-93) 108/71 (11/15 0511) SpO2:  [92 %-99 %] 96 % (11/15 0511)  Intake/Output from previous day: 11/14 0701 - 11/15 0700 In: 3380.8 [P.O.:1200; I.V.:1930.8; IV Piggyback:250] Out: 1275 [Urine:1225; Blood:50] Intake/Output this shift: No intake/output data recorded.  No results for input(s): HGB in the last 72 hours. No results for input(s): WBC, RBC, HCT, PLT in the last 72 hours. Recent Labs    03/15/17 1225 03/16/17 0511  NA  --  137  K  --  4.2  CL  --  104  CO2  --  28  BUN  --  11  CREATININE  --  0.77  GLUCOSE 179* 124*  CALCIUM  --  8.9   No results for input(s): LABPT, INR in the last 72 hours.  Neurologically intact ABD soft Neurovascular intact Sensation intact distally Intact pulses distally Dorsiflexion/Plantar flexion intact Incision: dressing C/D/I and no drainage No cellulitis present Compartment soft no calf pain or sign of DVT  Assessment/Plan: 1 Day Post-Op Procedure(s) (LRB): Microlumbar decompression L4-5, L5-S1 (N/A) Advance diet Up with therapy D/C IV fluids  Discussed Lspine precautions Decadron- numbness is likely due to nerve compression intra-op and should improve. May send home on dosepak. Will need to watch sugars Possible D/C later today depending on pain control and progress with PT  BISSELL, JACLYN M. 03/16/2017, 7:45 AM

## 2017-03-16 NOTE — Evaluation (Signed)
Occupational Therapy Evaluation Patient Details Name: Amanda BridegroomKathy B Pettigrew MRN: [id number] DOB: Jul 26, 1950 Today's Date: 03/16/2017    History of Present Illness 66 y.o. female admitted for L4-L5/L5-S1 decompression 03/15/17;  PMH of DM   Clinical Impression   OT education complete regarding ADL activity with back precautions. Pt did depend on walker as sensation decreased R foot area. Pt with good awareness.  Noted knee buckling yesterday- much better this day    Follow Up Recommendations  No OT follow up    Equipment Recommendations  None recommended by OT    Recommendations for Other Services       Precautions / Restrictions Precautions Precautions: Fall;Back Precaution Booklet Issued: Yes (comment) Precaution Comments: RLE buckled in standing  Restrictions Weight Bearing Restrictions: No      Mobility Bed Mobility Overal bed mobility: Needs Assistance Bed Mobility: Rolling;Sidelying to Sit Rolling: Supervision Sidelying to sit: Supervision       General bed mobility comments: VCs for log rolling technique  Transfers Overall transfer level: Needs assistance Equipment used: Rolling walker (2 wheeled) Transfers: Sit to/from BJ'sStand;Stand Pivot Transfers Sit to Stand: From elevated surface;Min guard Stand pivot transfers: Min guard            Balance Overall balance assessment: Needs assistance   Sitting balance-Leahy Scale: Good     Standing balance support: Bilateral upper extremity supported Standing balance-Leahy Scale: Poor Standing balance comment: relies on BUE support 2* RLE weakness                           ADL either performed or assessed with clinical judgement   ADL Overall ADL's : Needs assistance/impaired Eating/Feeding: Set up;Sitting   Grooming: Set up;Sitting   Upper Body Bathing: Set up;Sitting   Lower Body Bathing: Minimal assistance;Sit to/from stand;Cueing for safety;Cueing for sequencing;Cueing for back precautions    Upper Body Dressing : Set up;Sitting   Lower Body Dressing: Minimal assistance;Sit to/from stand;Cueing for safety;Cueing for sequencing;Cueing for back precautions   Toilet Transfer: RW;Comfort height toilet;BSC   Toileting- Clothing Manipulation and Hygiene: Min guard;Sit to/from stand;Cueing for sequencing;Cueing for safety     Tub/Shower Transfer Details (indicate cue type and reason): verbalized safety Functional mobility during ADLs: Min guard General ADL Comments: husband will A as needed     Vision Patient Visual Report: No change from baseline       Perception     Praxis      Pertinent Vitals/Pain Pain Score: 6  Pain Location: back Pain Descriptors / Indicators: Sore Pain Intervention(s): Limited activity within patient's tolerance;Repositioned        Extremity/Trunk Assessment     Lower Extremity Assessment RLE Deficits / Details: R foot numb to light touch, absent proprioception R great toe, proprioception intact R ankle, ankle PF/DF +4/5, knee ext 5/5       Communication Communication Communication: No difficulties   Cognition Arousal/Alertness: Awake/alert Behavior During Therapy: WFL for tasks assessed/performed Overall Cognitive Status: Within Functional Limits for tasks assessed                                                Home Living Family/patient expects to be discharged to:: Private residence Living Arrangements: Spouse/significant other Available Help at Discharge: Family;Available 24 hours/day Type of Home: House Home Access: Stairs to enter Entergy CorporationEntrance Stairs-Number of  Steps: 2   Home Layout: Two level;Able to live on main level with bedroom/bathroom     Bathroom Shower/Tub: Walk-in shower         Home Equipment: Shower seat - built in          Prior Functioning/Environment Level of Independence: Independent        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 [employer]        OT Problem List:  Decreased strength;Decreased activity tolerance      OT Treatment/Interventions:      OT Goals(Current goals can be found in the care plan section) Acute Rehab OT Goals Patient Stated Goal: return to work as Engineer, civil (consulting)nurse  OT Frequency:                AM-PAC PT "6 Clicks" Daily Activity     Outcome Measure Help from another person eating meals?: None Help from another person taking care of personal grooming?: None Help from another person toileting, which includes using toliet, bedpan, or urinal?: A Little Help from another person bathing (including washing, rinsing, drying)?: A Little Help from another person to put on and taking off regular upper body clothing?: None Help from another person to put on and taking off regular lower body clothing?: A Little 6 Click Score: 21   End of Session Equipment Utilized During Treatment: Rolling walker Nurse Communication: Mobility status  Activity Tolerance: Patient tolerated treatment well Patient left: in chair;with call bell/phone within reach  OT Visit Diagnosis: Pain;Unsteadiness on feet (R26.81)                Time: 1055-1110 OT Time Calculation (min): 15 min Charges:  OT General Charges $OT Visit: 1 Visit OT Evaluation $OT Eval Low Complexity: 1 Low G-Codes: OT G-codes **NOT FOR INPATIENT CLASS** Functional Assessment Tool Used: Clinical judgement Functional Limitation: Self care Self Care Current Status ([id number]([id number]): At least 20 percent but less than 40 percent impaired, limited or restricted Self Care Goal Status ([id number]([id number]): At least 1 percent but less than 20 percent impaired, limited or restricted Self Care Discharge Status [phone]([id number]): At least 20 percent but less than 40 percent impaired, limited or restricted   Lise AuerLori Severin Bou, [place]OT [phone]  Einar CrowEDDING, Keili Hasten D 03/16/2017, 11:38 AM

## 2017-03-16 NOTE — Progress Notes (Signed)
    Durable Medical Equipment  (From admission, onward)        Start     Ordered   03/16/17 1234  For home use only DME Walker rolling  Once    Question:  Patient needs a walker to treat with the following condition  Answer:  Spinal pain   03/16/17 1235   03/16/17 0921  For home use only DME Walker rolling  Once    Question:  Patient needs a walker to treat with the following condition  Answer:  Surgery, elective   03/16/17 [id number]    [phone]

## 2017-04-12 DIAGNOSIS — M4807 Spinal stenosis, lumbosacral region: Secondary | ICD-10-CM | POA: Diagnosis not present

## 2017-04-18 DIAGNOSIS — M4807 Spinal stenosis, lumbosacral region: Secondary | ICD-10-CM | POA: Diagnosis not present

## 2017-05-02 LAB — HM DIABETES EYE EXAM

## 2017-05-11 DIAGNOSIS — E119 Type 2 diabetes mellitus without complications: Secondary | ICD-10-CM | POA: Diagnosis not present

## 2017-05-26 DIAGNOSIS — H3581 Retinal edema: Secondary | ICD-10-CM | POA: Diagnosis not present

## 2017-05-26 DIAGNOSIS — E119 Type 2 diabetes mellitus without complications: Secondary | ICD-10-CM | POA: Diagnosis not present

## 2017-05-26 DIAGNOSIS — H2513 Age-related nuclear cataract, bilateral: Secondary | ICD-10-CM | POA: Diagnosis not present

## 2017-06-07 DIAGNOSIS — M5416 Radiculopathy, lumbar region: Secondary | ICD-10-CM | POA: Diagnosis not present

## 2017-06-07 DIAGNOSIS — Z4889 Encounter for other specified surgical aftercare: Secondary | ICD-10-CM | POA: Diagnosis not present

## 2017-06-12 DIAGNOSIS — Z1389 Encounter for screening for other disorder: Secondary | ICD-10-CM | POA: Diagnosis not present

## 2017-06-12 DIAGNOSIS — E039 Hypothyroidism, unspecified: Secondary | ICD-10-CM | POA: Diagnosis not present

## 2017-06-12 DIAGNOSIS — E1169 Type 2 diabetes mellitus with other specified complication: Secondary | ICD-10-CM | POA: Diagnosis not present

## 2017-06-12 DIAGNOSIS — Z008 Encounter for other general examination: Secondary | ICD-10-CM | POA: Diagnosis not present

## 2017-06-12 DIAGNOSIS — Z719 Counseling, unspecified: Secondary | ICD-10-CM | POA: Diagnosis not present

## 2017-06-12 DIAGNOSIS — I10 Essential (primary) hypertension: Secondary | ICD-10-CM | POA: Diagnosis not present

## 2017-06-19 DIAGNOSIS — H2512 Age-related nuclear cataract, left eye: Secondary | ICD-10-CM | POA: Diagnosis not present

## 2017-06-22 DIAGNOSIS — M89061 Algoneurodystrophy, right lower leg: Secondary | ICD-10-CM | POA: Diagnosis not present

## 2017-06-29 ENCOUNTER — Other Ambulatory Visit: Payer: Self-pay | Admitting: Family

## 2017-06-29 DIAGNOSIS — M7701 Medial epicondylitis, right elbow: Secondary | ICD-10-CM

## 2017-07-05 DIAGNOSIS — H2511 Age-related nuclear cataract, right eye: Secondary | ICD-10-CM | POA: Diagnosis not present

## 2017-07-10 DIAGNOSIS — H2511 Age-related nuclear cataract, right eye: Secondary | ICD-10-CM | POA: Diagnosis not present

## 2017-07-11 ENCOUNTER — Encounter: Payer: Self-pay | Admitting: Family Medicine

## 2017-07-11 LAB — HM DIABETES EYE EXAM

## 2017-07-21 DIAGNOSIS — L989 Disorder of the skin and subcutaneous tissue, unspecified: Secondary | ICD-10-CM | POA: Diagnosis not present

## 2017-07-27 DIAGNOSIS — M545 Low back pain: Secondary | ICD-10-CM | POA: Diagnosis not present

## 2017-07-27 DIAGNOSIS — R208 Other disturbances of skin sensation: Secondary | ICD-10-CM | POA: Diagnosis not present

## 2017-07-27 DIAGNOSIS — Z79891 Long term (current) use of opiate analgesic: Secondary | ICD-10-CM | POA: Diagnosis not present

## 2017-07-27 DIAGNOSIS — M79671 Pain in right foot: Secondary | ICD-10-CM | POA: Diagnosis not present

## 2017-07-27 DIAGNOSIS — E1142 Type 2 diabetes mellitus with diabetic polyneuropathy: Secondary | ICD-10-CM | POA: Diagnosis not present

## 2017-07-27 DIAGNOSIS — M5416 Radiculopathy, lumbar region: Secondary | ICD-10-CM | POA: Diagnosis not present

## 2017-07-27 DIAGNOSIS — G603 Idiopathic progressive neuropathy: Secondary | ICD-10-CM | POA: Diagnosis not present

## 2017-08-03 DIAGNOSIS — G603 Idiopathic progressive neuropathy: Secondary | ICD-10-CM | POA: Diagnosis not present

## 2017-08-03 DIAGNOSIS — M5416 Radiculopathy, lumbar region: Secondary | ICD-10-CM | POA: Diagnosis not present

## 2017-08-05 ENCOUNTER — Emergency Department (HOSPITAL_COMMUNITY)
Admission: EM | Admit: 2017-08-05 | Discharge: 2017-08-05 | Disposition: A | Discharge status: Home / Self Care | Payer: BLUE CROSS/BLUE SHIELD | Attending: Emergency Medicine | Admitting: Emergency Medicine

## 2017-08-05 ENCOUNTER — Encounter (HOSPITAL_COMMUNITY): Payer: Self-pay

## 2017-08-05 ENCOUNTER — Emergency Department (HOSPITAL_COMMUNITY): Payer: BLUE CROSS/BLUE SHIELD

## 2017-08-05 DIAGNOSIS — E119 Type 2 diabetes mellitus without complications: Secondary | ICD-10-CM | POA: Insufficient documentation

## 2017-08-05 DIAGNOSIS — Z9889 Other specified postprocedural states: Secondary | ICD-10-CM | POA: Diagnosis not present

## 2017-08-05 DIAGNOSIS — Y93H2 Activity, gardening and landscaping: Secondary | ICD-10-CM | POA: Diagnosis not present

## 2017-08-05 DIAGNOSIS — S3992XA Unspecified injury of lower back, initial encounter: Secondary | ICD-10-CM | POA: Diagnosis not present

## 2017-08-05 DIAGNOSIS — W1830XA Fall on same level, unspecified, initial encounter: Secondary | ICD-10-CM | POA: Insufficient documentation

## 2017-08-05 DIAGNOSIS — Y92007 Garden or yard of unspecified non-institutional (private) residence as the place of occurrence of the external cause: Secondary | ICD-10-CM | POA: Diagnosis not present

## 2017-08-05 DIAGNOSIS — R42 Dizziness and giddiness: Secondary | ICD-10-CM | POA: Insufficient documentation

## 2017-08-05 DIAGNOSIS — S32000A Wedge compression fracture of unspecified lumbar vertebra, initial encounter for closed fracture: Secondary | ICD-10-CM | POA: Insufficient documentation

## 2017-08-05 DIAGNOSIS — Z7984 Long term (current) use of oral hypoglycemic drugs: Secondary | ICD-10-CM | POA: Diagnosis not present

## 2017-08-05 DIAGNOSIS — Y999 Unspecified external cause status: Secondary | ICD-10-CM | POA: Diagnosis not present

## 2017-08-05 DIAGNOSIS — S32020A Wedge compression fracture of second lumbar vertebra, initial encounter for closed fracture: Secondary | ICD-10-CM | POA: Diagnosis not present

## 2017-08-05 DIAGNOSIS — M549 Dorsalgia, unspecified: Secondary | ICD-10-CM | POA: Diagnosis not present

## 2017-08-05 DIAGNOSIS — Z79899 Other long term (current) drug therapy: Secondary | ICD-10-CM | POA: Insufficient documentation

## 2017-08-05 DIAGNOSIS — M545 Low back pain: Secondary | ICD-10-CM | POA: Diagnosis present

## 2017-08-05 LAB — URINALYSIS, ROUTINE W REFLEX MICROSCOPIC
Bilirubin Urine: NEGATIVE
GLUCOSE, UA: NEGATIVE mg/dL
Hgb urine dipstick: NEGATIVE
KETONES UR: NEGATIVE mg/dL
Nitrite: NEGATIVE
PH: 7 (ref 5.0–8.0)
Protein, ur: NEGATIVE mg/dL
Specific Gravity, Urine: 1.015 (ref 1.005–1.030)

## 2017-08-05 LAB — CBC
HCT: 41.6 % (ref 36.0–46.0)
Hemoglobin: 13.6 g/dL (ref 12.0–15.0)
MCH: 28.7 pg (ref 26.0–34.0)
MCHC: 32.7 g/dL (ref 30.0–36.0)
MCV: 87.8 fL (ref 78.0–100.0)
PLATELETS: 266 10*3/uL (ref 150–400)
RBC: 4.74 MIL/uL (ref 3.87–5.11)
RDW: 13.8 % (ref 11.5–15.5)
WBC: 10.2 10*3/uL (ref 4.0–10.5)

## 2017-08-05 LAB — BASIC METABOLIC PANEL
ANION GAP: 9 (ref 5–15)
BUN: 14 mg/dL (ref 6–20)
CHLORIDE: 104 mmol/L (ref 101–111)
CO2: 27 mmol/L (ref 22–32)
CREATININE: 0.68 mg/dL (ref 0.44–1.00)
Calcium: 9.4 mg/dL (ref 8.9–10.3)
GFR calc Af Amer: >60 mL/min (ref >60)
GFR calc non Af Amer: >60 mL/min (ref >60)
Glucose, Bld: 142 mg/dL — ABNORMAL HIGH (ref 65–99)
POTASSIUM: 4 mmol/L (ref 3.5–5.1)
SODIUM: 140 mmol/L (ref 135–145)

## 2017-08-05 LAB — CBG MONITORING, ED: Glucose-Capillary: 135 mg/dL — ABNORMAL HIGH (ref 65–99)

## 2017-08-05 MED ORDER — KETOROLAC TROMETHAMINE 15 MG/ML IJ SOLN
15.0000 mg | Freq: Once | INTRAMUSCULAR | Status: AC
  Administered 2017-08-05: 15 mg via INTRAVENOUS
  Filled 2017-08-05: qty 1

## 2017-08-05 MED ORDER — OXYCODONE-ACETAMINOPHEN 5-325 MG PO TABS
2.0000 | ORAL_TABLET | Freq: Once | ORAL | Status: AC
  Administered 2017-08-05: 2 via ORAL
  Filled 2017-08-05: qty 2

## 2017-08-05 MED ORDER — OXYCODONE-ACETAMINOPHEN 5-325 MG PO TABS: 1.0000 | ORAL_TABLET | Freq: Four times a day (QID) | ORAL | 0 refills | Status: DC | PRN

## 2017-08-05 MED ORDER — METHOCARBAMOL 750 MG PO TABS: 750.0000 mg | ORAL_TABLET | Freq: Four times a day (QID) | ORAL | 0 refills | Status: DC | PRN

## 2017-08-05 NOTE — ED Triage Notes (Signed)
She tells me that, as she was bending over in her garden planting flowers, she became dizzy and "I ended up passing out and fell backward". She states she "hurt right at the spot where I had back surgery in November". She also tells me she has residual "numbness" of her right leg and foot ever since the spine surgery by Dr. Shelle IronBeane.

## 2017-08-05 NOTE — ED Provider Notes (Signed)
[facility]-EMERGENCY DEPT Provider Note   CSN: [id number] Arrival date & time: 08/05/17  1158     History   Chief Complaint Chief Complaint  Patient presents with  . Fall  . Back Pain    HPI [name] is a 67 y.o. female.  Patient is a 67 year old female with a history of diabetes, hypertension, hyperlipidemia, spinal stenosis with recent back surgery in November 2018 who presents today after a fall with worsening back pain.  Patient states that she was planting a flower out in her garden and bent over when she started feeling dizzy.  She then stood up and lost her balance falling backwards and hitting the ground.  She did not hit her head or lose consciousness.  She states she often gets dizzy like this and thinks it is related to the gabapentin which she is slowly trying to wean off of.  However since the fall she has had significant pain in her lower back and to her left side.  This pain is 10 out of 10 and worse with movements.  She states it is worse than when she had her back surgery.  She denies any new numbness or weakness in her legs.  She has no other pain anywhere else.  The history is provided by the patient.  Fall  This is a new problem. The current episode started 1 to 2 hours ago. The problem occurs constantly. The problem has not changed since onset.Associated symptoms comments: Back pain. The symptoms are aggravated by walking, bending and twisting. Nothing relieves the symptoms. Treatments tried: muscle relaxer and naproxen. The treatment provided no relief.  Back Pain      Past Medical History:  Diagnosis Date  . Anxiety   . Diabetes mellitus    type II   . Dysrhythmia    tachycardia   . GERD (gastroesophageal reflux disease)   . Hyperlipidemia   . Hypertension   . Hypothyroidism   . Neuromuscular disorder (HCC)    muscle cramps     Patient Active Problem List   Diagnosis Date Noted  . Spinal stenosis of lumbar region 03/15/2017    . Spinal stenosis at L4-L5 level 03/15/2017  . Atrophic vaginitis 02/20/2016  . Hypothyroidism 09/10/2014  . GERD (gastroesophageal reflux disease) 09/10/2014  . Vitamin D deficiency 09/10/2014  . Diabetes mellitus, type 2 (HCC) 09/10/2014  . GAD (generalized anxiety disorder) 09/10/2014  . Obesity, unspecified 03/26/2013  . Mixed hyperlipidemia 03/26/2013  . Pulmonary nodule 09/22/2011  . Cough 09/22/2011  . Hypertension 09/22/2011    Past Surgical History:  Procedure Laterality Date  . APPENDECTOMY    . CARPAL TUNNEL RELEASE    . CHOLECYSTECTOMY    . KNEE ARTHROSCOPY     left  . LUMBAR LAMINECTOMY/DECOMPRESSION MICRODISCECTOMY N/A 03/15/2017   Procedure: Microlumbar decompression L4-5, L5-S1;  Surgeon: Jene Every, MD;  Location: [facility] ORS;  Service: Orthopedics;  Laterality: N/A;  150 mins  . PARTIAL HYSTERECTOMY       OB History    Gravida  2   Para  2   Term  2   Preterm      AB      Living  2     SAB      TAB      Ectopic      Multiple      Live Births               Home Medications  Prior to Admission medications   Medication Sig Start Date End Date Taking? Authorizing Provider  Biotin 5000 MCG TABS Take 10,000 mcg daily by mouth.    Yes [provider]  Cholecalciferol (VITAMIN D PO) Take 1 tablet by mouth daily.   Yes [provider]  conjugated estrogens (PREMARIN) vaginal cream Place 1 Applicatorful vaginally 3 (three) times a week. For vaginal thinning and irritation Patient taking differently: Place 1 Applicatorful vaginally daily as needed. For vaginal thinning and irritation 02/19/16  Yes Tilda Burrow, MD  esomeprazole (NEXIUM) 40 MG capsule Take 40 mg by mouth daily before breakfast.   Yes [provider]  Flaxseed, Linseed, (FLAX SEEDS PO) Take 1 tablet by mouth daily.   Yes [provider]  gabapentin (NEURONTIN) 300 MG capsule 600 mg at night, 300 mg in am Patient taking differently: Take  300 mg by mouth 3 (three) times daily.  08/17/15  Yes Elenora Gamma, MD  ibuprofen (ADVIL,MOTRIN) 200 MG tablet Take 1 tablet (200 mg total) every 8 (eight) hours as needed by mouth for mild pain. May resume 5 days post-op as needed 03/16/17  Yes Bissell, Lockie Pares M, PA-C  levothyroxine (SYNTHROID, LEVOTHROID) 88 MCG tablet Take 88 mcg by mouth every evening.    Yes [provider]  lisinopril (PRINIVIL,ZESTRIL) 10 MG tablet Take 10 mg by mouth every morning.  10/03/16  Yes [provider]  metFORMIN (GLUCOPHAGE-XR) 500 MG 24 hr tablet Take 1,000 mg 3 (three) times daily by mouth.    Yes [provider]  methocarbamol (ROBAXIN) 750 MG tablet Take 750 mg by mouth every 6 (six) hours as needed for muscle spasms.    Yes [provider]  metoprolol tartrate (LOPRESSOR) 25 MG tablet Take 25 mg by mouth 2 (two) times daily.    Yes [provider]  multivitamin-lutein (OCUVITE-LUTEIN) CAPS capsule Take 1 capsule by mouth daily.   Yes [provider]  naproxen (NAPROSYN) 500 MG tablet Take 1 tablet (500 mg total) 2 (two) times daily with a meal by mouth. Resume 5 days post-op as needed Patient taking differently: Take 500 mg by mouth every 6 (six) hours.  03/16/17  Yes Andrez Grime M, PA-C  Polyethyl Glycol-Propyl Glycol (SYSTANE OP) Apply 1 drop to eye 2 (two) times daily as needed (dry eyes).    Yes [provider]  prednisoLONE acetate (PRED FORTE) 1 % ophthalmic suspension Place 1 drop into both eyes 4 (four) times daily. 08/06/17 08/06/17 Yes [provider]  rosuvastatin (CRESTOR) 5 MG tablet Take 5 mg by mouth at bedtime.    Yes [provider]  spironolactone (ALDACTONE) 25 MG tablet Take 12.5 mg by mouth daily.   Yes [provider]  ALPRAZolam Prudy Feeler) 0.5 MG tablet Take 1 tablet (0.5 mg total) by mouth at bedtime as needed for anxiety. Patient not taking: Reported on 08/05/2017 01/05/17   Dettinger, Elige Radon, MD    docusate sodium (COLACE) 100 MG capsule Take 1 capsule (100 mg total) 2 (two) times daily as needed by mouth for mild constipation. Patient not taking: Reported on 08/05/2017 03/15/17   Jene Every, MD  HYDROcodone-acetaminophen (NORCO/VICODIN) 5-325 MG tablet Take 1-2 tablets every 4 (four) hours as needed by mouth for severe pain. Patient not taking: Reported on 08/05/2017 03/15/17   Jene Every, MD  naproxen (NAPROSYN) 500 MG tablet TAKE  (1)  TABLET TWICE A DAY WITH MEALS (BREAKFAST AND SUPPER) Patient not taking: Reported on 08/05/2017 06/29/17  Dettinger, Elige Radon, MD  polyethylene glycol (MIRALAX / GLYCOLAX) packet Take 17 g daily by mouth. Patient not taking: Reported on 08/05/2017 03/15/17   Jene Every, MD    Family History Family History  Problem Relation Age of Onset  . Heart disease Mother   . Heart disease Father   . Colon cancer Father   . Brain cancer Father   . Lung cancer Maternal Uncle        smoked  . Lung cancer Maternal Uncle        smoked    Social History Social History   Tobacco Use  . Smoking status: Never Smoker  . Smokeless tobacco: Never Used  Substance Use Topics  . Alcohol use: No  . Drug use: No     Allergies   Dilaudid [hydromorphone hcl] and Hydromorphone   Review of Systems Review of Systems  Musculoskeletal: Positive for back pain.  All other systems reviewed and are negative.    Physical Exam Updated Vital Signs BP (!) 150/95 (BP Location: Right Arm)   Pulse 86   Temp 98.2 F (36.8 C) (Oral)   Resp 18   SpO2 96%   Physical Exam  Constitutional: She is oriented to person, place, and time. She appears well-developed and well-nourished. No distress.  HENT:  Head: Normocephalic and atraumatic.  Mouth/Throat: Oropharynx is clear and moist.  Eyes: Pupils are equal, round, and reactive to light. Conjunctivae and EOM are normal.  Neck: Normal range of motion. Neck supple.  Cardiovascular: Normal rate, regular rhythm and  intact distal pulses.  No murmur heard. Pulmonary/Chest: Effort normal and breath sounds normal. No respiratory distress. She has no wheezes. She has no rales.  Abdominal: Soft. She exhibits no distension. There is no tenderness. There is no rebound and no guarding.  Musculoskeletal: She exhibits tenderness. She exhibits no edema.       Lumbar back: She exhibits decreased range of motion, tenderness, bony tenderness, pain and spasm. She exhibits no deformity.       Back:  Neurological: She is alert and oriented to person, place, and time.  Skin: Skin is warm and dry. No rash noted. No erythema.  Psychiatric: She has a normal mood and affect. Her behavior is normal.  Nursing note and vitals reviewed.    ED Treatments / Results  Labs (all labs ordered are listed, but only abnormal results are displayed) Labs Reviewed  BASIC METABOLIC PANEL - Abnormal; Notable for the following components:      Result Value   Glucose, Bld 142 (*)    All other components within normal limits  URINALYSIS, ROUTINE W REFLEX MICROSCOPIC - Abnormal; Notable for the following components:   Leukocytes, UA LARGE (*)    Bacteria, UA RARE (*)    Squamous Epithelial / LPF 0-5 (*)    All other components within normal limits  CBG MONITORING, ED - Abnormal; Notable for the following components:   Glucose-Capillary 135 (*)    All other components within normal limits  CBC    EKG None  Radiology Ct Lumbar Spine Wo Contrast  Result Date: 08/05/2017 CLINICAL DATA:  Dizziness with falling backwards. Back pain after the fall. Previous surgery. EXAM: CT LUMBAR SPINE WITHOUT CONTRAST TECHNIQUE: Multidetector CT imaging of the lumbar spine was performed without intravenous contrast administration. Multiplanar CT image reconstructions were also generated. COMPARISON:  Radiography 03/15/2017 and 03/10/2017. FINDINGS: Segmentation: 5 lumbar type vertebral bodies. Alignment: Normal Vertebrae: Acute superior endplate  fracture at L2 with loss  of height of 10%. Posterior bowing of the posterosuperior margin of the vertebral body of 3 mm, non-compressive. No other acute finding. Paraspinal and other soft tissues: Negative except for aortic atherosclerosis. Disc levels: No significant finding at T11-12 or T12-L1. L1-2: Minimal bulging of the disc. No compressive stenosis. Mild facet degeneration. L2-3: Mild bulging of the disc. Facet degeneration and hypertrophy with ligamentous calcification more on the right. Mild stenosis of both lateral recesses right more than left. L3-4: Bulging of the disc. Bilateral facet degeneration and hypertrophy with ligamentous calcification. Mild stenosis of both lateral recesses. L4-5: Previous posterior decompression. Circumferential bulging of the disc. Mild lateral recess and foraminal stenosis without likely neural compression. L5-S1: Mild bulging of the disc. Previous posterior decompression. Mild narrowing of the lateral recesses and foramina. No acute finding. IMPRESSION: Acute superior endplate fracture at L2 with loss of height of only about 10%. Mild posterior bowing of the posterosuperior margin of the vertebral body by about 3 mm. No suspected neural compression on the basis of this. Previous posterior decompression L4-5 and L5-S1 with wide patency of the central canal, but persistent lateral recess and foraminal narrowing. L2-3 and L3-4 disc bulges and facet/ligamentous hypertrophy with mild stenosis of the lateral recesses. Electronically Signed   By: Paulina FusiMark  Shogry M.D.   On: 08/05/2017 13:43    Procedures Procedures (including critical care time)  Medications Ordered in ED Medications  oxyCODONE-acetaminophen (PERCOCET/ROXICET) 5-325 MG per tablet 2 tablet (has no administration in time range)  ketorolac (TORADOL) 15 MG/ML injection 15 mg (has no administration in time range)     Initial Impression / Assessment and Plan / ED Course  I have reviewed the triage vital signs  and the nursing notes.  Pertinent labs & imaging results that were available during my care of the patient were reviewed by me and considered in my medical decision making (see chart for details).     Patient presenting today after falling.  Patient lost her balance and was dizzy after bending over but does not seem presyncopal and did not lose consciousness.  Patient states that is been happening since she has been on gabapentin and has been trying to wean off.  Low suspicion for cardiac event and patient is otherwise normal here except for back pain.  She denies hitting her head.  Having severe pain in her back where she recently had surgery but no neurologic compromise.  Patient has chronic numbness below the knee on the right foot but she states that is unchanged.  She did try naproxen prior to coming but is not improved her pain.  We will do a CT to ensure no acute injury of that area of her back.  She was given pain medication here  2:09 PM Labs are reassuring.  UA with possible infection however patient is not having any urinary symptoms so we will culture the urine.  Patient's CT showed a new acute superior endplate fracture of only 10% height loss without cord compromise.  Since pain is somewhat improved after medications.  Recommended discharge home and follow-up with Dr. Shelle IronBeane.  Encouraged no heavy lifting or excessive bending.  Patient is going to wear an elastic back brace as needed for pain control.  Final Clinical Impressions(s) / ED Diagnoses   Final diagnoses:  Lumbar compression fracture, closed, initial encounter [facility](HCC)    ED Discharge Orders        Ordered    oxyCODONE-acetaminophen (PERCOCET/ROXICET) 5-325 MG tablet  Every 6 hours PRN  08/05/17 1412    methocarbamol (ROBAXIN) 750 MG tablet  Every 6 hours PRN     08/05/17 1412       Gwyneth Sprout, MD 08/05/17 1412

## 2017-08-06 LAB — URINE CULTURE: Culture: <10000 — AB

## 2017-08-08 DIAGNOSIS — M4856XA Collapsed vertebra, not elsewhere classified, lumbar region, initial encounter for fracture: Secondary | ICD-10-CM | POA: Diagnosis not present

## 2017-08-15 DIAGNOSIS — M4856XD Collapsed vertebra, not elsewhere classified, lumbar region, subsequent encounter for fracture with routine healing: Secondary | ICD-10-CM | POA: Diagnosis not present

## 2017-09-05 DIAGNOSIS — M4856XD Collapsed vertebra, not elsewhere classified, lumbar region, subsequent encounter for fracture with routine healing: Secondary | ICD-10-CM | POA: Diagnosis not present

## 2017-09-06 DIAGNOSIS — Z719 Counseling, unspecified: Secondary | ICD-10-CM | POA: Diagnosis not present

## 2017-09-06 DIAGNOSIS — E039 Hypothyroidism, unspecified: Secondary | ICD-10-CM | POA: Diagnosis not present

## 2017-09-06 DIAGNOSIS — Z008 Encounter for other general examination: Secondary | ICD-10-CM | POA: Diagnosis not present

## 2017-09-06 DIAGNOSIS — I10 Essential (primary) hypertension: Secondary | ICD-10-CM | POA: Diagnosis not present

## 2017-09-06 DIAGNOSIS — E1169 Type 2 diabetes mellitus with other specified complication: Secondary | ICD-10-CM | POA: Diagnosis not present

## 2017-09-14 DIAGNOSIS — M48061 Spinal stenosis, lumbar region without neurogenic claudication: Secondary | ICD-10-CM | POA: Diagnosis not present

## 2017-09-14 DIAGNOSIS — I10 Essential (primary) hypertension: Secondary | ICD-10-CM | POA: Diagnosis not present

## 2017-09-14 DIAGNOSIS — M5416 Radiculopathy, lumbar region: Secondary | ICD-10-CM | POA: Diagnosis not present

## 2017-09-14 DIAGNOSIS — M5136 Other intervertebral disc degeneration, lumbar region: Secondary | ICD-10-CM | POA: Diagnosis not present

## 2017-09-18 ENCOUNTER — Other Ambulatory Visit (HOSPITAL_COMMUNITY): Payer: Self-pay | Admitting: Neurosurgery

## 2017-09-18 DIAGNOSIS — M8580 Other specified disorders of bone density and structure, unspecified site: Secondary | ICD-10-CM

## 2017-10-03 DIAGNOSIS — M4856XD Collapsed vertebra, not elsewhere classified, lumbar region, subsequent encounter for fracture with routine healing: Secondary | ICD-10-CM | POA: Diagnosis not present

## 2017-10-03 DIAGNOSIS — M5136 Other intervertebral disc degeneration, lumbar region: Secondary | ICD-10-CM | POA: Diagnosis not present

## 2017-10-09 ENCOUNTER — Other Ambulatory Visit (INDEPENDENT_AMBULATORY_CARE_PROVIDER_SITE_OTHER): Payer: BLUE CROSS/BLUE SHIELD

## 2017-10-09 DIAGNOSIS — M8580 Other specified disorders of bone density and structure, unspecified site: Secondary | ICD-10-CM

## 2017-10-09 DIAGNOSIS — M85852 Other specified disorders of bone density and structure, left thigh: Secondary | ICD-10-CM | POA: Diagnosis not present

## 2017-10-09 DIAGNOSIS — M8588 Other specified disorders of bone density and structure, other site: Secondary | ICD-10-CM

## 2017-10-11 DIAGNOSIS — Z1231 Encounter for screening mammogram for malignant neoplasm of breast: Secondary | ICD-10-CM | POA: Diagnosis not present

## 2017-10-31 DIAGNOSIS — M4800 Spinal stenosis, site unspecified: Secondary | ICD-10-CM | POA: Diagnosis not present

## 2017-10-31 DIAGNOSIS — M4856XD Collapsed vertebra, not elsewhere classified, lumbar region, subsequent encounter for fracture with routine healing: Secondary | ICD-10-CM | POA: Diagnosis not present

## 2017-10-31 DIAGNOSIS — M519 Unspecified thoracic, thoracolumbar and lumbosacral intervertebral disc disorder: Secondary | ICD-10-CM | POA: Diagnosis not present

## 2017-11-06 DIAGNOSIS — M5416 Radiculopathy, lumbar region: Secondary | ICD-10-CM | POA: Diagnosis not present

## 2017-11-06 DIAGNOSIS — Z6831 Body mass index (BMI) 31.0-31.9, adult: Secondary | ICD-10-CM | POA: Diagnosis not present

## 2017-11-06 DIAGNOSIS — M5136 Other intervertebral disc degeneration, lumbar region: Secondary | ICD-10-CM | POA: Diagnosis not present

## 2017-11-06 DIAGNOSIS — M48061 Spinal stenosis, lumbar region without neurogenic claudication: Secondary | ICD-10-CM | POA: Diagnosis not present

## 2017-11-07 ENCOUNTER — Encounter: Payer: Self-pay | Admitting: Physical Therapy

## 2017-11-07 ENCOUNTER — Ambulatory Visit: Payer: BLUE CROSS/BLUE SHIELD | Attending: Specialist | Admitting: Physical Therapy

## 2017-11-07 ENCOUNTER — Other Ambulatory Visit: Payer: Self-pay

## 2017-11-07 DIAGNOSIS — G8929 Other chronic pain: Secondary | ICD-10-CM

## 2017-11-07 DIAGNOSIS — M545 Low back pain: Secondary | ICD-10-CM | POA: Diagnosis not present

## 2017-11-07 DIAGNOSIS — R262 Difficulty in walking, not elsewhere classified: Secondary | ICD-10-CM | POA: Diagnosis not present

## 2017-11-07 NOTE — Therapy (Addendum)
[facility] [place], [place], [address] Phone: [phone]   Fax:  [phone]  Physical Therapy Evaluation/Discharge  PHYSICAL THERAPY DISCHARGE SUMMARY  Visits from Start of Care: 1   Current functional level related to goals / functional outcomes: See below   Remaining deficits: See goals; not met   Education / Equipment: HEP Plan: Patient agrees to discharge.  Patient goals were not met. Patient is being discharged due to not returning since the last visit.  ?????   [name], PT, DPT 05/23/18    Patient Details  Name: [name] MRN: [id number] Date of Birth: May 24, 1950 Referring Provider: Donnald Garre, MD   Encounter Date: 11/07/2017  PT End of Session - 11/07/17 2128    Visit Number  1    Number of Visits  8    Date for PT Re-Evaluation  12/05/17    Authorization Type  Progress note every 10th visit    PT Start Time  1520    PT Stop Time  1600    PT Time Calculation (min)  40 min    Activity Tolerance  Patient tolerated treatment well    Behavior During Therapy  [facility] for tasks assessed/performed       Past Medical History:  Diagnosis Date  . Anxiety   . Diabetes mellitus    type II   . Dysrhythmia    tachycardia   . GERD (gastroesophageal reflux disease)   . Hyperlipidemia   . Hypertension   . Hypothyroidism   . Neuromuscular disorder ([place])    muscle cramps     Past Surgical History:  Procedure Laterality Date  . APPENDECTOMY    . CARPAL TUNNEL RELEASE    . CHOLECYSTECTOMY    . KNEE ARTHROSCOPY     left  . LUMBAR LAMINECTOMY/DECOMPRESSION MICRODISCECTOMY N/A 03/15/2017   Procedure: Microlumbar decompression L4-5, L5-S1;  Surgeon: Susa Day, MD;  Location: [facility];  Service: Orthopedics;  Laterality: N/A;  150 mins  . PARTIAL HYSTERECTOMY      There were no vitals filed for this visit.   Subjective Assessment - 11/07/17 2125    Subjective  Patient arrives to  physical therapy with reports of ongoing low back pain and R leg pain secondary to a fall in April 2019 and lumbar disc surgery in November 2018. Patient stated she had lumbar disc repair in Nov of 2018 with reports of routine healing and improvements in symptoms. In April of 2019 she passed out due to an unknown cause an fell flat on her back fracturing L2. Patient reports pain was 10/10 after fall and stated she reverted in progress and mobility secondary to the fall. Patient reports constant 2/10 low back pain and constant R leg/foot numbness. Patient reports at worst pain can rise to 6/10 with increased activity. Patient reports despite moderate levels of pain, pain does not prevent her from performing her usual activities or ADLs. Patient's goals are to improve strength, improve movement and decrease pain.     Pertinent History  HTN, DM, Tachycardia    Limitations  House hold activities    Diagnostic tests  X-ray: routine healing of L2     Patient Stated Goals  Improve movement and strength    Currently in Pain?  Yes    Pain Score  2     Pain Location  Back    Pain Orientation  Lower    Pain Descriptors / Indicators  Sore;Aching    Pain Type  Chronic pain    Pain Onset  More than a month ago    Pain Frequency  Constant    Aggravating Factors   medication    Pain Relieving Factors  standing, walking, lifting         [facility] PT Assessment - 11/07/17 0001      Assessment   Medical Diagnosis  Compression fracture of L2    Referring Provider  Donnald Garre, MD    Onset Date/Surgical Date  -- April 2019    Next MD Visit  Oct 2019    Prior Therapy  no      Precautions   Precautions  Back    Precaution Comments  No bending, lifting >10 lbs, no twisting      Restrictions   Weight Bearing Restrictions  No      Balance Screen   Has the patient fallen in the past 6 months  Yes    How many times?  1    Has the patient had a decrease in activity level because of a fear of falling?   No     Is the patient reluctant to leave their home because of a fear of falling?   No      Home Film/video editor residence      Prior Function   Level of Independence  Independent    Vocation  Full time employment    Vocation Requirements  Occupational Nurse      ROM / Strength   AROM / PROM / Strength  Strength      Strength   Strength Assessment Site  Hip;Knee    Right/Left Hip  Right;Left    Right Hip Flexion  4-/5    Right Hip Extension  3+/5    Right Hip ABduction  4-/5    Left Hip Flexion  4/5    Left Hip Extension  3+/5    Left Hip ABduction  3+/5    Right/Left Knee  Right;Left    Right Knee Flexion  4+/5    Right Knee Extension  5/5    Left Knee Flexion  4+/5    Left Knee Extension  5/5      Transfers   Transfers  Supine to Sit    Supine to Sit  6: Modified independent (Device/Increase time)      Ambulation/Gait   Ambulation Distance (Feet)  65 Feet    Gait Pattern  Step-through pattern;Decreased step length - left;Decreased stride length;Decreased hip/knee flexion - right;Decreased weight shift to right;Trendelenburg;Wide base of support                Objective measurements completed on examination: See above findings.      [facility] Adult PT Treatment/Exercise - 11/07/17 0001      Exercises   Exercises  Lumbar      Lumbar Exercises: Standing   Functional Squats  10 reps    Functional Squats Limitations  mini squats    Row  Strengthening;Both;10 reps    Theraband Level (Row)  Level 2 (Red)    Shoulder Extension  AROM;Strengthening;Both;10 reps      Lumbar Exercises: Supine   Clam  20 reps    Clam Limitations  red    Bent Knee Raise  10 reps    Bridge  15 reps             PT Education - 11/07/17 2127    Education Details  Draw in, bridges, supine  clam shells, marching, rows, shld ext, mini squats    Person(s) Educated  Patient    Methods  Explanation;Demonstration;Handout    Comprehension  Verbalized  understanding;Returned demonstration          PT Long Term Goals - 11/07/17 2135      PT LONG TERM GOAL #1   Title  Patient will be independent with HEP    Time  4    Period  Weeks    Status  New      PT LONG TERM GOAL #2   Title  Patient will demonstrate 4+/5 or greater bilateral LE strength to improve stability during functional tasks    Time  4    Period  Weeks    Status  New      PT LONG TERM GOAL #3   Title  Patient will report abiity to walk community distances with pain less than or equal to 2/10.    Time  4    Period  Weeks    Status  New             Plan - 11/07/17 2130    Clinical Impression Statement  Patient is a 67 year old female who presents to physical therapy with low back pain, and decreased LE strength. Patient noted with decreased sensation from R mid calf to foot. Patient demonstrated good log rolling transition for supine <-> sit with increased time to perform activity. Patient ambulates with decreased R weight shift, Trendelenburg gait, and decreased right stance time. Per patient request, patient provided with HEP for patient to perform at home and will schedule PT visits if pain and function does not improve or if she requires progression to HEP. Patient may benefit from skilled physical therapy to decrease pain and address patient's goals.     Clinical Presentation  Stable    Clinical Decision Making  Low    Rehab Potential  Good    PT Frequency  2x / week    PT Duration  4 weeks    PT Treatment/Interventions  Iontophoresis 38m/ml Dexamethasone;ADLs/Self Care Home Management;Neuromuscular re-education;Cryotherapy;Electrical Stimulation;Passive range of motion;Manual techniques;Taping;Patient/family education;Therapeutic exercise;Balance training;Therapeutic activities;Moist Heat    PT Next Visit Plan  Nustep, core stabilization, modalities PRN for pain    PT Home Exercise Plan  see patient education    Consulted and Agree with Plan of Care   Patient       Patient will benefit from skilled therapeutic intervention in order to improve the following deficits and impairments:  Pain, Decreased endurance, Decreased strength, Impaired sensation, Difficulty walking  Visit Diagnosis: Chronic low back pain, unspecified back pain laterality, with sciatica presence unspecified  Difficulty in walking, not elsewhere classified     Problem List Patient Active Problem List   Diagnosis Date Noted  . Spinal stenosis of lumbar region 03/15/2017  . Spinal stenosis at L4-L5 level 03/15/2017  . Atrophic vaginitis 02/20/2016  . Hypothyroidism 09/10/2014  . GERD (gastroesophageal reflux disease) 09/10/2014  . Vitamin D deficiency 09/10/2014  . Diabetes mellitus, type 2 ([place] 09/10/2014  . GAD (generalized anxiety disorder) 09/10/2014  . Obesity, unspecified 03/26/2013  . Mixed hyperlipidemia 03/26/2013  . Pulmonary nodule 09/22/2011  . Cough 09/22/2011  . Hypertension 09/22/2011    KGabriela Singh PT, DPT 11/07/2017, 9:44 PM  [facility] [address] [place] [address]Phone: [phone]  Fax:  [phone] Name: Amanda HACKMRN: [id number] of Birth: [birth year]

## 2018-01-08 DIAGNOSIS — E1169 Type 2 diabetes mellitus with other specified complication: Secondary | ICD-10-CM | POA: Diagnosis not present

## 2018-01-08 DIAGNOSIS — Z008 Encounter for other general examination: Secondary | ICD-10-CM | POA: Diagnosis not present

## 2018-01-08 DIAGNOSIS — J0100 Acute maxillary sinusitis, unspecified: Secondary | ICD-10-CM | POA: Diagnosis not present

## 2018-01-08 DIAGNOSIS — Z719 Counseling, unspecified: Secondary | ICD-10-CM | POA: Diagnosis not present

## 2018-01-08 DIAGNOSIS — E039 Hypothyroidism, unspecified: Secondary | ICD-10-CM | POA: Diagnosis not present

## 2018-01-15 DIAGNOSIS — E782 Mixed hyperlipidemia: Secondary | ICD-10-CM | POA: Diagnosis not present

## 2018-01-15 DIAGNOSIS — Z139 Encounter for screening, unspecified: Secondary | ICD-10-CM | POA: Diagnosis not present

## 2018-01-15 DIAGNOSIS — E1169 Type 2 diabetes mellitus with other specified complication: Secondary | ICD-10-CM | POA: Diagnosis not present

## 2018-01-15 DIAGNOSIS — R05 Cough: Secondary | ICD-10-CM | POA: Diagnosis not present

## 2018-01-15 DIAGNOSIS — Z79899 Other long term (current) drug therapy: Secondary | ICD-10-CM | POA: Diagnosis not present

## 2018-01-15 DIAGNOSIS — Z0130 Encounter for examination of blood pressure without abnormal findings: Secondary | ICD-10-CM | POA: Diagnosis not present

## 2018-01-15 DIAGNOSIS — E039 Hypothyroidism, unspecified: Secondary | ICD-10-CM | POA: Diagnosis not present

## 2018-01-30 DIAGNOSIS — Z23 Encounter for immunization: Secondary | ICD-10-CM | POA: Diagnosis not present

## 2018-02-06 DIAGNOSIS — Z008 Encounter for other general examination: Secondary | ICD-10-CM | POA: Diagnosis not present

## 2018-02-06 DIAGNOSIS — E1169 Type 2 diabetes mellitus with other specified complication: Secondary | ICD-10-CM | POA: Diagnosis not present

## 2018-02-06 DIAGNOSIS — E039 Hypothyroidism, unspecified: Secondary | ICD-10-CM | POA: Diagnosis not present

## 2018-02-06 DIAGNOSIS — Z6831 Body mass index (BMI) 31.0-31.9, adult: Secondary | ICD-10-CM | POA: Diagnosis not present

## 2018-02-13 DIAGNOSIS — R42 Dizziness and giddiness: Secondary | ICD-10-CM | POA: Diagnosis not present

## 2018-02-13 DIAGNOSIS — H903 Sensorineural hearing loss, bilateral: Secondary | ICD-10-CM | POA: Diagnosis not present

## 2018-02-13 DIAGNOSIS — H918X2 Other specified hearing loss, left ear: Secondary | ICD-10-CM | POA: Diagnosis not present

## 2018-02-27 ENCOUNTER — Encounter: Payer: Self-pay | Admitting: Family Medicine

## 2018-02-27 ENCOUNTER — Ambulatory Visit: Payer: BLUE CROSS/BLUE SHIELD | Admitting: Family Medicine

## 2018-02-27 ENCOUNTER — Ambulatory Visit (INDEPENDENT_AMBULATORY_CARE_PROVIDER_SITE_OTHER): Payer: BLUE CROSS/BLUE SHIELD

## 2018-02-27 VITALS — BP 116/81 | HR 99 | Temp 98.1°F | Ht 68.0 in | Wt 204.0 lb

## 2018-02-27 DIAGNOSIS — J0140 Acute pansinusitis, unspecified: Secondary | ICD-10-CM

## 2018-02-27 DIAGNOSIS — R059 Cough, unspecified: Secondary | ICD-10-CM

## 2018-02-27 DIAGNOSIS — R05 Cough: Secondary | ICD-10-CM

## 2018-02-27 MED ORDER — MOXIFLOXACIN HCL 400 MG PO TABS: 400.0000 mg | ORAL_TABLET | Freq: Every day | ORAL | 0 refills | Status: DC

## 2018-02-27 MED ORDER — METHYLPREDNISOLONE 8 MG PO TABS: ORAL_TABLET | ORAL | 0 refills | Status: DC

## 2018-02-27 NOTE — Progress Notes (Signed)
Chief Complaint  Patient presents with  . URI    nasal congestion and "crackling" in upper chest. Has had a 10 day course of amoxicillin and has taken OTC cough medicine.    HPI  Patient presents today for Patient presents with upper respiratory congestion. Three months of sinus congestion that has not responded to amoxicillin. Currently having work up for acoustic neuroma due to ongoing sx including tinnitus. Patient reports coughing frequently as well.  No sputum noted. There is no fever, chills or sweats. The patient denies being short of breath. Onset was 3 months ago. Gradually worsening.  PMH: Smoking status noted ROS: Per HPI  Objective: BP 116/81 (BP Location: Left Arm, Patient Position: Sitting, Cuff Size: Large)   Pulse 99   Temp 98.1 F (36.7 C)   Ht 5\' 8"  (1.727 m)   Wt 204 lb (92.5 kg)   SpO2 97%   BMI 31.02 kg/m  Gen: NAD, alert, cooperative with exam HEENT: NCAT, Nasal passages swollen, red . Max sinuses tender.  CV: RRR, good S1/S2, no murmur Resp:CTAExt: No edema, warm Neuro: Alert and oriented, No gross deficits  Assessment and plan:  1. Acute pansinusitis, recurrence not specified   2. Cough     Meds ordered this encounter  Medications  . moxifloxacin (AVELOX) 400 MG tablet    Sig: Take 1 tablet (400 mg total) by mouth daily. Take all of these, for infection    Dispense:  10 tablet    Refill:  0  . methylPREDNISolone (MEDROL) 8 MG tablet    Sig: 4 daily for 2 days, then 3 daily for 2 days, then 2 daily for 2 days, then 1 daily for 2 days    Dispense:  20 tablet    Refill:  0    Orders Placed This Encounter  Procedures  . DG Chest 2 View    Standing Status:   Future    Number of Occurrences:   1    Standing Expiration Date:   04/29/2019    Order Specific Question:   Reason for Exam (SYMPTOM  OR DIAGNOSIS REQUIRED)    Answer:   COUGH    Order Specific Question:   Preferred imaging location?    Answer:   Internal    Follow up as  needed.  Mechele Claude, MD

## 2018-02-28 DIAGNOSIS — M48061 Spinal stenosis, lumbar region without neurogenic claudication: Secondary | ICD-10-CM | POA: Diagnosis not present

## 2018-02-28 DIAGNOSIS — I10 Essential (primary) hypertension: Secondary | ICD-10-CM | POA: Diagnosis not present

## 2018-02-28 DIAGNOSIS — M5136 Other intervertebral disc degeneration, lumbar region: Secondary | ICD-10-CM | POA: Diagnosis not present

## 2018-02-28 DIAGNOSIS — M5416 Radiculopathy, lumbar region: Secondary | ICD-10-CM | POA: Diagnosis not present

## 2018-03-07 ENCOUNTER — Other Ambulatory Visit: Payer: Self-pay | Admitting: Otolaryngology

## 2018-03-07 DIAGNOSIS — H918X2 Other specified hearing loss, left ear: Secondary | ICD-10-CM

## 2018-03-07 DIAGNOSIS — IMO0001 Reserved for inherently not codable concepts without codable children: Secondary | ICD-10-CM

## 2018-03-07 DIAGNOSIS — R42 Dizziness and giddiness: Secondary | ICD-10-CM

## 2018-03-12 ENCOUNTER — Other Ambulatory Visit: Payer: Self-pay | Admitting: Family Medicine

## 2018-03-12 ENCOUNTER — Encounter: Payer: Self-pay | Admitting: Family Medicine

## 2018-03-12 MED ORDER — CLOTRIMAZOLE 10 MG MT TROC
OROMUCOSAL | 2 refills | Status: DC
Start: 2018-03-12 — End: 2018-04-16

## 2018-03-12 MED ORDER — FLUCONAZOLE 150 MG PO TABS: 150.0000 mg | ORAL_TABLET | Freq: Once | ORAL | 0 refills | Status: AC

## 2018-03-17 ENCOUNTER — Ambulatory Visit
Admission: RE | Admit: 2018-03-17 | Discharge: 2018-03-17 | Disposition: A | Discharge status: Home / Self Care | Payer: BLUE CROSS/BLUE SHIELD | Source: Ambulatory Visit | Attending: Otolaryngology | Admitting: Otolaryngology

## 2018-03-17 DIAGNOSIS — R42 Dizziness and giddiness: Secondary | ICD-10-CM

## 2018-03-17 DIAGNOSIS — H918X2 Other specified hearing loss, left ear: Secondary | ICD-10-CM

## 2018-03-17 DIAGNOSIS — H9192 Unspecified hearing loss, left ear: Secondary | ICD-10-CM | POA: Diagnosis not present

## 2018-03-17 DIAGNOSIS — IMO0001 Reserved for inherently not codable concepts without codable children: Secondary | ICD-10-CM

## 2018-03-17 MED ORDER — GADOBENATE DIMEGLUMINE 529 MG/ML IV SOLN
19.0000 mL | Freq: Once | INTRAVENOUS | Status: AC | PRN
  Administered 2018-03-17: 19 mL via INTRAVENOUS

## 2018-04-16 ENCOUNTER — Ambulatory Visit: Payer: BLUE CROSS/BLUE SHIELD | Admitting: Nurse Practitioner

## 2018-04-16 ENCOUNTER — Telehealth: Payer: Self-pay | Admitting: Family Medicine

## 2018-04-16 ENCOUNTER — Encounter: Payer: Self-pay | Admitting: Nurse Practitioner

## 2018-04-16 VITALS — BP 132/73 | HR 90 | Temp 97.9°F | Ht 68.0 in | Wt 204.2 lb

## 2018-04-16 DIAGNOSIS — J069 Acute upper respiratory infection, unspecified: Secondary | ICD-10-CM | POA: Diagnosis not present

## 2018-04-16 MED ORDER — HYDROCODONE-HOMATROPINE 5-1.5 MG/5ML PO SYRP
5.0000 mL | ORAL_SOLUTION | Freq: Four times a day (QID) | ORAL | 0 refills | Status: DC | PRN
Start: 2018-04-16 — End: 2018-06-25

## 2018-04-16 MED ORDER — PREDNISONE 20 MG PO TABS: ORAL_TABLET | ORAL | 0 refills | Status: DC

## 2018-04-16 MED ORDER — DOXYCYCLINE HYCLATE 100 MG PO TABS: 100.0000 mg | ORAL_TABLET | Freq: Two times a day (BID) | ORAL | 0 refills | Status: DC

## 2018-04-16 NOTE — Telephone Encounter (Signed)
Pt scheduled at 5:45 today in after hours for evaluation.

## 2018-04-16 NOTE — Patient Instructions (Signed)

## 2018-04-16 NOTE — Addendum Note (Signed)
Addended by: Bennie PieriniMARTIN, MARY-MARGARET on: 04/16/2018 05:54 PM   Modules accepted: Orders

## 2018-04-16 NOTE — Progress Notes (Signed)
Subjective:    Patient ID: [name], female    DOB: Oct 16, 1950, 67 y.o.   MRN: [id number]   Chief Complaint: cough and congestion  HPI Patient comes in today c/o cough and congestion intermittently since august. Her voice is going in and out. She has tried all OTC meds she could do with only mild improvement. Cough has gotten worse the last several days.    * she was seen in October by Dr. Darlyn Read and was dx with sinusitis and was given avelox and steroids- helped some but came right back.  Review of Systems  Constitutional: Negative.   HENT: Positive for congestion, rhinorrhea and voice change. Negative for ear pain, sore throat and trouble swallowing.   Respiratory: Positive for cough. Negative for shortness of breath.   Gastrointestinal: Negative.   Genitourinary: Negative.   Neurological: Negative.   Psychiatric/Behavioral: Negative.   All other systems reviewed and are negative.      Objective:   Physical Exam Constitutional:      Appearance: She is obese.  HENT:     Head: Normocephalic.     Right Ear: Tympanic membrane, ear canal and external ear normal.     Left Ear: Tympanic membrane, ear canal and external ear normal.     Nose: Congestion and rhinorrhea present.     Mouth/Throat:     Mouth: Mucous membranes are moist.     Comments: Voice is hoarse Neck:     Musculoskeletal: Normal range of motion.  Cardiovascular:     Rate and Rhythm: Normal rate and regular rhythm.  Pulmonary:     Effort: Pulmonary effort is normal.     Breath sounds: Normal breath sounds.  Musculoskeletal: Normal range of motion.  Skin:    General: Skin is warm and dry.  Neurological:     Mental Status: She is alert and oriented to person, place, and time.  Psychiatric:        Mood and Affect: Mood normal.        Behavior: Behavior normal.    BP 132/73   Pulse 90   Temp 97.9 F (36.6 C) (Oral)   Ht 5\' 8"  (1.727 m)   Wt 204 lb 3.2 oz (92.6 kg)   BMI 31.05 kg/m         Assessment & Plan:  [name] in today with chief complaint of URI (since Aug, Avelox & Amox) and Cough   1. Upper respiratory infection with cough and congestion 1. Take meds as prescribed 2. Use a cool mist humidifier especially during the winter months and when heat has been humid. 3. Use saline nose sprays frequently 4. Saline irrigations of the nose can be very helpful if done frequently.  * 4X daily for 1 week*  * Use of a nettie pot can be helpful with this. Follow directions with this* 5. Drink plenty of fluids 6. Keep thermostat turn down low 7.For any cough or congestion  Use plain Mucinex- regular strength or max strength is fine   * Children- consult with Pharmacist for dosing 8. For fever or aces or pains- take tylenol or ibuprofen appropriate for age and weight.  * for fevers greater than 101 orally you may alternate ibuprofen and tylenol every  3 hours.    - doxycycline (VIBRA-TABS) 100 MG tablet; Take 1 tablet (100 mg total) by mouth 2 (two) times daily. 1 po bid  Dispense: 20 tablet; Refill: 0 - predniSONE (DELTASONE) 20 MG tablet; 2 po  at sametime daily for 5 days  Dispense: 10 tablet; Refill: 0  Mary-Margaret Daphine DeutscherMartin, FNP

## 2018-04-27 ENCOUNTER — Telehealth: Payer: Self-pay | Admitting: Family Medicine

## 2018-04-27 NOTE — Telephone Encounter (Signed)
Pt's husband advised of provider feedback and says he will tell the pt.

## 2018-04-27 NOTE — Telephone Encounter (Signed)
Pt states she started feeling better while she was on the prednisone and doxy but once she completed the prednisone it all came back. She is still taking the Mucinex and taking the cough syrup along with the saline rinses. She is saying she is coughing up "chunks of green mucus". Is there anything else we can try or does she ntbs again.

## 2018-04-27 NOTE — Telephone Encounter (Signed)
Will ntbs if needs another antibiotic. Other wise just needs to continue OTC cough meds

## 2018-05-15 DIAGNOSIS — E1169 Type 2 diabetes mellitus with other specified complication: Secondary | ICD-10-CM | POA: Diagnosis not present

## 2018-05-15 DIAGNOSIS — Z008 Encounter for other general examination: Secondary | ICD-10-CM | POA: Diagnosis not present

## 2018-05-15 DIAGNOSIS — E782 Mixed hyperlipidemia: Secondary | ICD-10-CM | POA: Diagnosis not present

## 2018-05-15 DIAGNOSIS — E039 Hypothyroidism, unspecified: Secondary | ICD-10-CM | POA: Diagnosis not present

## 2018-05-16 DIAGNOSIS — M5416 Radiculopathy, lumbar region: Secondary | ICD-10-CM | POA: Diagnosis not present

## 2018-05-16 DIAGNOSIS — M5136 Other intervertebral disc degeneration, lumbar region: Secondary | ICD-10-CM | POA: Diagnosis not present

## 2018-05-16 DIAGNOSIS — M48061 Spinal stenosis, lumbar region without neurogenic claudication: Secondary | ICD-10-CM | POA: Diagnosis not present

## 2018-05-16 DIAGNOSIS — M7989 Other specified soft tissue disorders: Secondary | ICD-10-CM | POA: Diagnosis not present

## 2018-05-29 ENCOUNTER — Ambulatory Visit (HOSPITAL_COMMUNITY)
Admission: RE | Admit: 2018-05-29 | Discharge: 2018-05-29 | Disposition: A | Discharge status: Home / Self Care | Payer: BLUE CROSS/BLUE SHIELD | Source: Ambulatory Visit | Attending: Family | Admitting: Family

## 2018-05-29 ENCOUNTER — Other Ambulatory Visit (HOSPITAL_COMMUNITY): Payer: Self-pay | Admitting: Neurosurgery

## 2018-05-29 DIAGNOSIS — R600 Localized edema: Secondary | ICD-10-CM

## 2018-06-25 ENCOUNTER — Encounter (HOSPITAL_BASED_OUTPATIENT_CLINIC_OR_DEPARTMENT_OTHER): Payer: Self-pay | Admitting: *Deleted

## 2018-06-25 ENCOUNTER — Emergency Department (HOSPITAL_BASED_OUTPATIENT_CLINIC_OR_DEPARTMENT_OTHER): Payer: BLUE CROSS/BLUE SHIELD

## 2018-06-25 ENCOUNTER — Emergency Department (HOSPITAL_BASED_OUTPATIENT_CLINIC_OR_DEPARTMENT_OTHER)
Admission: EM | Admit: 2018-06-25 | Discharge: 2018-06-25 | Disposition: A | Discharge status: Home / Self Care | Payer: BLUE CROSS/BLUE SHIELD | Attending: Emergency Medicine | Admitting: Emergency Medicine

## 2018-06-25 ENCOUNTER — Other Ambulatory Visit: Payer: Self-pay

## 2018-06-25 ENCOUNTER — Telehealth: Payer: Self-pay | Admitting: Family Medicine

## 2018-06-25 DIAGNOSIS — Z79899 Other long term (current) drug therapy: Secondary | ICD-10-CM | POA: Diagnosis not present

## 2018-06-25 DIAGNOSIS — E039 Hypothyroidism, unspecified: Secondary | ICD-10-CM | POA: Insufficient documentation

## 2018-06-25 DIAGNOSIS — M79661 Pain in right lower leg: Secondary | ICD-10-CM | POA: Diagnosis not present

## 2018-06-25 DIAGNOSIS — M25571 Pain in right ankle and joints of right foot: Secondary | ICD-10-CM | POA: Diagnosis not present

## 2018-06-25 DIAGNOSIS — Z7984 Long term (current) use of oral hypoglycemic drugs: Secondary | ICD-10-CM | POA: Insufficient documentation

## 2018-06-25 DIAGNOSIS — M25471 Effusion, right ankle: Secondary | ICD-10-CM | POA: Diagnosis not present

## 2018-06-25 DIAGNOSIS — E119 Type 2 diabetes mellitus without complications: Secondary | ICD-10-CM | POA: Insufficient documentation

## 2018-06-25 DIAGNOSIS — I10 Essential (primary) hypertension: Secondary | ICD-10-CM | POA: Diagnosis not present

## 2018-06-25 MED ORDER — NAPROXEN 500 MG PO TABS: 500.0000 mg | ORAL_TABLET | Freq: Two times a day (BID) | ORAL | 0 refills | Status: DC

## 2018-06-25 NOTE — ED Provider Notes (Signed)
[facility] EMERGENCY DEPARTMENT Provider Note   CSN: [id number] Arrival date & time: 2/24/[id number]  1029    History   Chief Complaint Chief Complaint  Patient presents with  . Ankle Pain    HPI [name] is a 68 y.o. female.     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  Patient states this weekend she drove home from [place] with minimal stops.  She states after getting home was when the symptoms started.  She noticed some redness of the right ankle today but has had a deep pain that started 2 days ago.  The swelling is gotten worse.  She is never had anything like this and denies prior injury to the area.  Her mother and aunts had issues with clotting but she has never been on anticoagulation or had DVT.  She also does not have a history of gout.  She denies any fever or recent medication changes.  The history is provided by the patient.  Ankle Pain  Location:  Ankle and foot Time since incident:  2 days Injury: no   Ankle location:  R ankle Foot location:  R foot Pain details:    Quality:  Aching, shooting and throbbing   Radiates to:  Does not radiate   Severity:  Moderate   Onset quality:  Gradual   Duration:  2 days   Timing:  Constant   Progression:  Worsening Chronicity:  New Dislocation: no   Prior injury to area:  No Relieved by:  None tried Worsened by:  Bearing weight Ineffective treatments:  None tried Associated symptoms: decreased ROM, stiffness and swelling   Associated symptoms: no back pain and no fever     Past Medical History:  Diagnosis Date  . Anxiety   . Diabetes mellitus    type II   . Dysrhythmia    tachycardia   . GERD (gastroesophageal reflux disease)   . Hyperlipidemia   . Hypertension   . Hypothyroidism   . Neuromuscular disorder (HCC)     muscle cramps     Patient Active Problem List   Diagnosis Date Noted  . Spinal stenosis of lumbar region 03/15/2017  . Spinal stenosis at L4-L5 level 03/15/2017  . Atrophic vaginitis 02/20/2016  . Hypothyroidism 09/10/2014  . GERD (gastroesophageal reflux disease) 09/10/2014  . Vitamin D deficiency 09/10/2014  . Diabetes mellitus, type 2 (HCC) 09/10/2014  . GAD (generalized anxiety disorder) 09/10/2014  . Obesity, unspecified 03/26/2013  . Mixed hyperlipidemia 03/26/2013  . Pulmonary nodule 09/22/2011  . Cough 09/22/2011  . Hypertension 09/22/2011    Past Surgical History:  Procedure Laterality Date  . APPENDECTOMY    . CARPAL TUNNEL RELEASE    . CHOLECYSTECTOMY    . KNEE ARTHROSCOPY     left  . LUMBAR LAMINECTOMY/DECOMPRESSION MICRODISCECTOMY N/A 03/15/2017   Procedure: Microlumbar decompression L4-5, L5-S1;  Surgeon: Jene EveryBeane, Jeffrey, MD;  Location: [facility];  Service: Orthopedics;  Laterality: N/A;  150 mins  . PARTIAL HYSTERECTOMY       OB History    Gravida  2   Para  2   Term  2   Preterm      AB      Living  2     SAB      TAB      Ectopic  Multiple      Live Births               Home Medications    Prior to Admission medications   Medication Sig Start Date End Date Taking? Authorizing Provider  ALPRAZolam Prudy Feeler) 0.5 MG tablet Take 1 tablet (0.5 mg total) by mouth at bedtime as needed for anxiety. 01/05/17   Dettinger, Elige Radon, MD  Biotin 5000 MCG TABS Take 10,000 mcg daily by mouth.     [provider]  Cholecalciferol (VITAMIN D PO) Take 1 tablet by mouth daily.    [provider]  conjugated estrogens (PREMARIN) vaginal cream Place 1 Applicatorful vaginally 3 (three) times a week. For vaginal thinning and irritation Patient taking differently: Place 1 Applicatorful vaginally daily as needed. For vaginal thinning and irritation 02/19/16   Tilda Burrow, MD  docusate sodium (COLACE) 100 MG capsule Take 1 capsule  (100 mg total) 2 (two) times daily as needed by mouth for mild constipation. 03/15/17   Jene Every, MD  doxycycline (VIBRA-TABS) 100 MG tablet Take 1 tablet (100 mg total) by mouth 2 (two) times daily. 1 po bid 04/16/18   Daphine Deutscher, Mary-Margaret, FNP  esomeprazole (NEXIUM) 40 MG capsule Take 40 mg by mouth daily before breakfast.    [provider]  Flaxseed, Linseed, (FLAX SEEDS PO) Take 1 tablet by mouth daily.    [provider]  gabapentin (NEURONTIN) 300 MG capsule 600 mg at night, 300 mg in am Patient taking differently: Take 300 mg by mouth 3 (three) times daily.  08/17/15   Elenora Gamma, MD  HYDROcodone-acetaminophen (NORCO/VICODIN) 5-325 MG tablet Take 1-2 tablets every 4 (four) hours as needed by mouth for severe pain. 03/15/17   Jene Every, MD  HYDROcodone-homatropine (HYCODAN) 5-1.5 MG/5ML syrup Take 5 mLs by mouth every 6 (six) hours as needed for cough. 04/16/18   Daphine Deutscher, Mary-Margaret, FNP  ibuprofen (ADVIL,MOTRIN) 200 MG tablet Take 1 tablet (200 mg total) every 8 (eight) hours as needed by mouth for mild pain. May resume 5 days post-op as needed 03/16/17   Dorothy Spark, PA-C  levothyroxine (SYNTHROID, LEVOTHROID) 88 MCG tablet Take 88 mcg by mouth every evening.     [provider]  lisinopril (PRINIVIL,ZESTRIL) 10 MG tablet Take 10 mg by mouth every morning.  10/03/16   [provider]  Meclizine HCl 25 MG CHEW Chew 1 tablet by mouth 3 (three) times daily.    [provider]  metFORMIN (GLUCOPHAGE-XR) 500 MG 24 hr tablet Take 1,000 mg 3 (three) times daily by mouth.     [provider]  methocarbamol (ROBAXIN) 750 MG tablet Take 1 tablet (750 mg total) by mouth every 6 (six) hours as needed for muscle spasms. 08/05/17   Gwyneth Sprout, MD  metoprolol tartrate (LOPRESSOR) 25 MG tablet Take 25 mg by mouth 2 (two) times daily.     [provider]  multivitamin-lutein (OCUVITE-LUTEIN) CAPS capsule Take 1  capsule by mouth daily.    [provider]  naproxen (NAPROSYN) 500 MG tablet Take 1 tablet (500 mg total) 2 (two) times daily with a meal by mouth. Resume 5 days post-op as needed 03/16/17   Dorothy Spark, PA-C  naproxen (NAPROSYN) 500 MG tablet TAKE  (1)  TABLET TWICE A DAY WITH MEALS (BREAKFAST AND SUPPER) 06/29/17   Dettinger, Elige Radon, MD  oxyCODONE-acetaminophen (PERCOCET/ROXICET) 5-325 MG tablet Take 1-2 tablets by mouth every 6 (six) hours as needed for severe pain.  08/05/17   Gwyneth Sprout, MD  Polyethyl Glycol-Propyl Glycol (SYSTANE OP) Apply 1 drop to eye 2 (two) times daily as needed (dry eyes).     [provider]  predniSONE (DELTASONE) 20 MG tablet 2 po at sametime daily for 5 days 04/16/18   Daphine Deutscher, Mary-Margaret, FNP  rosuvastatin (CRESTOR) 5 MG tablet Take 5 mg by mouth at bedtime.     [provider]  spironolactone (ALDACTONE) 25 MG tablet Take 12.5 mg by mouth daily.    [provider]    Family History Family History  Problem Relation Age of Onset  . Heart disease Mother   . Heart disease Father   . Colon cancer Father   . Brain cancer Father   . Lung cancer Maternal Uncle        smoked  . Lung cancer Maternal Uncle        smoked    Social History Social History   Tobacco Use  . Smoking status: Never Smoker  . Smokeless tobacco: Never Used  Substance Use Topics  . Alcohol use: No  . Drug use: No     Allergies   Dilaudid [hydromorphone hcl] and Hydromorphone   Review of Systems Review of Systems  Constitutional: Negative for fever.  HENT:       Problems with congestion, smelling unusual smells and facial pain since August.  She has had MRI of her face and head as well as seen ENT and taken a course of Avelox without improvement of her symptoms.  Musculoskeletal: Positive for stiffness. Negative for back pain.  All other systems reviewed and are negative.    Physical Exam Updated Vital Signs BP 129/75 (BP  Location: Right Arm)   Pulse 83   Temp 98.4 F (36.9 C) (Oral)   Resp 18   Ht  (1.753 m)   Wt 92.5 kg   SpO2 98%   BMI 30.13 kg/m   Physical Exam Vitals signs and nursing note reviewed.  Constitutional:      General: She is not in acute distress.    Appearance: She is well-developed.  HENT:     Head: Normocephalic and atraumatic.  Eyes:     Pupils: Pupils are equal, round, and reactive to light.  Cardiovascular:     Rate and Rhythm: Normal rate.  Pulmonary:     Effort: Pulmonary effort is normal. No respiratory distress.  Musculoskeletal:        General: Tenderness present. No swelling.     Right ankle: She exhibits decreased range of motion and swelling. She exhibits normal pulse. Tenderness. Lateral malleolus tenderness found.       Feet:     Comments: No edema  Skin:    General: Skin is warm and dry.     Findings: No rash.  Neurological:     Mental Status: She is alert and oriented to person, place, and time.     Cranial Nerves: No cranial nerve deficit.  Psychiatric:        Behavior: Behavior normal.      ED Treatments / Results  Labs (all labs ordered are listed, but only abnormal results are displayed) Labs Reviewed - No data to display  EKG None  Radiology US Venous Img Lower Right (dvt Study)  Result Date: 06/25/2018 CLINICAL DATA:  68 year old female with a history of right lower extremity pain and redness EXAM: RIGHT LOWER EXTREMITY VENOUS DOPPLER ULTRASOUND TECHNIQUE: Gray-scale sonography with graded compression, as well as color Doppler and  duplex ultrasound were performed to evaluate the lower extremity deep venous systems from the level of the common femoral vein and including the common femoral, femoral, profunda femoral, popliteal and calf veins including the posterior tibial, peroneal and gastrocnemius veins when visible. The superficial great saphenous vein was also interrogated. Spectral Doppler was utilized to evaluate flow at rest and  with distal augmentation maneuvers in the common femoral, femoral and popliteal veins. COMPARISON:  None. FINDINGS: Contralateral Common Femoral Vein: Respiratory phasicity is normal and symmetric with the symptomatic side. No evidence of thrombus. Normal compressibility. Common Femoral Vein: No evidence of thrombus. Normal compressibility, respiratory phasicity and response to augmentation. Saphenofemoral Junction: No evidence of thrombus. Normal compressibility and flow on color Doppler imaging. Profunda Femoral Vein: No evidence of thrombus. Normal compressibility and flow on color Doppler imaging. Femoral Vein: No evidence of thrombus. Normal compressibility, respiratory phasicity and response to augmentation. Popliteal Vein: No evidence of thrombus. Normal compressibility, respiratory phasicity and response to augmentation. Calf Veins: No evidence of thrombus. Normal compressibility and flow on color Doppler imaging. Superficial Great Saphenous Vein: No evidence of thrombus. Normal compressibility and flow on color Doppler imaging. Other Findings:  None. IMPRESSION: Sonographic survey of the right lower extremity negative for DVT Electronically Signed   By: Gilmer Mor D.O.   On: 06/25/2018 12:38    Procedures Procedures (including critical care time)  Medications Ordered in ED Medications - No data to display   Initial Impression / Assessment and Plan / ED Course  I have reviewed the triage vital signs and the nursing notes.  Pertinent labs & imaging results that were available during my care of the patient were reviewed by me and considered in my medical decision making (see chart for details).       Patient presenting with new right ankle and foot swelling after driving home from [place] this weekend.  Has a family history of her mom and aunts with blood clots but she herself has never had DVT.  She denies any chest pain or shortness of breath.  She is otherwise well-appearing.  Low  suspicion that this is related to cellulitis or septic arthritis.  Could be overuse with inflammation of the joint versus gout.  We will do Doppler to ensure no evidence of clot.  1:16 PM Imaging is negative.  Suspect it is most likely joint inflammation from recent trip.  Low suspicion for septic joint at this time.  Patient will follow-up with her orthopedist.  Final Clinical Impressions(s) / ED Diagnoses   Final diagnoses:  Acute right ankle pain  Effusion of right ankle    ED Discharge Orders    None       Gwyneth Sprout, MD 06/25/18 1317

## 2018-06-25 NOTE — Telephone Encounter (Signed)
Left message regarding appt.

## 2018-06-25 NOTE — ED Triage Notes (Signed)
Rt foot pain, redness and swelling x 1 week after a non stop 6 hour trip

## 2018-06-25 NOTE — Discharge Instructions (Signed)
Elevate and stay off of it when you can.  Wrap with ace bandage.  If you develop fever or any infectious symptoms follow-up with your orthopedist or return to the ER immediately.

## 2018-06-26 ENCOUNTER — Ambulatory Visit: Payer: BLUE CROSS/BLUE SHIELD | Admitting: Family Medicine

## 2018-07-11 DIAGNOSIS — E1169 Type 2 diabetes mellitus with other specified complication: Secondary | ICD-10-CM | POA: Diagnosis not present

## 2018-07-11 DIAGNOSIS — E039 Hypothyroidism, unspecified: Secondary | ICD-10-CM | POA: Diagnosis not present

## 2018-07-11 DIAGNOSIS — E782 Mixed hyperlipidemia: Secondary | ICD-10-CM | POA: Diagnosis not present

## 2018-07-11 DIAGNOSIS — Z139 Encounter for screening, unspecified: Secondary | ICD-10-CM | POA: Diagnosis not present

## 2018-07-11 DIAGNOSIS — Z79899 Other long term (current) drug therapy: Secondary | ICD-10-CM | POA: Diagnosis not present

## 2018-07-11 DIAGNOSIS — Z0130 Encounter for examination of blood pressure without abnormal findings: Secondary | ICD-10-CM | POA: Diagnosis not present

## 2018-07-25 DIAGNOSIS — E1169 Type 2 diabetes mellitus with other specified complication: Secondary | ICD-10-CM | POA: Diagnosis not present

## 2018-07-25 DIAGNOSIS — E782 Mixed hyperlipidemia: Secondary | ICD-10-CM | POA: Diagnosis not present

## 2018-07-25 DIAGNOSIS — E039 Hypothyroidism, unspecified: Secondary | ICD-10-CM | POA: Diagnosis not present

## 2018-07-25 DIAGNOSIS — Z7189 Other specified counseling: Secondary | ICD-10-CM | POA: Diagnosis not present

## 2018-08-13 DIAGNOSIS — M5416 Radiculopathy, lumbar region: Secondary | ICD-10-CM | POA: Diagnosis not present

## 2018-08-13 DIAGNOSIS — M48061 Spinal stenosis, lumbar region without neurogenic claudication: Secondary | ICD-10-CM | POA: Diagnosis not present

## 2018-08-13 DIAGNOSIS — M5136 Other intervertebral disc degeneration, lumbar region: Secondary | ICD-10-CM | POA: Diagnosis not present

## 2018-08-16 ENCOUNTER — Telehealth: Payer: Self-pay | Admitting: Obstetrics and Gynecology

## 2018-08-16 NOTE — Telephone Encounter (Signed)
Spoke with pt. Pt has a" burnt" area on her labia. This has been going on for 3 weeks. When urine touches the skin, it burns the area. Not having any urinary symptoms. Advised she needs to be seen. Pt voiced understanding. Call transferred to front desk for appt. JSY

## 2018-08-16 NOTE — Telephone Encounter (Signed)
Pt states that she has had a problem with her perineum for the last few weeks and there is a spot that looks "burnt." She states every time she urinates she is having a lot of pain. She is wanting to see if she can come in or if something can be sent in for her. She does not believe this is a UTI.

## 2018-08-20 ENCOUNTER — Telehealth: Payer: Self-pay | Admitting: *Deleted

## 2018-08-20 NOTE — Telephone Encounter (Signed)
Spoke with pt letting her know no visitors or children at tomorrow's appt. Also, pt hasn't come in contact with someone in the last month that has been confirmed or suspected of having Covid-19 nor is pt experiencing any symptoms herself. Pt plans on coming to appt. JSY

## 2018-08-21 ENCOUNTER — Ambulatory Visit: Payer: BLUE CROSS/BLUE SHIELD | Admitting: Adult Health

## 2018-08-21 ENCOUNTER — Encounter: Payer: Self-pay | Admitting: Adult Health

## 2018-08-21 ENCOUNTER — Other Ambulatory Visit: Payer: Self-pay

## 2018-08-21 VITALS — BP 125/76 | HR 65 | Ht 68.0 in | Wt 207.0 lb

## 2018-08-21 DIAGNOSIS — N898 Other specified noninflammatory disorders of vagina: Secondary | ICD-10-CM

## 2018-08-21 DIAGNOSIS — N952 Postmenopausal atrophic vaginitis: Secondary | ICD-10-CM | POA: Diagnosis not present

## 2018-08-21 MED ORDER — FLUCONAZOLE 100 MG PO TABS: ORAL_TABLET | ORAL | 0 refills | Status: DC

## 2018-08-21 NOTE — Progress Notes (Addendum)
  Subjective:     Patient ID: [name], female   DOB: 07-11-1950, 68 y.o.   MRN: [id number]  HPI [name] is a 68 year old white female, married, sp hysterectomy in complaining of vaginal irritation, itching , hurts when urine hits it, has been happening about 3 weeks now, Last A1c 7.2. She is Charity fundraiser at [employer].  PCP is Dr Louanne Skye.   Review of Systems +vaginal itching,about 3 weeks  +vaginal irritation Hurts when urine hits vagina  Reviewed past medical,surgical, social and family history. Reviewed medications and allergies.      Objective:   Physical Exam  BP 125/76 (BP Location: Left Arm, Patient Position: Sitting, Cuff Size: Normal)   Pulse 65   Ht 5\' 8"  (1.727 m)   Wt 207 lb (93.9 kg)   BMI 31.47 kg/m   Skin warm and dry. Pelvic: external genitalia: inner labia are red, and has excoriated area and areas of white plaque  And tender to touch, painted with gentian violet, vagina: is pale with loss of color,moisture and rugae,,urethra has no lesions or masses noted, cervix and uterus are absent, adnexa: no masses or tenderness noted. Bladder is non tender and no masses felt. Examination chaperoned by Marchelle Folks Rash LPN. Discussed may have several things going on, like, yeast, vaginal atrophy and lichen sclerosus.  Face time 15 minutes, with 50% counseling.      Assessment:     1. Vaginal irritation -painted with gentian violet Meds ordered this encounter  Medications  . fluconazole (DIFLUCAN) 100 MG tablet    Sig: Take 1 daily for 10 days    Dispense:  10 tablet    Refill:  0    Order Specific Question:   Supervising Provider    Answer:   Despina Hidden, LUTHER H [[id number]]   2. Vaginal itching  3. Vaginal atrophy     Plan:    Pat peri area, to leave gentian violet on Take diflucan 100 mg 1 daily for 10 days Follow up in 2 weeks, may add PVC and temovate then if not better.

## 2018-09-04 ENCOUNTER — Ambulatory Visit: Payer: BLUE CROSS/BLUE SHIELD | Admitting: Adult Health

## 2018-09-05 ENCOUNTER — Encounter: Payer: Self-pay | Admitting: *Deleted

## 2018-09-06 ENCOUNTER — Encounter: Payer: Self-pay | Admitting: Adult Health

## 2018-09-06 ENCOUNTER — Ambulatory Visit: Payer: BLUE CROSS/BLUE SHIELD | Admitting: Adult Health

## 2018-09-06 ENCOUNTER — Other Ambulatory Visit: Payer: Self-pay

## 2018-09-06 VITALS — BP 103/67 | HR 64 | Temp 98.4°F | Ht 68.0 in | Wt 208.0 lb

## 2018-09-06 DIAGNOSIS — N952 Postmenopausal atrophic vaginitis: Secondary | ICD-10-CM | POA: Diagnosis not present

## 2018-09-06 MED ORDER — ESTROGENS, CONJUGATED 0.625 MG/GM VA CREA: TOPICAL_CREAM | VAGINAL | 2 refills | Status: AC

## 2018-09-06 NOTE — Progress Notes (Signed)
Patient ID: [name], female   DOB: 11-Oct-1950, 68 y.o.   MRN: [id number] History of Present Illness:  [name] is a 68 year old white female, sp hysterectomy back in follow up after being treated for vaginal irritation, and itching, feels much better. She stopped the diflucan after 6 days.  PCP is Dr Louanne Skye.   Current Medications, Allergies, Past Medical History, Past Surgical History, Family History and Social History were reviewed in [employer] record.     Review of Systems: She says she feels much better, the itching and irritation stopped.    Physical Exam:BP 103/67 (BP Location: Right Arm, Patient Position: Sitting, Cuff Size: Normal)   Pulse 64   Temp 98.4 F (36.9 C)   Ht 5\' 8"  (1.727 m)   Wt 208 lb (94.3 kg)   BMI 31.63 kg/m  General:  Well developed, well nourished, no acute distress Skin:  Warm and dry Pelvic:  External genitalia is normal in appearance, no lesions, seen today, no excoriation or plaque.  The vagina is pale and atrophic. Urethra has no lesions or masses. The cervix and uterus are absent.  No adnexal masses or tenderness noted.Bladder is non tender, no masses felt. Psych:  No mood changes, alert and cooperative,seems happy Examination chaperoned by [name] CMA.  Fall risk is low.  Impression: 1. Vaginal atrophy Will add premarin vaginal cream for atrophy, gave 5 sample tubes and discount card  Meds ordered this encounter  Medications  . conjugated estrogens (PREMARIN) vaginal cream    Sig: Use 0.5 gm in vagina at hs for 2 weeks then 2-3 x weekly    Dispense:  42.5 g    Refill:  2    Order Specific Question:   Supervising Provider    Answer:   Duane Lope H [[id number]]  Follow up in 3 months or sooner if needed

## 2018-09-17 DIAGNOSIS — E1169 Type 2 diabetes mellitus with other specified complication: Secondary | ICD-10-CM | POA: Diagnosis not present

## 2018-09-26 DIAGNOSIS — M961 Postlaminectomy syndrome, not elsewhere classified: Secondary | ICD-10-CM | POA: Diagnosis not present

## 2018-09-26 DIAGNOSIS — M5416 Radiculopathy, lumbar region: Secondary | ICD-10-CM | POA: Diagnosis not present

## 2018-10-24 DIAGNOSIS — M961 Postlaminectomy syndrome, not elsewhere classified: Secondary | ICD-10-CM | POA: Diagnosis not present

## 2018-10-24 DIAGNOSIS — M5416 Radiculopathy, lumbar region: Secondary | ICD-10-CM | POA: Diagnosis not present

## 2018-11-05 DIAGNOSIS — E119 Type 2 diabetes mellitus without complications: Secondary | ICD-10-CM | POA: Diagnosis not present

## 2018-11-05 DIAGNOSIS — E1169 Type 2 diabetes mellitus with other specified complication: Secondary | ICD-10-CM | POA: Diagnosis not present

## 2018-11-05 DIAGNOSIS — E039 Hypothyroidism, unspecified: Secondary | ICD-10-CM | POA: Diagnosis not present

## 2018-11-05 DIAGNOSIS — Z719 Counseling, unspecified: Secondary | ICD-10-CM | POA: Diagnosis not present

## 2018-11-05 DIAGNOSIS — Z008 Encounter for other general examination: Secondary | ICD-10-CM | POA: Diagnosis not present

## 2018-11-21 DIAGNOSIS — E782 Mixed hyperlipidemia: Secondary | ICD-10-CM | POA: Diagnosis not present

## 2018-11-21 DIAGNOSIS — I10 Essential (primary) hypertension: Secondary | ICD-10-CM | POA: Diagnosis not present

## 2018-11-21 DIAGNOSIS — E1169 Type 2 diabetes mellitus with other specified complication: Secondary | ICD-10-CM | POA: Diagnosis not present

## 2018-11-21 DIAGNOSIS — Z79899 Other long term (current) drug therapy: Secondary | ICD-10-CM | POA: Diagnosis not present

## 2018-11-21 DIAGNOSIS — Z139 Encounter for screening, unspecified: Secondary | ICD-10-CM | POA: Diagnosis not present

## 2018-11-21 DIAGNOSIS — E039 Hypothyroidism, unspecified: Secondary | ICD-10-CM | POA: Diagnosis not present

## 2018-11-22 DIAGNOSIS — Z1231 Encounter for screening mammogram for malignant neoplasm of breast: Secondary | ICD-10-CM | POA: Diagnosis not present

## 2018-12-04 DIAGNOSIS — K219 Gastro-esophageal reflux disease without esophagitis: Secondary | ICD-10-CM | POA: Diagnosis not present

## 2018-12-04 DIAGNOSIS — R05 Cough: Secondary | ICD-10-CM | POA: Diagnosis not present

## 2018-12-04 DIAGNOSIS — J309 Allergic rhinitis, unspecified: Secondary | ICD-10-CM | POA: Diagnosis not present

## 2018-12-06 ENCOUNTER — Ambulatory Visit: Payer: BLUE CROSS/BLUE SHIELD | Admitting: Adult Health

## 2018-12-13 DIAGNOSIS — E039 Hypothyroidism, unspecified: Secondary | ICD-10-CM | POA: Diagnosis not present

## 2018-12-13 DIAGNOSIS — E782 Mixed hyperlipidemia: Secondary | ICD-10-CM | POA: Diagnosis not present

## 2018-12-13 DIAGNOSIS — E1169 Type 2 diabetes mellitus with other specified complication: Secondary | ICD-10-CM | POA: Diagnosis not present

## 2018-12-20 DIAGNOSIS — M79676 Pain in unspecified toe(s): Secondary | ICD-10-CM | POA: Diagnosis not present

## 2018-12-26 DIAGNOSIS — J343 Hypertrophy of nasal turbinates: Secondary | ICD-10-CM | POA: Diagnosis not present

## 2018-12-26 DIAGNOSIS — J310 Chronic rhinitis: Secondary | ICD-10-CM | POA: Diagnosis not present

## 2018-12-26 DIAGNOSIS — R42 Dizziness and giddiness: Secondary | ICD-10-CM | POA: Diagnosis not present

## 2018-12-26 DIAGNOSIS — R431 Parosmia: Secondary | ICD-10-CM | POA: Diagnosis not present

## 2018-12-26 DIAGNOSIS — H9042 Sensorineural hearing loss, unilateral, left ear, with unrestricted hearing on the contralateral side: Secondary | ICD-10-CM | POA: Diagnosis not present

## 2019-01-04 DIAGNOSIS — Z1211 Encounter for screening for malignant neoplasm of colon: Secondary | ICD-10-CM | POA: Diagnosis not present

## 2019-01-04 DIAGNOSIS — K640 First degree hemorrhoids: Secondary | ICD-10-CM | POA: Diagnosis not present

## 2019-01-04 DIAGNOSIS — K5730 Diverticulosis of large intestine without perforation or abscess without bleeding: Secondary | ICD-10-CM | POA: Diagnosis not present

## 2019-01-04 DIAGNOSIS — Z800 Family history of malignant neoplasm of digestive organs: Secondary | ICD-10-CM | POA: Diagnosis not present

## 2019-01-09 DIAGNOSIS — R42 Dizziness and giddiness: Secondary | ICD-10-CM | POA: Diagnosis not present

## 2019-01-09 DIAGNOSIS — H9042 Sensorineural hearing loss, unilateral, left ear, with unrestricted hearing on the contralateral side: Secondary | ICD-10-CM | POA: Diagnosis not present

## 2019-01-17 ENCOUNTER — Ambulatory Visit (INDEPENDENT_AMBULATORY_CARE_PROVIDER_SITE_OTHER): Payer: BC Managed Care – PPO | Admitting: Otolaryngology

## 2019-01-17 ENCOUNTER — Ambulatory Visit (INDEPENDENT_AMBULATORY_CARE_PROVIDER_SITE_OTHER): Payer: BLUE CROSS/BLUE SHIELD | Admitting: Otolaryngology

## 2019-01-17 DIAGNOSIS — R42 Dizziness and giddiness: Secondary | ICD-10-CM | POA: Diagnosis not present

## 2019-01-24 ENCOUNTER — Ambulatory Visit (INDEPENDENT_AMBULATORY_CARE_PROVIDER_SITE_OTHER): Payer: BC Managed Care – PPO | Admitting: Otolaryngology

## 2019-01-24 DIAGNOSIS — R42 Dizziness and giddiness: Secondary | ICD-10-CM | POA: Diagnosis not present

## 2019-02-13 DIAGNOSIS — Z23 Encounter for immunization: Secondary | ICD-10-CM | POA: Diagnosis not present

## 2019-03-13 DIAGNOSIS — E1169 Type 2 diabetes mellitus with other specified complication: Secondary | ICD-10-CM | POA: Diagnosis not present

## 2019-03-13 DIAGNOSIS — Z719 Counseling, unspecified: Secondary | ICD-10-CM | POA: Diagnosis not present

## 2019-03-13 DIAGNOSIS — E039 Hypothyroidism, unspecified: Secondary | ICD-10-CM | POA: Diagnosis not present

## 2019-03-13 DIAGNOSIS — Z008 Encounter for other general examination: Secondary | ICD-10-CM | POA: Diagnosis not present

## 2019-03-20 DIAGNOSIS — Z029 Encounter for administrative examinations, unspecified: Secondary | ICD-10-CM

## 2019-04-03 DIAGNOSIS — E1169 Type 2 diabetes mellitus with other specified complication: Secondary | ICD-10-CM | POA: Diagnosis not present

## 2019-04-03 DIAGNOSIS — I10 Essential (primary) hypertension: Secondary | ICD-10-CM | POA: Diagnosis not present

## 2019-04-03 DIAGNOSIS — E039 Hypothyroidism, unspecified: Secondary | ICD-10-CM | POA: Diagnosis not present

## 2019-04-03 DIAGNOSIS — Z79899 Other long term (current) drug therapy: Secondary | ICD-10-CM | POA: Diagnosis not present

## 2019-04-03 DIAGNOSIS — E782 Mixed hyperlipidemia: Secondary | ICD-10-CM | POA: Diagnosis not present

## 2019-04-08 DIAGNOSIS — E039 Hypothyroidism, unspecified: Secondary | ICD-10-CM | POA: Diagnosis not present

## 2019-04-08 DIAGNOSIS — E1169 Type 2 diabetes mellitus with other specified complication: Secondary | ICD-10-CM | POA: Diagnosis not present

## 2019-04-08 DIAGNOSIS — E782 Mixed hyperlipidemia: Secondary | ICD-10-CM | POA: Diagnosis not present

## 2019-04-10 DIAGNOSIS — R52 Pain, unspecified: Secondary | ICD-10-CM | POA: Diagnosis not present

## 2019-04-10 DIAGNOSIS — M65332 Trigger finger, left middle finger: Secondary | ICD-10-CM | POA: Diagnosis not present

## 2019-05-02 DIAGNOSIS — Z23 Encounter for immunization: Secondary | ICD-10-CM | POA: Diagnosis not present

## 2019-05-13 DIAGNOSIS — M5416 Radiculopathy, lumbar region: Secondary | ICD-10-CM | POA: Diagnosis not present

## 2019-05-13 DIAGNOSIS — M65332 Trigger finger, left middle finger: Secondary | ICD-10-CM | POA: Diagnosis not present

## 2019-05-13 DIAGNOSIS — M5136 Other intervertebral disc degeneration, lumbar region: Secondary | ICD-10-CM | POA: Diagnosis not present

## 2019-05-13 DIAGNOSIS — M48061 Spinal stenosis, lumbar region without neurogenic claudication: Secondary | ICD-10-CM | POA: Diagnosis not present

## 2019-05-13 DIAGNOSIS — M961 Postlaminectomy syndrome, not elsewhere classified: Secondary | ICD-10-CM | POA: Diagnosis not present

## 2019-05-14 DIAGNOSIS — I10 Essential (primary) hypertension: Secondary | ICD-10-CM | POA: Diagnosis not present

## 2019-05-29 DIAGNOSIS — M5416 Radiculopathy, lumbar region: Secondary | ICD-10-CM | POA: Diagnosis not present

## 2019-05-30 DIAGNOSIS — Z23 Encounter for immunization: Secondary | ICD-10-CM | POA: Diagnosis not present

## 2019-06-03 DIAGNOSIS — M48061 Spinal stenosis, lumbar region without neurogenic claudication: Secondary | ICD-10-CM | POA: Diagnosis not present

## 2019-06-03 DIAGNOSIS — M5416 Radiculopathy, lumbar region: Secondary | ICD-10-CM | POA: Diagnosis not present

## 2019-06-03 DIAGNOSIS — M8580 Other specified disorders of bone density and structure, unspecified site: Secondary | ICD-10-CM | POA: Diagnosis not present

## 2019-06-03 DIAGNOSIS — M5136 Other intervertebral disc degeneration, lumbar region: Secondary | ICD-10-CM | POA: Diagnosis not present

## 2019-07-03 IMAGING — MR MR BRAIN/IAC WO/W
11 of 12 series · 43 of 48 positions shown · IV contrast (multihance)
Comparison: None.

CLINICAL DATA: Left-sided hearing loss and vertigo for 1 year.

Creatinine was obtained on site at [HOSPITAL] at [HOSPITAL].
Results: Creatinine 0.7 mg/dL.
EXAM:
MRI HEAD WITHOUT AND WITH CONTRAST
TECHNIQUE: Multiplanar, multiecho pulse sequences of the brain and surrounding
structures were obtained without and with intravenous contrast.
CONTRAST:  19mL MULTIHANCE GADOBENATE DIMEGLUMINE 529 MG/ML IV SOLN

[Series 5: T1 · sagittal · 4.0mm · 0.72mm/px · 3 of 29 slices shown (1 of 3)]
[im 1/29]
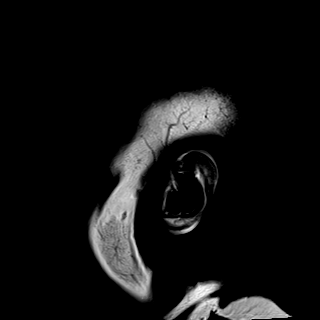
[im 15/29]
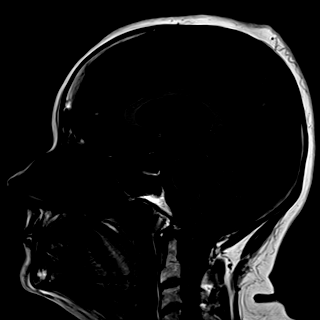
[im 29/29]
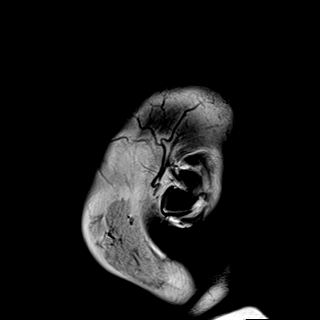

[Series 6: T2 · axial · 4.0mm · 0.36mm/px · z∈[-36,+112]mm · 3 of 30 slices shown]
[im 1/30]
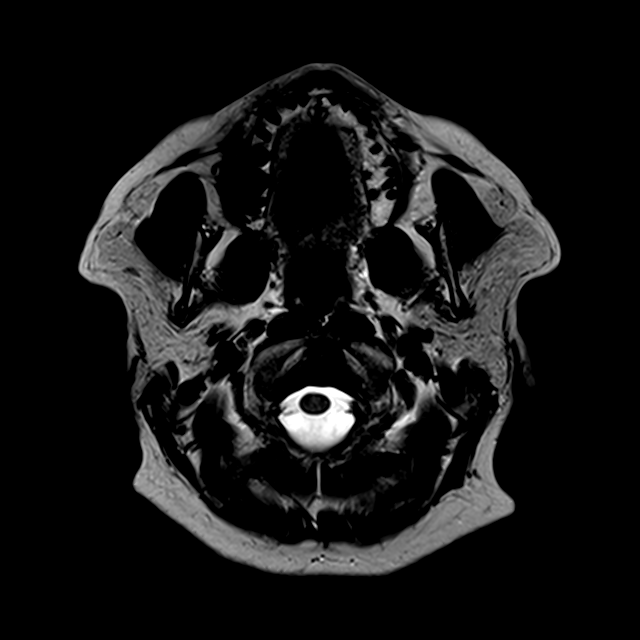
[im 15/30]
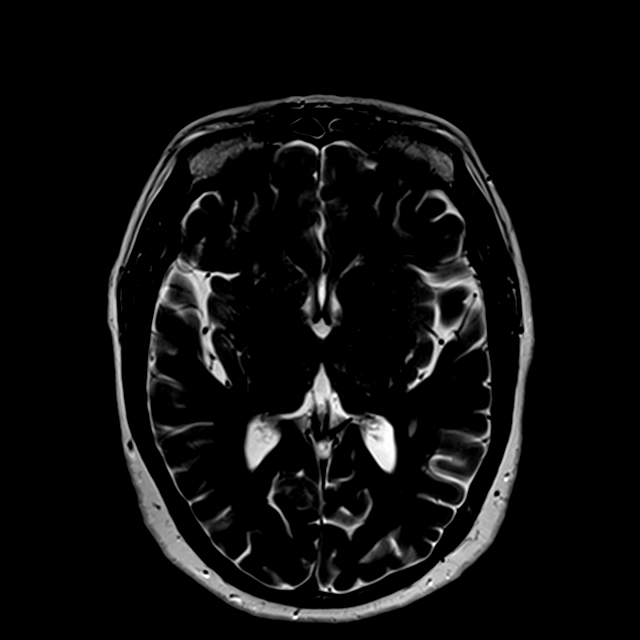
[im 30/30]
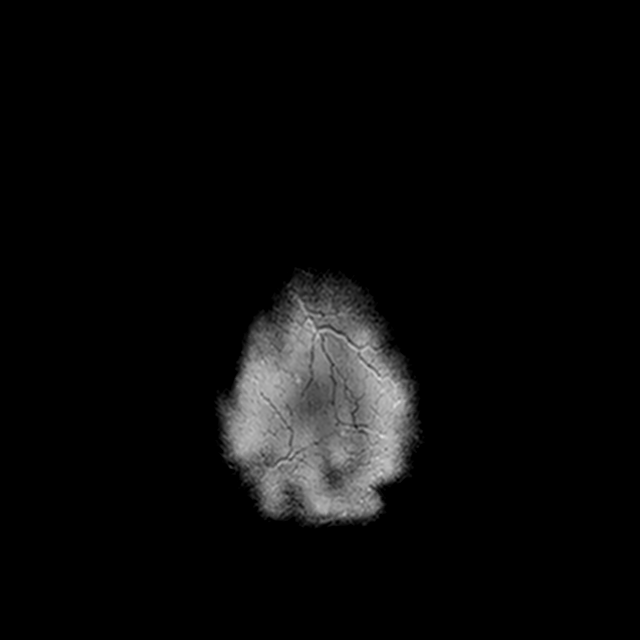

[Series 7: DWI · axial · 3.0mm · 1.44mm/px · z∈[-34,+111]mm · 7 of 78 slices shown (1 of 2)]
[im 1/78]
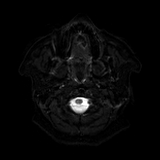
[im 13/78]
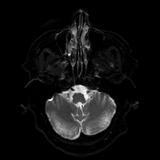
[im 26/78]
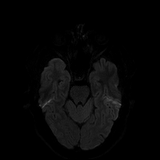
[im 39/78]
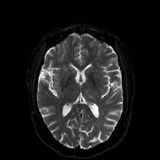
[im 52/78]
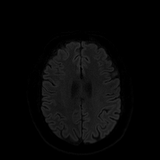
[im 65/78]
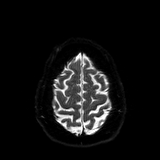
[im 78/78]
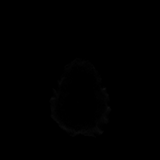

[Series 8: DWI · axial · 3.0mm · 1.44mm/px · z∈[-34,+111]mm · 3 of 39 slices shown (2 of 2)]
[im 1/39]
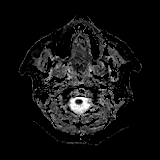
[im 20/39]
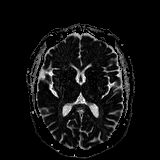
[im 39/39]
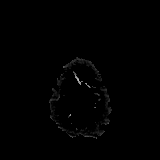

[Series 9: FLAIR · axial · 3.0mm · 0.72mm/px · z∈[-35,+112]mm · 2 of 26 slices shown]
[im 1/26]
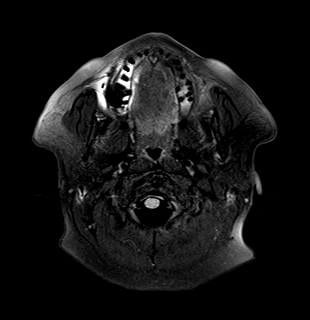
[im 26/26]
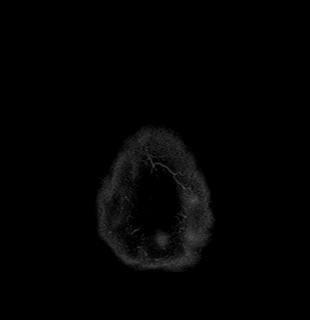

[Series 11: swi_images · axial · 1.5mm · 0.90mm/px · z∈[-33,+107]mm · 8 of 96 slices shown]
[im 1/96]
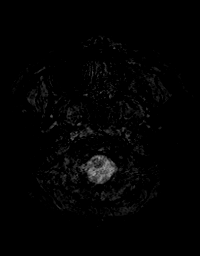
[im 14/96]
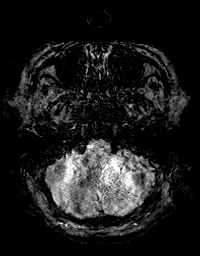
[im 28/96]
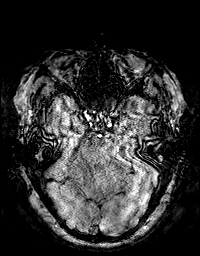
[im 41/96]
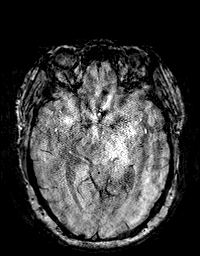
[im 55/96]
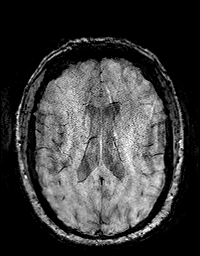
[im 68/96]
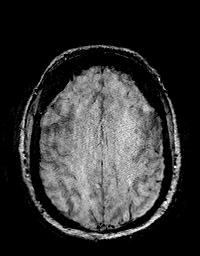
[im 82/96]
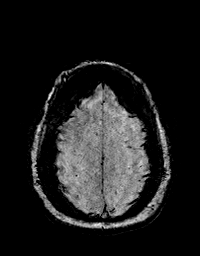
[im 96/96]
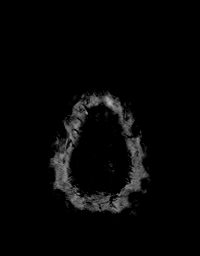

[Series 12: T1 · coronal · 2.5mm · 0.56mm/px · 1 of 13 slices shown (2 of 3)]
[im 1/13]
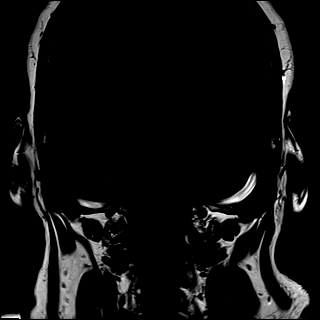

[Series 13: T1 · axial · 2.5mm · 0.50mm/px · 1 of 13 slices shown (3 of 3)]
[im 1/13]
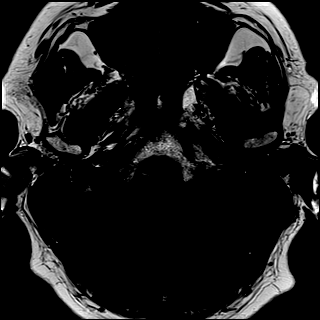

[Series 15: T1 post-contrast · coronal · 2.5mm · 0.56mm/px · 1 of 13 slices shown (1 of 3)]
[im 1/13]
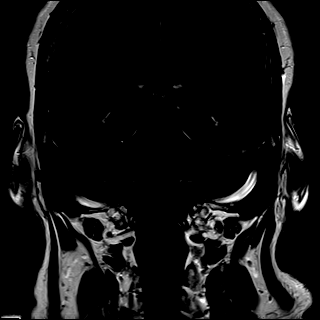

[Series 16: T1 post-contrast · axial · 2.5mm · 0.50mm/px · 1 of 13 slices shown (2 of 3)]
[im 1/13]
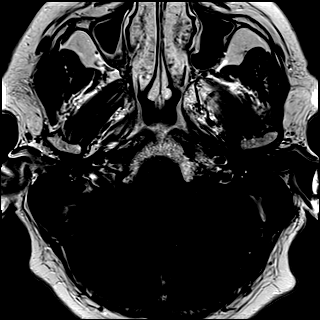

[Series 17: T1 post-contrast · axial · 1.0mm · 0.90mm/px · z∈[-41,+115]mm · 13 of 160 slices shown (3 of 3)]
[im 1/160]
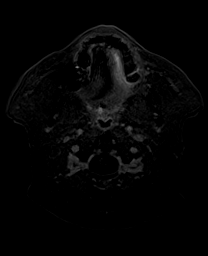
[im 14/160]
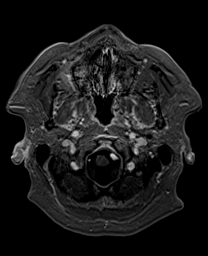
[im 27/160]
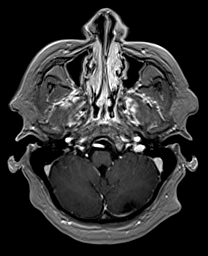
[im 40/160]
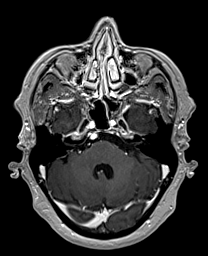
[im 54/160]
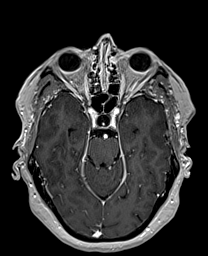
[im 67/160]
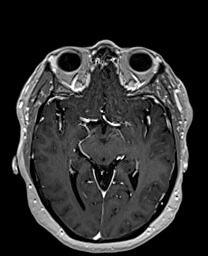
[im 80/160]
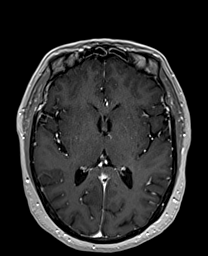
[im 93/160]
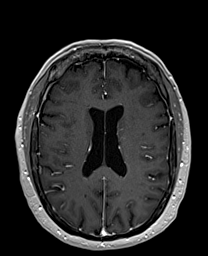
[im 107/160]
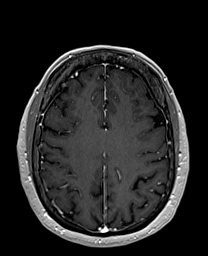
[im 120/160]
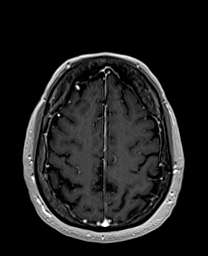
[im 133/160]
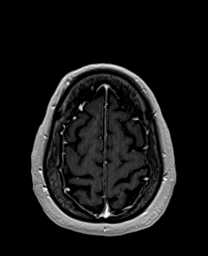
[im 146/160]
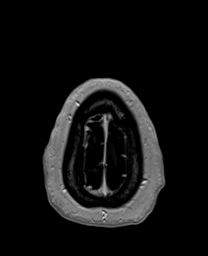
[im 160/160]
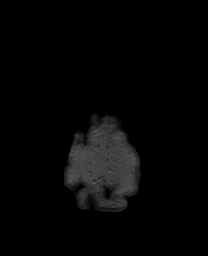

[43 of 48 positions shown; findings below may reference images not displayed]

FINDINGS: BRAIN: There is no acute infarct, acute hemorrhage, hydrocephalus or
extra-axial collection. The midline structures are normal. No
midline shift or other mass effect. There are no old infarcts. The
white matter signal is normal for the patient's age. The cerebral
and cerebellar volume are age-appropriate. Susceptibility-sensitive
sequences show no chronic microhemorrhage or superficial siderosis.

INTERNAL AUDITORY CANALS: There is no cerebellopontine angle mass.
The cochleae and semicircular canals are normal. No focal
abnormality along the course of the 7th and 8th cranial nerves.
Normal porus acusticus and vestibular aqueduct bilaterally.

VASCULAR: Major intracranial arterial and venous sinus flow voids
are normal.

SKULL AND UPPER CERVICAL SPINE: Calvarial bone marrow signal is
normal. There is no skull base mass. Visualized upper cervical spine
and soft tissues are normal.

SINUSES/ORBITS: No fluid levels or advanced mucosal thickening. No
mastoid or middle ear effusion. The orbits are normal.
IMPRESSION: Normal MRI of the brain, internal auditory canals, labyrinthine
structures and cranial nerves 7 and 8.

## 2019-07-08 ENCOUNTER — Encounter: Payer: Self-pay | Admitting: Family Medicine

## 2019-07-08 DIAGNOSIS — E1169 Type 2 diabetes mellitus with other specified complication: Secondary | ICD-10-CM | POA: Diagnosis not present

## 2019-07-08 DIAGNOSIS — Z139 Encounter for screening, unspecified: Secondary | ICD-10-CM | POA: Diagnosis not present

## 2019-07-08 DIAGNOSIS — E039 Hypothyroidism, unspecified: Secondary | ICD-10-CM | POA: Diagnosis not present

## 2019-07-08 DIAGNOSIS — E782 Mixed hyperlipidemia: Secondary | ICD-10-CM | POA: Diagnosis not present

## 2019-07-08 DIAGNOSIS — I10 Essential (primary) hypertension: Secondary | ICD-10-CM | POA: Diagnosis not present

## 2019-07-08 DIAGNOSIS — Z79899 Other long term (current) drug therapy: Secondary | ICD-10-CM | POA: Diagnosis not present

## 2019-07-19 DIAGNOSIS — H00019 Hordeolum externum unspecified eye, unspecified eyelid: Secondary | ICD-10-CM | POA: Diagnosis not present

## 2019-07-22 DIAGNOSIS — H00011 Hordeolum externum right upper eyelid: Secondary | ICD-10-CM | POA: Diagnosis not present

## 2019-10-28 DIAGNOSIS — Z008 Encounter for other general examination: Secondary | ICD-10-CM | POA: Diagnosis not present

## 2019-10-28 DIAGNOSIS — Z719 Counseling, unspecified: Secondary | ICD-10-CM | POA: Diagnosis not present

## 2019-10-28 DIAGNOSIS — E039 Hypothyroidism, unspecified: Secondary | ICD-10-CM | POA: Diagnosis not present

## 2019-10-28 DIAGNOSIS — E1169 Type 2 diabetes mellitus with other specified complication: Secondary | ICD-10-CM | POA: Diagnosis not present

## 2019-11-26 ENCOUNTER — Encounter: Payer: Self-pay | Admitting: Nurse Practitioner

## 2019-11-26 DIAGNOSIS — Z1231 Encounter for screening mammogram for malignant neoplasm of breast: Secondary | ICD-10-CM | POA: Diagnosis not present

## 2020-01-20 DIAGNOSIS — M8580 Other specified disorders of bone density and structure, unspecified site: Secondary | ICD-10-CM | POA: Diagnosis not present

## 2020-01-20 DIAGNOSIS — M48061 Spinal stenosis, lumbar region without neurogenic claudication: Secondary | ICD-10-CM | POA: Diagnosis not present

## 2020-01-20 DIAGNOSIS — S32021D Stable burst fracture of second lumbar vertebra, subsequent encounter for fracture with routine healing: Secondary | ICD-10-CM | POA: Diagnosis not present

## 2020-01-20 DIAGNOSIS — M5136 Other intervertebral disc degeneration, lumbar region: Secondary | ICD-10-CM | POA: Diagnosis not present

## 2020-02-10 DIAGNOSIS — Z23 Encounter for immunization: Secondary | ICD-10-CM | POA: Diagnosis not present

## 2020-02-12 DIAGNOSIS — E119 Type 2 diabetes mellitus without complications: Secondary | ICD-10-CM | POA: Diagnosis not present

## 2020-03-01 LAB — HM DIABETES EYE EXAM

## 2020-03-05 DIAGNOSIS — Z23 Encounter for immunization: Secondary | ICD-10-CM | POA: Diagnosis not present

## 2020-04-28 ENCOUNTER — Other Ambulatory Visit: Payer: Self-pay

## 2020-04-28 ENCOUNTER — Encounter: Payer: Self-pay | Admitting: Nurse Practitioner

## 2020-04-28 ENCOUNTER — Ambulatory Visit: Payer: BC Managed Care – PPO | Admitting: Nurse Practitioner

## 2020-04-28 VITALS — BP 133/77 | HR 63 | Temp 97.4°F | Ht 68.0 in | Wt 196.6 lb

## 2020-04-28 DIAGNOSIS — Z0289 Encounter for other administrative examinations: Secondary | ICD-10-CM | POA: Diagnosis not present

## 2020-04-28 DIAGNOSIS — F411 Generalized anxiety disorder: Secondary | ICD-10-CM

## 2020-04-28 DIAGNOSIS — I10 Essential (primary) hypertension: Secondary | ICD-10-CM | POA: Diagnosis not present

## 2020-04-28 DIAGNOSIS — K219 Gastro-esophageal reflux disease without esophagitis: Secondary | ICD-10-CM | POA: Diagnosis not present

## 2020-04-28 DIAGNOSIS — Z79891 Long term (current) use of opiate analgesic: Secondary | ICD-10-CM | POA: Diagnosis not present

## 2020-04-28 MED ORDER — ALPRAZOLAM 0.5 MG PO TABS: 0.5000 mg | ORAL_TABLET | Freq: Every evening | ORAL | 0 refills | Status: DC | PRN

## 2020-04-28 NOTE — Assessment & Plan Note (Signed)
Generalized anxiety not well controlled.  Patient is reporting increased life stressors with family dynamics.  Continue to provide education to patient with printed handouts given.  Continue to educate on stress management.  Medication refill sent to pharmacy.  Follow-up with worsening or unresolved symptoms.

## 2020-04-28 NOTE — Assessment & Plan Note (Signed)
GERD is well managed on current medication no changes necessary.  Continue healthy diet and exercise regimen as tolerated.

## 2020-04-28 NOTE — Patient Instructions (Signed)

## 2020-04-28 NOTE — Progress Notes (Signed)
Established Patient Office Visit  Subjective:  Patient ID: [name], female    DOB: 1950-09-22  Age: 69 y.o. MRN: [id number]  CC:  Chief Complaint  Patient presents with   Medication Refill    HPI [name] presents for follow up of hypertension. Patient was diagnosed in 09/22/2011. The patient is tolerating the medication well without side effects. Compliance with treatment has been good; including taking medication as directed , maintains a healthy diet and regular exercise regimen , and following up as directed.  Current medication lisinopril 10 mg tablet by mouth daily, Metroprolol 25 mg tablet twice daily.    Anxiety: Patient complains of anxiety disorder.  She has the following symptoms: Increased life stressors. Onset of symptoms was approximately 4 years ago, unchanged since that time. She denies current suicidal and homicidal ideation. Family history significant for no psychiatric illness.Possible organic causes contributing are: none. Risk factors: none Previous treatment includes Xanax .  She complains of the following side effects from the treatment: none.     Past Medical History:  Diagnosis Date   Anxiety    Diabetes mellitus    type II    Dysrhythmia    tachycardia    GERD (gastroesophageal reflux disease)    Hyperlipidemia    Hypertension    Hypothyroidism    Neuromuscular disorder (HCC)    muscle cramps     Past Surgical History:  Procedure Laterality Date   APPENDECTOMY     CARPAL TUNNEL RELEASE     CHOLECYSTECTOMY     KNEE ARTHROSCOPY     left   LUMBAR LAMINECTOMY/DECOMPRESSION MICRODISCECTOMY N/A 03/15/2017   Procedure: Microlumbar decompression L4-5, L5-S1;  Surgeon: Jene Every, MD;  Location: [facility];  Service: Orthopedics;  Laterality: N/A;  150 mins   PARTIAL HYSTERECTOMY      Family History  Problem Relation Age of Onset   Heart disease Mother    Heart disease Father    Colon cancer Father    Brain cancer  Father    Lung cancer Maternal Uncle        smoked   Lung cancer Maternal Uncle        smoked    Social History   Socioeconomic History   Marital status: Married    Spouse name: Not on file   Number of children: 2   Years of education: Not on file   Highest education level: Not on file  Occupational History   Occupation: Teacher, adult education: [employer]  Tobacco Use   Smoking status: Never Smoker   Smokeless tobacco: Never Used  Building services engineer Use: Never used  Substance and Sexual Activity   Alcohol use: No   Drug use: No   Sexual activity: Yes    Birth control/protection: Surgical    Comment: hyst  Other Topics Concern   Not on file  Social History Narrative   Not on file   Social Determinants of Health   Financial Resource Strain: Not on file  Food Insecurity: Not on file  Transportation Needs: Not on file  Physical Activity: Not on file  Stress: Not on file  Social Connections: Not on file  Intimate Partner Violence: Not on file    Outpatient Medications Prior to Visit  Medication Sig Dispense Refill   ALPRAZolam (XANAX) 0.5 MG tablet Take 1 tablet (0.5 mg total) by mouth at bedtime as needed for anxiety. 90 tablet 0   Biotin 5000 MCG TABS  Take 10,000 mcg daily by mouth.      Cholecalciferol (VITAMIN D PO) Take 1 tablet by mouth daily.     conjugated estrogens (PREMARIN) vaginal cream Use 0.5 gm in vagina at hs for 2 weeks then 2-3 x weekly 42.5 g 2   docusate sodium (COLACE) 100 MG capsule Take 1 capsule (100 mg total) 2 (two) times daily as needed by mouth for mild constipation. 30 capsule 1   esomeprazole (NEXIUM) 40 MG capsule Take 40 mg by mouth daily before breakfast.     Flaxseed, Linseed, (FLAX SEEDS PO) Take 1 tablet by mouth daily.     fluconazole (DIFLUCAN) 100 MG tablet Take 1 daily for 10 days 10 tablet 0   gabapentin (NEURONTIN) 300 MG capsule 600 mg at night, 300 mg in am (Patient taking differently: Take 300 mg by mouth 3  (three) times daily.) 90 capsule 3   levothyroxine (SYNTHROID, LEVOTHROID) 88 MCG tablet Take 88 mcg by mouth every evening.      lisinopril (PRINIVIL,ZESTRIL) 10 MG tablet Take 10 mg by mouth every morning.      Meclizine HCl 25 MG CHEW Chew 1 tablet by mouth 3 (three) times daily.     metFORMIN (GLUCOPHAGE-XR) 500 MG 24 hr tablet Take 1,000 mg 3 (three) times daily by mouth.      methocarbamol (ROBAXIN) 750 MG tablet Take 1 tablet (750 mg total) by mouth every 6 (six) hours as needed for muscle spasms. 30 tablet 0   metoprolol tartrate (LOPRESSOR) 25 MG tablet Take 25 mg by mouth 2 (two) times daily.      multivitamin-lutein (OCUVITE-LUTEIN) CAPS capsule Take 1 capsule by mouth daily.     naproxen (NAPROSYN) 500 MG tablet Take 1 tablet (500 mg total) by mouth 2 (two) times daily. 20 tablet 0   Polyethyl Glycol-Propyl Glycol (SYSTANE OP) Apply 1 drop to eye 2 (two) times daily as needed (dry eyes).      rosuvastatin (CRESTOR) 5 MG tablet Take 5 mg by mouth at bedtime.      spironolactone (ALDACTONE) 25 MG tablet Take 12.5 mg by mouth daily.     No facility-administered medications prior to visit.    Allergies  Allergen Reactions   Dilaudid [Hydromorphone Hcl] Nausea And Vomiting   Hydromorphone Nausea And Vomiting    ROS Review of Systems  Psychiatric/Behavioral: Negative for self-injury, sleep disturbance and suicidal ideas. The patient is nervous/anxious.        Positive for anxiety  All other systems reviewed and are negative.     Objective:    Physical Exam Vitals reviewed.  Constitutional:      Appearance: Normal appearance.  HENT:     Head: Normocephalic.  Eyes:     Conjunctiva/sclera: Conjunctivae normal.  Cardiovascular:     Rate and Rhythm: Normal rate and regular rhythm.     Pulses: Normal pulses.     Heart sounds: Normal heart sounds.  Pulmonary:     Effort: Pulmonary effort is normal.     Breath sounds: Normal breath sounds.  Abdominal:      General: Bowel sounds are normal.  Musculoskeletal:        General: No tenderness.  Skin:    General: Skin is warm.  Neurological:     Mental Status: She is alert and oriented to person, place, and time.  Psychiatric:     Comments: Anxiety     BP 133/77    Pulse 63    Temp (!) 97.4 F (  36.3 C)    Ht 5\' 8"  (1.727 m)    Wt 196 lb 9.6 oz (89.2 kg)    SpO2 96%    BMI 29.89 kg/m  Wt Readings from Last 3 Encounters:  09/06/18 208 lb (94.3 kg)  08/21/18 207 lb (93.9 kg)  06/25/18 204 lb (92.5 kg)     Health Maintenance Due  Topic Date Due   Hepatitis C Screening  Never done   FOOT EXAM  09/10/2015   PNA vac Low Risk Adult (1 of 2 - PCV13) Never done   HEMOGLOBIN A1C  09/07/2017   OPHTHALMOLOGY EXAM  07/12/2018     No results found for: TSH Lab Results  Component Value Date   WBC 10.2 08/05/2017   HGB 13.6 08/05/2017   HCT 41.6 08/05/2017   MCV 87.8 08/05/2017   PLT 266 08/05/2017   Lab Results  Component Value Date   NA 140 08/05/2017   K 4.0 08/05/2017   CO2 27 08/05/2017   GLUCOSE 142 (H) 08/05/2017   BUN 14 08/05/2017   CREATININE 0.68 08/05/2017   CALCIUM 9.4 08/05/2017   ANIONGAP 9 08/05/2017   Lab Results  Component Value Date   HGBA1C 7.3 (H) 03/10/2017       Assessment & Plan:  Hypertension Well-controlled on current medication no changes necessary.  Continue low-sodium healthy diet and exercise regimen as tolerated.   GAD (generalized anxiety disorder) Generalized anxiety not well controlled.  Patient is reporting increased life stressors with family dynamics.  Continue to provide education to patient with printed handouts given.  Continue to educate on stress management.  Medication refill sent to pharmacy.  Follow-up with worsening or unresolved symptoms.   GERD, Follow up:  The patient was last seen for GERD 4 years ago. Changes made since that visit include: none.  She reports good compliance with treatment. She is not having side  effects. 13/12/2016 .She is NOT experiencing choking on food, cough or dysphagia  -----------------------------------------------------------------------------------------    Problem List Items Addressed This Visit      Cardiovascular and Mediastinum   Hypertension - Primary    Well-controlled on current medication no changes necessary.  Continue low-sodium healthy diet and exercise regimen as tolerated.         Digestive   GERD (gastroesophageal reflux disease)    GERD is well managed on current medication no changes necessary.  Continue healthy diet and exercise regimen as tolerated.        Other   GAD (generalized anxiety disorder)    Generalized anxiety not well controlled.  Patient is reporting increased life stressors with family dynamics.  Continue to provide education to patient with printed handouts given.  Continue to educate on stress management.  Medication refill sent to pharmacy.  Follow-up with worsening or unresolved symptoms.         No orders of the defined types were placed in this encounter.   Follow-up: Return in about 6 months (around 10/27/2020), or if symptoms worsen or fail to improve.    10/29/2020, NP

## 2020-04-28 NOTE — Assessment & Plan Note (Signed)
Well-controlled on current medication no changes necessary.  Continue low-sodium healthy diet and exercise regimen as tolerated.

## 2020-04-28 NOTE — Addendum Note (Signed)
Addended by: Adella Hare B on: 04/28/2020 09:08 AM   Modules accepted: Orders

## 2020-05-05 LAB — TOXASSURE SELECT 13 (MW), URINE

## 2020-08-05 DIAGNOSIS — M961 Postlaminectomy syndrome, not elsewhere classified: Secondary | ICD-10-CM | POA: Diagnosis not present

## 2020-08-05 DIAGNOSIS — M48061 Spinal stenosis, lumbar region without neurogenic claudication: Secondary | ICD-10-CM | POA: Diagnosis not present

## 2020-08-05 DIAGNOSIS — M5416 Radiculopathy, lumbar region: Secondary | ICD-10-CM | POA: Diagnosis not present

## 2020-10-26 ENCOUNTER — Ambulatory Visit: Payer: BC Managed Care – PPO | Admitting: Family Medicine

## 2020-10-26 ENCOUNTER — Encounter: Payer: Self-pay | Admitting: Family Medicine

## 2020-10-26 ENCOUNTER — Other Ambulatory Visit: Payer: Self-pay

## 2020-10-26 VITALS — BP 108/76 | HR 68 | Wt 196.0 lb

## 2020-10-26 DIAGNOSIS — R6889 Other general symptoms and signs: Secondary | ICD-10-CM | POA: Diagnosis not present

## 2020-10-26 DIAGNOSIS — E119 Type 2 diabetes mellitus without complications: Secondary | ICD-10-CM

## 2020-10-26 DIAGNOSIS — Z23 Encounter for immunization: Secondary | ICD-10-CM | POA: Diagnosis not present

## 2020-10-26 DIAGNOSIS — I10 Essential (primary) hypertension: Secondary | ICD-10-CM | POA: Diagnosis not present

## 2020-10-26 DIAGNOSIS — E1165 Type 2 diabetes mellitus with hyperglycemia: Secondary | ICD-10-CM

## 2020-10-26 DIAGNOSIS — E782 Mixed hyperlipidemia: Secondary | ICD-10-CM

## 2020-10-26 DIAGNOSIS — F411 Generalized anxiety disorder: Secondary | ICD-10-CM

## 2020-10-26 DIAGNOSIS — E039 Hypothyroidism, unspecified: Secondary | ICD-10-CM | POA: Diagnosis not present

## 2020-10-26 NOTE — Progress Notes (Signed)
BP 108/76   Pulse 68   Wt 196 lb (88.9 kg)   SpO2 95%   BMI 29.80 kg/m    Subjective:   Patient ID: [name], female    DOB: 1950/11/30, 70 y.o.   MRN: [id number]  HPI: [name] is a 70 y.o. female presenting on 10/26/2020 for Medical Management of Chronic Issues, Hypothyroidism, and Diabetes   HPI Type 2 diabetes mellitus Patient comes in today for recheck of his diabetes. Patient has been currently taking metformin. Patient is currently on an ACE inhibitor/ARB. Patient has seen an ophthalmologist this year. Patient denies any new issues with their feet. The symptom started onset as an adult neuropathy, specifically right foot and hypothyroidism and hypertension and hyperlipidemia ARE RELATED TO DM   Hypothyroidism recheck Patient is coming in for thyroid recheck today as well. They deny any issues with hair changes or heat or cold problems or diarrhea or constipation. They deny any chest pain or palpitations. They are currently on levothyroxine 56mcrograms   Hypertension Patient is currently on lisinopril and metoprolol, and their blood pressure today is 108/76. Patient denies any lightheadedness or dizziness. Patient denies headaches, blurred vision, chest pains, shortness of breath, or weakness. Denies any side effects from medication and is content with current medication.   Hyperlipidemia Patient is coming in for recheck of his hyperlipidemia. The patient is currently taking Crestor. They deny any issues with myalgias or history of liver damage from it. They deny any focal numbness or weakness or chest pain.   Anxiety recheck. Patient is some anxiety from work and "such.  Relevant past medical, surgical, family and social history reviewed and updated as indicated. Interim medical history since our last visit reviewed. Allergies and medications reviewed and updated.  Review of Systems  Constitutional:  Negative for chills and fever.  HENT:  Negative for ear  discharge and ear pain.   Eyes:  Negative for visual disturbance.  Respiratory:  Negative for chest tightness and shortness of breath.   Cardiovascular:  Negative for chest pain and leg swelling.  Musculoskeletal:  Negative for back pain and gait problem.  Skin:  Negative for rash.  Neurological:  Negative for dizziness, light-headedness and headaches.  Psychiatric/Behavioral:  Negative for agitation and behavioral problems.   All other systems reviewed and are negative.  Per HPI unless specifically indicated above   Allergies as of 10/26/2020       Reactions   Dilaudid [hydromorphone Hcl] Nausea And Vomiting   Hydromorphone Nausea And Vomiting        Medication List        Accurate as of October 26, 2020  8:50 AM. If you have any questions, ask your nurse or doctor.          STOP taking these medications    FLAX SEEDS PO Stopped by: JFransisca KaufmannDettinger, MD   Meclizine HCl 25 MG Chew Stopped by: JFransisca KaufmannDettinger, MD       TAKE these medications    ALPRAZolam 0.5 MG tablet Commonly known as: Xanax Take 1 tablet (0.5 mg total) by mouth at bedtime as needed for anxiety.   Biotin 5000 MCG Tabs Take 10,000 mcg daily by mouth.   CALCIUM 1200 PO Take by mouth.   conjugated estrogens vaginal cream Commonly known as: Premarin Use 0.5 gm in vagina at hs for 2 weeks then 2-3 x weekly   docusate sodium 100 MG capsule Commonly known as: Colace Take 1 capsule (100  mg total) 2 (two) times daily as needed by mouth for mild constipation.   esomeprazole 40 MG capsule Commonly known as: NEXIUM Take 40 mg by mouth daily before breakfast.   fluconazole 100 MG tablet Commonly known as: DIFLUCAN Take 1 daily for 10 days   gabapentin 300 MG capsule Commonly known as: NEURONTIN 600 mg at night, 300 mg in am What changed:  how much to take how to take this when to take this additional instructions   levothyroxine 88 MCG tablet Commonly known as: SYNTHROID Take 88  mcg by mouth every evening.   lisinopril 10 MG tablet Commonly known as: ZESTRIL Take 10 mg by mouth every morning.   metFORMIN 500 MG 24 hr tablet Commonly known as: GLUCOPHAGE-XR Take 1,000 mg 3 (three) times daily by mouth.   methocarbamol 750 MG tablet Commonly known as: ROBAXIN Take 1 tablet (750 mg total) by mouth every 6 (six) hours as needed for muscle spasms.   metoprolol tartrate 25 MG tablet Commonly known as: LOPRESSOR Take 25 mg by mouth 2 (two) times daily.   multivitamin-lutein Caps capsule Take 1 capsule by mouth daily.   naproxen 500 MG tablet Commonly known as: NAPROSYN Take 1 tablet (500 mg total) by mouth 2 (two) times daily.   rosuvastatin 5 MG tablet Commonly known as: CRESTOR Take 5 mg by mouth at bedtime.   spironolactone 25 MG tablet Commonly known as: ALDACTONE Take 12.5 mg by mouth daily.   SYSTANE OP Apply 1 drop to eye 2 (two) times daily as needed (dry eyes).   vitamin B-12 500 MCG tablet Commonly known as: CYANOCOBALAMIN Take 500 mcg by mouth daily.   VITAMIN D PO Take 1 tablet by mouth daily.         Objective:   BP 108/76   Pulse 68   Wt 196 lb (88.9 kg)   SpO2 95%   BMI 29.80 kg/m   Wt Readings from Last 3 Encounters:  10/26/20 196 lb (88.9 kg)  04/28/20 196 lb 9.6 oz (89.2 kg)  09/06/18 208 lb (94.3 kg)    Physical Exam Vitals and nursing note reviewed.  Constitutional:      General: She is not in acute distress.    Appearance: She is well-developed. She is not diaphoretic.  Eyes:     Conjunctiva/sclera: Conjunctivae normal.  Cardiovascular:     Rate and Rhythm: Normal rate and regular rhythm.     Heart sounds: Normal heart sounds. No murmur heard. Pulmonary:     Effort: Pulmonary effort is normal. No respiratory distress.     Breath sounds: Normal breath sounds. No wheezing.  Musculoskeletal:        General: No tenderness. Normal range of motion.  Skin:    General: Skin is warm and dry.     Findings: No  rash.  Neurological:     Mental Status: She is alert and oriented to person, place, and time.     Coordination: Coordination normal.  Psychiatric:        Behavior: Behavior normal.    Results for orders placed or performed in visit on 05/08/20  HM DIABETES EYE EXAM  Result Value Ref Range   HM Diabetic Eye Exam No Retinopathy No Retinopathy    Assessment & Plan:   Problem List Items Addressed This Visit       Cardiovascular and Mediastinum   Hypertension - Primary   Relevant Orders   CBC with Differential/Platelet   CMP14+EGFR   Lipid panel  TSH   Bayer DCA Hb A1c Waived     Endocrine   Hypothyroidism   Relevant Orders   TSH   Diabetes mellitus, type 2 ([place])     Other   Mixed hyperlipidemia   GAD (generalized anxiety disorder)   Other Visit Diagnoses     Diabetes mellitus without complication ([place])       Relevant Orders   Bayer DCA Hb A1c Waived       Patient's A1c looks good at 6.7.  Continue current medication.  Follow-up in 6 months.  She has most of her exam at the lab at her work. Follow up plan: Return in about 6 months (around 04/27/2021), or if symptoms worsen or fail to improve, for Physical exam and hypertension and thyroid and diabetes.  Counseling provided for all of the vaccine components Orders Placed This Encounter  Procedures   CBC with Differential/Platelet   CMP14+EGFR   Lipid panel   TSH   Bayer DCA Hb A1c [place], MD [facility] 10/26/2020, 8:50 AM

## 2020-10-26 NOTE — Addendum Note (Signed)
Addended by: Dorene Sorrow on: 10/26/2020 09:05 AM   Modules accepted: Orders

## 2020-10-27 LAB — CBC WITH DIFFERENTIAL/PLATELET
Basophils Absolute: 0.1 10*3/uL (ref 0.0–0.2)
Basos: 1 %
EOS (ABSOLUTE): 0.4 10*3/uL (ref 0.0–0.4)
Eos: 5 %
Hematocrit: 43.9 % (ref 34.0–46.6)
Hemoglobin: 14.1 g/dL (ref 11.1–15.9)
Immature Grans (Abs): 0 10*3/uL (ref 0.0–0.1)
Immature Granulocytes: 0 %
Lymphocytes Absolute: 2.6 10*3/uL (ref 0.7–3.1)
Lymphs: 28 %
MCH: 28 pg (ref 26.6–33.0)
MCHC: 32.1 g/dL (ref 31.5–35.7)
MCV: 87 fL (ref 79–97)
Monocytes Absolute: 0.6 10*3/uL (ref 0.1–0.9)
Monocytes: 6 %
Neutrophils Absolute: 5.7 10*3/uL (ref 1.4–7.0)
Neutrophils: 60 %
Platelets: 257 10*3/uL (ref 150–450)
RBC: 5.04 x10E6/uL (ref 3.77–5.28)
RDW: 13.7 % (ref 11.7–15.4)
WBC: 9.4 10*3/uL (ref 3.4–10.8)

## 2020-10-27 LAB — LIPID PANEL
Chol/HDL Ratio: 3.6 ratio (ref 0.0–4.4)
Cholesterol, Total: 140 mg/dL (ref 100–199)
HDL: 39 mg/dL — ABNORMAL LOW (ref >39)
LDL Chol Calc (NIH): 65 mg/dL (ref 0–99)
Triglycerides: 217 mg/dL — ABNORMAL HIGH (ref 0–149)
VLDL Cholesterol Cal: 36 mg/dL (ref 5–40)

## 2020-10-27 LAB — CMP14+EGFR
ALT: 22 IU/L (ref 0–32)
AST: 27 IU/L (ref 0–40)
Albumin/Globulin Ratio: 2 (ref 1.2–2.2)
Albumin: 4.4 g/dL (ref 3.8–4.8)
Alkaline Phosphatase: 86 IU/L (ref 44–121)
BUN/Creatinine Ratio: 20 (ref 12–28)
BUN: 16 mg/dL (ref 8–27)
Bilirubin Total: 0.8 mg/dL (ref 0.0–1.2)
CO2: 24 mmol/L (ref 20–29)
Calcium: 10.1 mg/dL (ref 8.7–10.3)
Chloride: 97 mmol/L (ref 96–106)
Creatinine, Ser: 0.8 mg/dL (ref 0.57–1.00)
Globulin, Total: 2.2 g/dL (ref 1.5–4.5)
Glucose: 123 mg/dL — ABNORMAL HIGH (ref 65–99)
Potassium: 4.6 mmol/L (ref 3.5–5.2)
Sodium: 138 mmol/L (ref 134–144)
Total Protein: 6.6 g/dL (ref 6.0–8.5)
eGFR: 80 mL/min/{1.73_m2} (ref >59)

## 2020-10-27 LAB — TSH: TSH: 2.34 u[IU]/mL (ref 0.450–4.500)

## 2020-10-27 LAB — BAYER DCA HB A1C WAIVED: HB A1C (BAYER DCA - WAIVED): 6.7 % (ref <7.0)

## 2020-11-04 LAB — SPECIMEN STATUS REPORT

## 2020-11-04 LAB — B12 AND FOLATE PANEL
Folate: 10.8 ng/mL (ref >3.0)
Vitamin B-12: 1020 pg/mL (ref 232–1245)

## 2020-11-26 DIAGNOSIS — Z1231 Encounter for screening mammogram for malignant neoplasm of breast: Secondary | ICD-10-CM | POA: Diagnosis not present

## 2020-12-30 ENCOUNTER — Telehealth (HOSPITAL_COMMUNITY): Payer: Self-pay | Admitting: *Deleted

## 2020-12-30 NOTE — Telephone Encounter (Signed)
Opened in error

## 2021-01-18 ENCOUNTER — Emergency Department (HOSPITAL_COMMUNITY)
Admission: EM | Admit: 2021-01-18 | Discharge: 2021-01-18 | Discharge status: Against Medical Advice | Payer: BC Managed Care – PPO

## 2021-01-18 ENCOUNTER — Other Ambulatory Visit: Payer: Self-pay

## 2021-01-20 DIAGNOSIS — M48061 Spinal stenosis, lumbar region without neurogenic claudication: Secondary | ICD-10-CM | POA: Diagnosis not present

## 2021-01-20 DIAGNOSIS — M5136 Other intervertebral disc degeneration, lumbar region: Secondary | ICD-10-CM | POA: Diagnosis not present

## 2021-01-20 DIAGNOSIS — M5416 Radiculopathy, lumbar region: Secondary | ICD-10-CM | POA: Diagnosis not present

## 2021-01-20 DIAGNOSIS — M961 Postlaminectomy syndrome, not elsewhere classified: Secondary | ICD-10-CM | POA: Diagnosis not present

## 2021-01-28 DIAGNOSIS — M545 Low back pain, unspecified: Secondary | ICD-10-CM | POA: Diagnosis not present

## 2021-02-01 ENCOUNTER — Encounter: Payer: Self-pay | Admitting: *Deleted

## 2021-02-01 DIAGNOSIS — I10 Essential (primary) hypertension: Secondary | ICD-10-CM | POA: Diagnosis not present

## 2021-02-01 DIAGNOSIS — S32010A Wedge compression fracture of first lumbar vertebra, initial encounter for closed fracture: Secondary | ICD-10-CM | POA: Diagnosis not present

## 2021-02-01 DIAGNOSIS — M545 Low back pain, unspecified: Secondary | ICD-10-CM | POA: Diagnosis not present

## 2021-02-01 DIAGNOSIS — Z6829 Body mass index (BMI) 29.0-29.9, adult: Secondary | ICD-10-CM | POA: Diagnosis not present

## 2021-02-03 DIAGNOSIS — S32010A Wedge compression fracture of first lumbar vertebra, initial encounter for closed fracture: Secondary | ICD-10-CM | POA: Diagnosis not present

## 2021-02-15 DIAGNOSIS — E119 Type 2 diabetes mellitus without complications: Secondary | ICD-10-CM | POA: Diagnosis not present

## 2021-02-25 DIAGNOSIS — S32010D Wedge compression fracture of first lumbar vertebra, subsequent encounter for fracture with routine healing: Secondary | ICD-10-CM | POA: Diagnosis not present

## 2021-02-25 DIAGNOSIS — M5416 Radiculopathy, lumbar region: Secondary | ICD-10-CM | POA: Diagnosis not present

## 2021-03-15 DIAGNOSIS — M5416 Radiculopathy, lumbar region: Secondary | ICD-10-CM | POA: Diagnosis not present

## 2021-03-22 DIAGNOSIS — M5416 Radiculopathy, lumbar region: Secondary | ICD-10-CM | POA: Diagnosis not present

## 2021-04-01 DIAGNOSIS — M5416 Radiculopathy, lumbar region: Secondary | ICD-10-CM | POA: Diagnosis not present

## 2021-04-01 DIAGNOSIS — S32010D Wedge compression fracture of first lumbar vertebra, subsequent encounter for fracture with routine healing: Secondary | ICD-10-CM | POA: Diagnosis not present

## 2021-04-22 ENCOUNTER — Ambulatory Visit: Payer: BC Managed Care – PPO | Admitting: Family Medicine

## 2021-04-22 ENCOUNTER — Encounter: Payer: Self-pay | Admitting: Family Medicine

## 2021-04-22 ENCOUNTER — Ambulatory Visit (INDEPENDENT_AMBULATORY_CARE_PROVIDER_SITE_OTHER): Payer: BC Managed Care – PPO

## 2021-04-22 VITALS — BP 126/89 | HR 96 | Ht 68.0 in | Wt 188.0 lb

## 2021-04-22 DIAGNOSIS — M8588 Other specified disorders of bone density and structure, other site: Secondary | ICD-10-CM | POA: Insufficient documentation

## 2021-04-22 DIAGNOSIS — E1169 Type 2 diabetes mellitus with other specified complication: Secondary | ICD-10-CM

## 2021-04-22 DIAGNOSIS — E559 Vitamin D deficiency, unspecified: Secondary | ICD-10-CM

## 2021-04-22 DIAGNOSIS — M85851 Other specified disorders of bone density and structure, right thigh: Secondary | ICD-10-CM | POA: Diagnosis not present

## 2021-04-22 DIAGNOSIS — I10 Essential (primary) hypertension: Secondary | ICD-10-CM

## 2021-04-22 DIAGNOSIS — E782 Mixed hyperlipidemia: Secondary | ICD-10-CM | POA: Diagnosis not present

## 2021-04-22 DIAGNOSIS — E039 Hypothyroidism, unspecified: Secondary | ICD-10-CM | POA: Diagnosis not present

## 2021-04-22 DIAGNOSIS — Z780 Asymptomatic menopausal state: Secondary | ICD-10-CM | POA: Diagnosis not present

## 2021-04-22 DIAGNOSIS — E119 Type 2 diabetes mellitus without complications: Secondary | ICD-10-CM | POA: Diagnosis not present

## 2021-04-22 LAB — BAYER DCA HB A1C WAIVED: HB A1C (BAYER DCA - WAIVED): 6.8 % — ABNORMAL HIGH (ref 4.8–5.6)

## 2021-04-22 NOTE — Progress Notes (Signed)
BP 126/89    Pulse 96    Ht '5\' 8"'  (1.727 m)    Wt 188 lb (85.3 kg)    SpO2 96%    BMI 28.59 kg/m    Subjective:   Patient ID: [name], female    DOB: 08-01-1950, 70 y.o.   MRN: [id number]  HPI: [name] is a 70 y.o. female presenting on 04/22/2021 for Medical Management of Chronic Issues, Hypertension, Diabetes, Hypothyroidism, and Hyperlipidemia   HPI Type 2 diabetes mellitus Patient comes in today for recheck of his diabetes. Patient has been currently taking metformin. Patient is currently on an ACE inhibitor/ARB. Patient has not seen an ophthalmologist this year. Patient denies any issues with their feet. The symptom started onset as an adult hypothyroidism and hyperlipidemia and hypertension ARE RELATED TO DM   Hypothyroidism recheck Patient is coming in for thyroid recheck today as well. They deny any issues with hair changes or heat or cold problems or diarrhea or constipation. They deny any chest pain or palpitations. They are currently on levothyroxine 88 micrograms   Hyperlipidemia Patient is coming in for recheck of his hyperlipidemia. The patient is currently taking Crestor. They deny any issues with myalgias or history of liver damage from it. They deny any focal numbness or weakness or chest pain.   Hypertension Patient is currently on metoprolol and lisinopril, and their blood pressure today is 126/89. Patient denies any lightheadedness or dizziness. Patient denies headaches, blurred vision, chest pains, shortness of breath, or weakness. Denies any side effects from medication and is content with current medication.   Patient has osteopenia in the past with vertebral issues, will do bone scan, explained 3 years  Relevant past medical, surgical, family and social history reviewed and updated as indicated. Interim medical history since our last visit reviewed. Allergies and medications reviewed and updated.  Review of Systems  Constitutional:  Negative for chills  and fever.  Eyes:  Negative for visual disturbance.  Respiratory:  Negative for chest tightness and shortness of breath.   Cardiovascular:  Negative for chest pain and leg swelling.  Musculoskeletal:  Positive for arthralgias, back pain and myalgias. Negative for gait problem.  Skin:  Negative for rash.  Neurological:  Negative for light-headedness and headaches.  Psychiatric/Behavioral:  Negative for agitation and behavioral problems.   All other systems reviewed and are negative.  Per HPI unless specifically indicated above   Allergies as of 04/22/2021       Reactions   Dilaudid [hydromorphone Hcl] Nausea And Vomiting   Hydromorphone Nausea And Vomiting        Medication List        Accurate as of April 22, 2021  8:29 AM. If you have any questions, ask your nurse or doctor.          STOP taking these medications    fluconazole 100 MG tablet Commonly known as: DIFLUCAN Stopped by: Fransisca Kaufmann Keaghan Staton, MD   naproxen 500 MG tablet Commonly known as: NAPROSYN Stopped by: Fransisca Kaufmann Shamyah Stantz, MD       TAKE these medications    ALPRAZolam 0.5 MG tablet Commonly known as: Xanax Take 1 tablet (0.5 mg total) by mouth at bedtime as needed for anxiety.   Biotin 5000 MCG Tabs Take 10,000 mcg daily by mouth.   CALCIUM 1200 PO Take by mouth.   conjugated estrogens 0.625 MG/GM vaginal cream Commonly known as: Premarin Use 0.5 gm in vagina at hs for 2 weeks then  2-3 x weekly   docusate sodium 100 MG capsule Commonly known as: Colace Take 1 capsule (100 mg total) 2 (two) times daily as needed by mouth for mild constipation.   esomeprazole 40 MG capsule Commonly known as: NEXIUM Take 40 mg by mouth daily before breakfast.   gabapentin 300 MG capsule Commonly known as: NEURONTIN 600 mg at night, 300 mg in am What changed:  how much to take how to take this when to take this additional instructions   levothyroxine 88 MCG tablet Commonly known as:  SYNTHROID Take 88 mcg by mouth every evening.   lisinopril 10 MG tablet Commonly known as: ZESTRIL Take 10 mg by mouth every morning.   metFORMIN 500 MG 24 hr tablet Commonly known as: GLUCOPHAGE-XR Take 1,000 mg 3 (three) times daily by mouth.   methocarbamol 750 MG tablet Commonly known as: ROBAXIN Take 1 tablet (750 mg total) by mouth every 6 (six) hours as needed for muscle spasms.   metoprolol tartrate 25 MG tablet Commonly known as: LOPRESSOR Take 25 mg by mouth 2 (two) times daily.   multivitamin-lutein Caps capsule Take 1 capsule by mouth daily.   rosuvastatin 5 MG tablet Commonly known as: CRESTOR Take 5 mg by mouth at bedtime.   spironolactone 25 MG tablet Commonly known as: ALDACTONE Take 12.5 mg by mouth daily.   SYSTANE OP Apply 1 drop to eye 2 (two) times daily as needed (dry eyes).   vitamin B-12 500 MCG tablet Commonly known as: CYANOCOBALAMIN Take 500 mcg by mouth daily.   VITAMIN D PO Take 1 tablet by mouth daily.         Objective:   BP 126/89    Pulse 96    Ht '5\' 8"'  (1.727 m)    Wt 188 lb (85.3 kg)    SpO2 96%    BMI 28.59 kg/m   Wt Readings from Last 3 Encounters:  04/22/21 188 lb (85.3 kg)  10/26/20 196 lb (88.9 kg)  04/28/20 196 lb 9.6 oz (89.2 kg)    Physical Exam Vitals and nursing note reviewed.  Constitutional:      General: She is not in acute distress.    Appearance: She is well-developed. She is not diaphoretic.  Eyes:     Conjunctiva/sclera: Conjunctivae normal.  Cardiovascular:     Rate and Rhythm: Normal rate and regular rhythm.     Heart sounds: Normal heart sounds. No murmur heard. Pulmonary:     Effort: Pulmonary effort is normal. No respiratory distress.     Breath sounds: Normal breath sounds. No wheezing.  Musculoskeletal:        General: No swelling. Normal range of motion.  Skin:    General: Skin is warm and dry.     Findings: No rash.  Neurological:     Mental Status: She is alert and oriented to  person, place, and time.     Coordination: Coordination normal.  Psychiatric:        Behavior: Behavior normal.      Assessment & Plan:   Problem List Items Addressed This Visit       Cardiovascular and Mediastinum   Hypertension - Primary   Relevant Orders   CBC with Differential/Platelet   CMP14+EGFR   Lipid panel   TSH   Bayer DCA Hb A1c Waived     Endocrine   Hypothyroidism   Relevant Orders   CBC with Differential/Platelet   CMP14+EGFR   Lipid panel   TSH  Bayer DCA Hb A1c Waived   Diabetes mellitus, type 2 ([place])   Relevant Orders   CBC with Differential/Platelet   CMP14+EGFR   Lipid panel   TSH   Bayer DCA Hb A1c Waived     Musculoskeletal and Integument   Osteopenia of lumbar spine   Relevant Orders   VITAMIN D 25 Hydroxy (Vit-D Deficiency, Fractures)     Other   Vitamin D deficiency   Relevant Orders   VITAMIN D 25 Hydroxy (Vit-D Deficiency, Fractures)   DG [facility] DEXA   Mixed hyperlipidemia   Relevant Orders   CBC with Differential/Platelet   CMP14+EGFR   Lipid panel   TSH   Bayer DCA Hb A1c Waived    A1c looks good at 6.8, seems to be doing well.  We will do bone density today and then follow-up in 2 to 3 weeks on bone scan and management for back pain Follow up plan: Return if symptoms worsen or fail to improve, for Anywhere after 2 weeks to a month or 2, osteoporosis and pain.  Counseling provided for all of the vaccine components Orders Placed This Encounter  Procedures   DG [facility] DEXA   CBC with Differential/Platelet   CMP14+EGFR   Lipid panel   TSH   Bayer DCA Hb A1c Waived   VITAMIN D 25 Hydroxy (Vit-D Deficiency, Fractures)    Caryl Pina, MD [facility] 04/22/2021, 8:29 AM

## 2021-04-23 LAB — CBC WITH DIFFERENTIAL/PLATELET
Basophils Absolute: 0 10*3/uL (ref 0.0–0.2)
Basos: 1 %
EOS (ABSOLUTE): 0.3 10*3/uL (ref 0.0–0.4)
Eos: 4 %
Hematocrit: 41.6 % (ref 34.0–46.6)
Hemoglobin: 13.5 g/dL (ref 11.1–15.9)
Immature Grans (Abs): 0 10*3/uL (ref 0.0–0.1)
Immature Granulocytes: 1 %
Lymphocytes Absolute: 1.8 10*3/uL (ref 0.7–3.1)
Lymphs: 25 %
MCH: 27.5 pg (ref 26.6–33.0)
MCHC: 32.5 g/dL (ref 31.5–35.7)
MCV: 85 fL (ref 79–97)
Monocytes Absolute: 0.5 10*3/uL (ref 0.1–0.9)
Monocytes: 7 %
Neutrophils Absolute: 4.7 10*3/uL (ref 1.4–7.0)
Neutrophils: 62 %
Platelets: 314 10*3/uL (ref 150–450)
RBC: 4.91 x10E6/uL (ref 3.77–5.28)
RDW: 13.5 % (ref 11.7–15.4)
WBC: 7.4 10*3/uL (ref 3.4–10.8)

## 2021-04-23 LAB — CMP14+EGFR
ALT: 17 IU/L (ref 0–32)
AST: 23 IU/L (ref 0–40)
Albumin/Globulin Ratio: 2 (ref 1.2–2.2)
Albumin: 4.3 g/dL (ref 3.8–4.8)
Alkaline Phosphatase: 81 IU/L (ref 44–121)
BUN/Creatinine Ratio: 21 (ref 12–28)
BUN: 14 mg/dL (ref 8–27)
Bilirubin Total: 0.6 mg/dL (ref 0.0–1.2)
CO2: 26 mmol/L (ref 20–29)
Calcium: 9.7 mg/dL (ref 8.7–10.3)
Chloride: 99 mmol/L (ref 96–106)
Creatinine, Ser: 0.67 mg/dL (ref 0.57–1.00)
Globulin, Total: 2.1 g/dL (ref 1.5–4.5)
Glucose: 125 mg/dL — ABNORMAL HIGH (ref 70–99)
Potassium: 4.5 mmol/L (ref 3.5–5.2)
Sodium: 139 mmol/L (ref 134–144)
Total Protein: 6.4 g/dL (ref 6.0–8.5)
eGFR: 95 mL/min/{1.73_m2} (ref >59)

## 2021-04-23 LAB — LIPID PANEL
Chol/HDL Ratio: 2.8 ratio (ref 0.0–4.4)
Cholesterol, Total: 116 mg/dL (ref 100–199)
HDL: 41 mg/dL (ref >39)
LDL Chol Calc (NIH): 57 mg/dL (ref 0–99)
Triglycerides: 94 mg/dL (ref 0–149)
VLDL Cholesterol Cal: 18 mg/dL (ref 5–40)

## 2021-04-23 LAB — TSH: TSH: 1.74 u[IU]/mL (ref 0.450–4.500)

## 2021-04-28 ENCOUNTER — Ambulatory Visit: Payer: Medicare Other | Admitting: Family Medicine

## 2021-05-31 ENCOUNTER — Encounter: Payer: Self-pay | Admitting: Family Medicine

## 2021-05-31 ENCOUNTER — Ambulatory Visit: Payer: BC Managed Care – PPO | Admitting: Family Medicine

## 2021-05-31 VITALS — BP 138/67 | HR 69 | Ht 68.0 in | Wt 191.0 lb

## 2021-05-31 DIAGNOSIS — M48061 Spinal stenosis, lumbar region without neurogenic claudication: Secondary | ICD-10-CM | POA: Diagnosis not present

## 2021-05-31 DIAGNOSIS — M85851 Other specified disorders of bone density and structure, right thigh: Secondary | ICD-10-CM

## 2021-05-31 DIAGNOSIS — Z79899 Other long term (current) drug therapy: Secondary | ICD-10-CM | POA: Diagnosis not present

## 2021-05-31 DIAGNOSIS — Z79891 Long term (current) use of opiate analgesic: Secondary | ICD-10-CM | POA: Diagnosis not present

## 2021-05-31 MED ORDER — TRAMADOL HCL 50 MG PO TABS: 50.0000 mg | ORAL_TABLET | Freq: Every evening | ORAL | 2 refills | Status: DC | PRN

## 2021-05-31 MED ORDER — GABAPENTIN 300 MG PO CAPS: 300.0000 mg | ORAL_CAPSULE | Freq: Every day | ORAL | 3 refills | Status: DC

## 2021-05-31 MED ORDER — METHOCARBAMOL 750 MG PO TABS: 750.0000 mg | ORAL_TABLET | Freq: Every evening | ORAL | 3 refills | Status: DC | PRN

## 2021-05-31 NOTE — Progress Notes (Signed)
BP 138/67    Pulse 69    Ht 5\' 8"  (1.727 m)    Wt 191 lb (86.6 kg)    SpO2 95%    BMI 29.04 kg/m    Subjective:   Patient ID: , female    DOB: 08-07-1950, 71 y.o.   MRN: [id number]  HPI: [name] is a 71 y.o. female presenting on 05/31/2021 for Back Pain and Dexa Results   HPI Back pain Patient is coming in with continued back pain and its in the lower lumbar midline.  She has had injections and it does mildly improve it.  She says currently she is taking the methocarbamol and gabapentin and tramadol and they do seem to be helping.   Pain assessment: Cause of pain-spinal stenosis of lower back Pain location-midline lower back shoots down right leg Pain on scale of 1-10- 5 Frequency-Daily What increases pain-being on her feet and working throughout the day, worsens What makes pain Better-rest and tramadol and Robaxin Effects on ADL -minimal limitations, mainly affects her sleep. Any change in general medical condition-none  Current opioids rx-tramadol 50 mg nightly as needed # meds rx-30 Effectiveness of current meds-works well Adverse reactions from pain meds-none Morphine equivalent-5  Pill count performed-No Last drug screen -N/A, new to 06/02/2021 ( high risk q41m, moderate risk q57m, low risk yearly ) Urine drug screen today- Yes Was the [facility] reviewed-yes  If yes were their any concerning findings? -None   Overdose risk: 100 No flowsheet data found.   Pain contract signed on: Today  Osteoporosis/osteopenia Fractures or history of fracture: Vertebral compression fracture Medication: Calcium and vitamin D Duration of treatment: 2 years Last bone density scan: 04/22/2021 Last T score: -2.2   Relevant past medical, surgical, family and social history reviewed and updated as indicated. Interim medical history since our last visit reviewed. Allergies and medications reviewed and updated.  Review of Systems  Constitutional:  Negative for chills and fever.   Eyes:  Negative for visual disturbance.  Respiratory:  Negative for chest tightness and shortness of breath.   Cardiovascular:  Negative for chest pain and leg swelling.  Musculoskeletal:  Positive for arthralgias, back pain and myalgias. Negative for gait problem.  Skin:  Negative for rash.  Neurological:  Negative for light-headedness and headaches.  Psychiatric/Behavioral:  Negative for agitation and behavioral problems.   All other systems reviewed and are negative.  Per HPI unless specifically indicated above   Allergies as of 05/31/2021       Reactions   Dilaudid [hydromorphone Hcl] Nausea And Vomiting   Hydromorphone Nausea And Vomiting        Medication List        Accurate as of May 31, 2021 12:11 PM. If you have any questions, ask your nurse or doctor.          STOP taking these medications    ALPRAZolam 0.5 MG tablet Commonly known as: Xanax Stopped by: June 02, 2021 Mckinnley Smithey, MD       TAKE these medications    Biotin 5000 MCG Tabs Take 10,000 mcg daily by mouth.   CALCIUM 1200 PO Take by mouth.   conjugated estrogens 0.625 MG/GM vaginal cream Commonly known as: Premarin Use 0.5 gm in vagina at hs for 2 weeks then 2-3 x weekly   docusate sodium 100 MG capsule Commonly known as: Colace Take 1 capsule (100 mg total) 2 (two) times daily as needed by mouth for mild constipation.   esomeprazole  40 MG capsule Commonly known as: NEXIUM Take 40 mg by mouth daily before breakfast.   gabapentin 300 MG capsule Commonly known as: NEURONTIN Take 1 capsule (300 mg total) by mouth at bedtime. 600 mg at night, 300 mg in am What changed:  how much to take how to take this when to take this Changed by: Nils PyleJoshua A Tu Bayle, MD   levothyroxine 88 MCG tablet Commonly known as: SYNTHROID Take 88 mcg by mouth every evening.   lisinopril 10 MG tablet Commonly known as: ZESTRIL Take 10 mg by mouth every morning.   metFORMIN 500 MG 24 hr tablet Commonly  known as: GLUCOPHAGE-XR Take 1,000 mg 3 (three) times daily by mouth.   methocarbamol 750 MG tablet Commonly known as: ROBAXIN Take 1 tablet (750 mg total) by mouth at bedtime as needed for muscle spasms. What changed: when to take this Changed by: Elige RadonJoshua A Somara Frymire, MD   metoprolol tartrate 25 MG tablet Commonly known as: LOPRESSOR Take 25 mg by mouth 2 (two) times daily.   multivitamin-lutein Caps capsule Take 1 capsule by mouth daily.   rosuvastatin 5 MG tablet Commonly known as: CRESTOR Take 5 mg by mouth at bedtime.   spironolactone 25 MG tablet Commonly known as: ALDACTONE Take 12.5 mg by mouth daily.   SYSTANE OP Apply 1 drop to eye 2 (two) times daily as needed (dry eyes).   traMADol 50 MG tablet Commonly known as: ULTRAM Take 1 tablet (50 mg total) by mouth at bedtime as needed. Started by: Elige RadonJoshua A Shenice Dolder, MD   vitamin B-12 500 MCG tablet Commonly known as: CYANOCOBALAMIN Take 500 mcg by mouth daily.   VITAMIN D PO Take 1 tablet by mouth daily.         Objective:   BP 138/67    Pulse 69    Ht 5\' 8"  (1.727 m)    Wt 191 lb (86.6 kg)    SpO2 95%    BMI 29.04 kg/m   Wt Readings from Last 3 Encounters:  05/31/21 191 lb (86.6 kg)  04/22/21 188 lb (85.3 kg)  10/26/20 196 lb (88.9 kg)    Physical Exam Vitals and nursing note reviewed.  Constitutional:      Appearance: Normal appearance.  Eyes:     Conjunctiva/sclera: Conjunctivae normal.  Neurological:     Mental Status: She is alert and oriented to person, place, and time.     Gait: Gait normal.  Psychiatric:        Mood and Affect: Mood normal.        Behavior: Behavior normal.        Thought Content: Thought content normal.        Judgment: Judgment normal.      Assessment & Plan:   Problem List Items Addressed This Visit       Other   Spinal stenosis at L4-L5 level   Relevant Medications   gabapentin (NEURONTIN) 300 MG capsule   methocarbamol (ROBAXIN) 750 MG tablet   traMADol  (ULTRAM) 50 MG tablet   Other Visit Diagnoses     Controlled substance agreement signed    -  Primary   Relevant Orders   ToxASSURE Select 13 (MW), Urine   Osteopenia of neck of right femur         For osteoporosis/osteopenia recommend that she continue calcium and vitamin D because she does not have full-blown osteoporosis.  Will take over the gabapentin and Robaxin and tramadol.  We will discontinue the benzodiazepine  because of possible interaction.  Follow up plan: Return in about 3 months (around 08/29/2021), or if symptoms worsen or fail to improve, for Pain management recheck.  Counseling provided for all of the vaccine components Orders Placed This Encounter  Procedures   ToxASSURE Select 13 (MW), Urine    Arville Care, MD [facility] 05/31/2021, 12:11 PM

## 2021-06-06 LAB — TOXASSURE SELECT 13 (MW), URINE

## 2021-09-01 ENCOUNTER — Encounter: Payer: Self-pay | Admitting: Family Medicine

## 2021-09-01 ENCOUNTER — Ambulatory Visit: Payer: BC Managed Care – PPO | Admitting: Family Medicine

## 2021-09-01 VITALS — BP 126/83 | HR 60 | Ht 68.0 in | Wt 195.0 lb

## 2021-09-01 DIAGNOSIS — E039 Hypothyroidism, unspecified: Secondary | ICD-10-CM | POA: Diagnosis not present

## 2021-09-01 DIAGNOSIS — M48061 Spinal stenosis, lumbar region without neurogenic claudication: Secondary | ICD-10-CM | POA: Diagnosis not present

## 2021-09-01 DIAGNOSIS — E782 Mixed hyperlipidemia: Secondary | ICD-10-CM

## 2021-09-01 DIAGNOSIS — I10 Essential (primary) hypertension: Secondary | ICD-10-CM | POA: Diagnosis not present

## 2021-09-01 DIAGNOSIS — E1169 Type 2 diabetes mellitus with other specified complication: Secondary | ICD-10-CM

## 2021-09-01 LAB — BAYER DCA HB A1C WAIVED: HB A1C (BAYER DCA - WAIVED): 6.4 % — ABNORMAL HIGH (ref 4.8–5.6)

## 2021-09-01 MED ORDER — TRAMADOL HCL 50 MG PO TABS: 50.0000 mg | ORAL_TABLET | Freq: Every evening | ORAL | 2 refills | Status: DC | PRN

## 2021-09-01 NOTE — Progress Notes (Signed)
? ?BP 126/83   Pulse 60   Ht '5\' 8"'  (1.727 m)   Wt 195 lb (88.5 kg)   SpO2 95%   BMI 29.65 kg/m?   ? ?Subjective:  ? ?Patient ID: [name], female    DOB: Apr 16, 1951, 71 y.o.   MRN: [id number] ? ?HPI: ?[name] is a 71 y.o. female presenting on 09/01/2021 for Medical Management of Chronic Issues and Spinal Stenosis (74mfollow up- tramadol refill- contract UTD) ? ? ?HPI ?Pain assessment: ?Cause of pain-spinal stenosis lumbar ?Pain location-lower back ?Pain on scale of 1-10-3 ?Frequency-Daily ?What increases pain-certain movements or being on her feet for long periods of time ?What makes pain Better-tramadol ?Effects on ADL -minimal ?Any change in general medical condition-none ? ?Current opioids rx-tramadol 50 mg nightly as needed ?# meds rx-30 ?Effectiveness of current meds-works well, mainly uses in the evening to sleep ?Adverse reactions from pain meds-none ?Morphine equivalent-5 ? ?Pill count performed-No ?Last drug screen -06/16/2021 ?( high risk q359mmoderate risk q6m43mow risk yearly ) ?Urine drug screen today- No ?Was the [facility]-yes ? If yes were their any concerning findings? -No ? ? ?Overdose risk: 160 ? ?Pain contract signed on: 06/16/2021 ? ?Type 2 diabetes mellitus ?Patient comes in today for recheck of his diabetes. Patient has been currently taking metformin. Patient is currently on an ACE inhibitor/ARB. Patient has not seen an ophthalmologist this year. Patient denies any issues with their feet. The symptom started onset as an adult hypertension hyperlipidemia and hypothyroidism ARE RELATED TO DM  ? ?Hypertension ?Patient is currently on lisinopril, and their blood pressure today is 126/83. Patient denies any lightheadedness or dizziness. Patient denies headaches, blurred vision, chest pains, shortness of breath, or weakness. Denies any side effects from medication and is content with current medication.  ? ?Hyperlipidemia ?Patient is coming in for recheck of his hyperlipidemia. The  patient is currently taking crestor. They deny any issues with myalgias or history of liver damage from it. They deny any focal numbness or weakness or chest pain.  ? ?Hypothyroidism recheck ?Patient is coming in for thyroid recheck today as well. They deny any issues with hair changes or heat or cold problems or diarrhea or constipation. They deny any chest pain or palpitations. They are currently on levothyroxine 88 micrograms  ? ?Relevant past medical, surgical, family and social history reviewed and updated as indicated. Interim medical history since our last visit reviewed. ?Allergies and medications reviewed and updated. ? ?Review of Systems  ?Constitutional:  Negative for chills and fever.  ?Eyes:  Negative for visual disturbance.  ?Respiratory:  Negative for chest tightness and shortness of breath.   ?Cardiovascular:  Negative for chest pain and leg swelling.  ?Musculoskeletal:  Negative for back pain and gait problem.  ?Skin:  Negative for rash.  ?Neurological:  Negative for light-headedness and headaches.  ?Psychiatric/Behavioral:  Negative for agitation and behavioral problems.   ?All other systems reviewed and are negative. ? ?Per HPI unless specifically indicated above ? ? ?Allergies as of 09/01/2021   ? ?   Reactions  ? Dilaudid [hydromorphone Hcl] Nausea And Vomiting  ? Hydromorphone Nausea And Vomiting  ? ?  ? ?  ?Medication List  ?  ? ?  ? Accurate as of Sep 01, 2021  8:33 AM. If you have any questions, ask your nurse or doctor.  ?  ?  ? ?  ? ?Biotin 5000 MCG Tabs ?Take 10,000 mcg daily by mouth. ?  ?CALCIUM 1200  PO ?Take by mouth. ?  ?conjugated estrogens 0.625 MG/GM vaginal cream ?Commonly known as: Premarin ?Use 0.5 gm in vagina at hs for 2 weeks then 2-3 x weekly ?  ?docusate sodium 100 MG capsule ?Commonly known as: Colace ?Take 1 capsule (100 mg total) 2 (two) times daily as needed by mouth for mild constipation. ?  ?esomeprazole 40 MG capsule ?Commonly known as: Gilbert ?Take 40 mg by mouth daily  before breakfast. ?  ?gabapentin 300 MG capsule ?Commonly known as: NEURONTIN ?Take 1 capsule (300 mg total) by mouth at bedtime. 600 mg at night, 300 mg in am ?  ?levothyroxine 88 MCG tablet ?Commonly known as: SYNTHROID ?Take 88 mcg by mouth every evening. ?  ?lisinopril 10 MG tablet ?Commonly known as: ZESTRIL ?Take 10 mg by mouth every morning. ?  ?metFORMIN 500 MG 24 hr tablet ?Commonly known as: GLUCOPHAGE-XR ?Take 1,000 mg 3 (three) times daily by mouth. ?  ?methocarbamol 750 MG tablet ?Commonly known as: ROBAXIN ?Take 1 tablet (750 mg total) by mouth at bedtime as needed for muscle spasms. ?  ?metoprolol tartrate 25 MG tablet ?Commonly known as: LOPRESSOR ?Take 25 mg by mouth 2 (two) times daily. ?  ?multivitamin-lutein Caps capsule ?Take 1 capsule by mouth daily. ?  ?rosuvastatin 5 MG tablet ?Commonly known as: CRESTOR ?Take 5 mg by mouth at bedtime. ?  ?spironolactone 25 MG tablet ?Commonly known as: ALDACTONE ?Take 12.5 mg by mouth daily. ?  ?SYSTANE OP ?Apply 1 drop to eye 2 (two) times daily as needed (dry eyes). ?  ?traMADol 50 MG tablet ?Commonly known as: ULTRAM ?Take 1 tablet (50 mg total) by mouth at bedtime as needed. ?  ?vitamin B-12 500 MCG tablet ?Commonly known as: CYANOCOBALAMIN ?Take 500 mcg by mouth daily. ?  ?VITAMIN D PO ?Take 1 tablet by mouth daily. ?  ? ?  ? ? ? ?Objective:  ? ?BP 126/83   Pulse 60   Ht '5\' 8"'  (1.727 m)   Wt 195 lb (88.5 kg)   SpO2 95%   BMI 29.65 kg/m?   ?Wt Readings from Last 3 Encounters:  ?09/01/21 195 lb (88.5 kg)  ?05/31/21 191 lb (86.6 kg)  ?04/22/21 188 lb (85.3 kg)  ?  ?Physical Exam ?Vitals and nursing note reviewed.  ?Constitutional:   ?   General: She is not in acute distress. ?   Appearance: She is well-developed. She is not diaphoretic.  ?Eyes:  ?   Conjunctiva/sclera: Conjunctivae normal.  ?Cardiovascular:  ?   Rate and Rhythm: Normal rate and regular rhythm.  ?   Heart sounds: Normal heart sounds. No murmur heard. ?Pulmonary:  ?   Effort: Pulmonary  effort is normal. No respiratory distress.  ?   Breath sounds: Normal breath sounds. No wheezing.  ?Musculoskeletal:     ?   General: No tenderness. Normal range of motion.  ?Skin: ?   General: Skin is warm and dry.  ?   Findings: No rash.  ?Neurological:  ?   Mental Status: She is alert and oriented to person, place, and time.  ?   Coordination: Coordination normal.  ?Psychiatric:     ?   Behavior: Behavior normal.  ? ? ? ? ?Assessment & Plan:  ? ?Problem List Items Addressed This Visit   ? ?  ? Cardiovascular and Mediastinum  ? Hypertension - Primary  ? Relevant Orders  ? CBC with Differential/Platelet  ? CMP14+EGFR  ?  ? Endocrine  ? Hypothyroidism  ?  Relevant Orders  ? TSH  ? Diabetes mellitus, type 2 ([place])  ? Relevant Orders  ? Bayer DCA Hb A1c Waived  ?  ? Other  ? Mixed hyperlipidemia  ? Relevant Orders  ? Lipid panel  ? Spinal stenosis at L4-L5 level  ? Relevant Orders  ? CBC with Differential/Platelet  ? CMP14+EGFR  ?  ?We will check blood work today, blood pressure and everything looks good.  We will do refill of Ultram for spinal stenosis.  No changes. ?Follow up plan: ?Return in about 3 months (around 12/02/2021), or if symptoms worsen or fail to improve, for Diabetes and hypertension and hypothyroid. ? ?Counseling provided for all of the vaccine components ?Orders Placed This Encounter  ?Procedures  ? CBC with Differential/Platelet  ? CMP14+EGFR  ? Lipid panel  ? TSH  ? Bayer DCA Hb A1c Waived  ? ? ?Caryl Pina, MD ?[facility] ?09/01/2021, 8:33 AM ? ? ? ? ?

## 2021-09-02 LAB — CBC WITH DIFFERENTIAL/PLATELET
Basophils Absolute: 0 10*3/uL (ref 0.0–0.2)
Basos: 1 %
EOS (ABSOLUTE): 0.2 10*3/uL (ref 0.0–0.4)
Eos: 3 %
Hematocrit: 41.5 % (ref 34.0–46.6)
Hemoglobin: 13.4 g/dL (ref 11.1–15.9)
Immature Grans (Abs): 0 10*3/uL (ref 0.0–0.1)
Immature Granulocytes: 0 %
Lymphocytes Absolute: 2.1 10*3/uL (ref 0.7–3.1)
Lymphs: 27 %
MCH: 27.4 pg (ref 26.6–33.0)
MCHC: 32.3 g/dL (ref 31.5–35.7)
MCV: 85 fL (ref 79–97)
Monocytes Absolute: 0.6 10*3/uL (ref 0.1–0.9)
Monocytes: 8 %
Neutrophils Absolute: 5 10*3/uL (ref 1.4–7.0)
Neutrophils: 61 %
Platelets: 276 10*3/uL (ref 150–450)
RBC: 4.89 x10E6/uL (ref 3.77–5.28)
RDW: 14.1 % (ref 11.7–15.4)
WBC: 8.1 10*3/uL (ref 3.4–10.8)

## 2021-09-02 LAB — CMP14+EGFR
ALT: 17 IU/L (ref 0–32)
AST: 17 IU/L (ref 0–40)
Albumin/Globulin Ratio: 1.8 (ref 1.2–2.2)
Albumin: 4.4 g/dL (ref 3.8–4.8)
Alkaline Phosphatase: 77 IU/L (ref 44–121)
BUN/Creatinine Ratio: 12 (ref 12–28)
BUN: 9 mg/dL (ref 8–27)
Bilirubin Total: 0.9 mg/dL (ref 0.0–1.2)
CO2: 25 mmol/L (ref 20–29)
Calcium: 10 mg/dL (ref 8.7–10.3)
Chloride: 101 mmol/L (ref 96–106)
Creatinine, Ser: 0.74 mg/dL (ref 0.57–1.00)
Globulin, Total: 2.4 g/dL (ref 1.5–4.5)
Glucose: 105 mg/dL — ABNORMAL HIGH (ref 70–99)
Potassium: 5.1 mmol/L (ref 3.5–5.2)
Sodium: 141 mmol/L (ref 134–144)
Total Protein: 6.8 g/dL (ref 6.0–8.5)
eGFR: 88 mL/min/{1.73_m2} (ref >59)

## 2021-09-02 LAB — LIPID PANEL
Chol/HDL Ratio: 3.3 ratio (ref 0.0–4.4)
Cholesterol, Total: 124 mg/dL (ref 100–199)
HDL: 38 mg/dL — ABNORMAL LOW (ref >39)
LDL Chol Calc (NIH): 61 mg/dL (ref 0–99)
Triglycerides: 140 mg/dL (ref 0–149)
VLDL Cholesterol Cal: 25 mg/dL (ref 5–40)

## 2021-09-02 LAB — TSH: TSH: 2.91 u[IU]/mL (ref 0.450–4.500)

## 2021-09-06 DIAGNOSIS — M1711 Unilateral primary osteoarthritis, right knee: Secondary | ICD-10-CM | POA: Diagnosis not present

## 2021-09-06 DIAGNOSIS — M25551 Pain in right hip: Secondary | ICD-10-CM | POA: Diagnosis not present

## 2021-09-17 ENCOUNTER — Telehealth: Payer: Self-pay | Admitting: Family Medicine

## 2021-09-17 NOTE — Telephone Encounter (Signed)
Patient calling to see if she can double her methocarbamol (ROBAXIN) 750 MG tablet because the cramps from her foot up to her leg are not getting better. Please call back and advise.

## 2021-09-17 NOTE — Telephone Encounter (Signed)
I would not double your Robaxin, that would be higher than the base dose, you can take some magnesium or potassium over-the-counter with it and that should help with cramping.  If that does not help then please come in and see [place] and we can come up with another plan

## 2021-09-17 NOTE — Telephone Encounter (Signed)
Nothing is helping. She already takes Mg.  Wants to schedule an appt  Appt scheduled for 5/22 at 8:25am

## 2021-09-20 ENCOUNTER — Encounter: Payer: Self-pay | Admitting: Family Medicine

## 2021-09-20 ENCOUNTER — Ambulatory Visit: Payer: BC Managed Care – PPO | Admitting: Family Medicine

## 2021-09-20 VITALS — BP 124/71 | HR 62 | Temp 97.8°F | Ht 68.0 in | Wt 194.0 lb

## 2021-09-20 DIAGNOSIS — G2581 Restless legs syndrome: Secondary | ICD-10-CM | POA: Diagnosis not present

## 2021-09-20 MED ORDER — PRAMIPEXOLE DIHYDROCHLORIDE 0.125 MG PO TABS: 0.1250 mg | ORAL_TABLET | Freq: Every day | ORAL | 3 refills | Status: DC

## 2021-09-20 NOTE — Progress Notes (Signed)
BP 124/71   Pulse 62   Temp 97.8 F (36.6 C)   Ht 5\' 8"  (1.727 m)   Wt 194 lb (88 kg)   SpO2 99%   BMI 29.50 kg/m    Subjective:   Patient ID: [name], female    DOB: 03-29-1951, 71 y.o.   MRN: [id number]  HPI: [name] is a 71 y.o. female presenting on 09/20/2021 for Pain (And cramping. BLE. Current medications not helping. Already takes Mg. Not sure about adding potassium due to cardio concerns.) and Insect Bite (Left leg. Red and had swelling over the weekend. ? Tick bite)   HPI Restless legs Patient comes in complaining of restless leg syndrome.  She says she tries to go to bed at night with a feeling that her legs are just cramping and pain moving and she cannot get to bed.  She says she have to get up and walk back and forth multiple times before she can go to bed.  She is tried adding Aleve to her gabapentin and Robaxin and tried adding muscle rubs and it does not seem to help.  She is got to where she is dreading going to sleep at night because of this issue.  She is taking magnesium and trying to eat more bananas and potassium is just not helping.  She also complains of small spot of erythema on her left lateral knee area where there looks like there was a bite and some erythema which she says is gotten a lot less red and is reducing and feeling better but it was warm initially and she does not know if she was bit by but she just noticed over the weekend and wanted to mention it.  She says it is improving already.  Relevant past medical, surgical, family and social history reviewed and updated as indicated. Interim medical history since our last visit reviewed. Allergies and medications reviewed and updated.  Review of Systems  Constitutional:  Negative for chills and fever.  Eyes:  Negative for visual disturbance.  Respiratory:  Negative for chest tightness and shortness of breath.   Cardiovascular:  Negative for chest pain and leg swelling.  Genitourinary:   Negative for difficulty urinating and dysuria.  Musculoskeletal:  Positive for arthralgias and myalgias. Negative for back pain and gait problem.  Skin:  Positive for color change and rash.  Neurological:  Negative for light-headedness and headaches.  Psychiatric/Behavioral:  Negative for agitation and behavioral problems.   All other systems reviewed and are negative.  Per HPI unless specifically indicated above   Allergies as of 09/20/2021       Reactions   Dilaudid [hydromorphone Hcl] Nausea And Vomiting   Hydromorphone Nausea And Vomiting        Medication List        Accurate as of Sep 20, 2021  8:45 AM. If you have any questions, ask your nurse or doctor.          STOP taking these medications    docusate sodium 100 MG capsule Commonly known as: Colace Stopped by: Worthy Rancher, MD   vitamin B-12 500 MCG tablet Commonly known as: CYANOCOBALAMIN Stopped by: Fransisca Kaufmann Liesa Tsan, MD       TAKE these medications    Biotin 5000 MCG Tabs Take 10,000 mcg daily by mouth.   CALCIUM 1200 PO Take by mouth.   conjugated estrogens 0.625 MG/GM vaginal cream Commonly known as: Premarin Use 0.5 gm in vagina at hs  for 2 weeks then 2-3 x weekly   esomeprazole 40 MG capsule Commonly known as: NEXIUM Take 40 mg by mouth daily before breakfast.   gabapentin 300 MG capsule Commonly known as: NEURONTIN Take 1 capsule (300 mg total) by mouth at bedtime. 600 mg at night, 300 mg in am   levothyroxine 88 MCG tablet Commonly known as: SYNTHROID Take 88 mcg by mouth every evening.   lisinopril 10 MG tablet Commonly known as: ZESTRIL Take 10 mg by mouth every morning.   metFORMIN 500 MG 24 hr tablet Commonly known as: GLUCOPHAGE-XR Take 1,000 mg 3 (three) times daily by mouth.   methocarbamol 750 MG tablet Commonly known as: ROBAXIN Take 1 tablet (750 mg total) by mouth at bedtime as needed for muscle spasms.   metoprolol tartrate 25 MG tablet Commonly known  as: LOPRESSOR Take 25 mg by mouth 2 (two) times daily.   multivitamin-lutein Caps capsule Take 1 capsule by mouth daily.   pramipexole 0.125 MG tablet Commonly known as: Mirapex Take 1 tablet (0.125 mg total) by mouth daily after supper. Started by: Fransisca Kaufmann Shaunee Mulkern, MD   rosuvastatin 5 MG tablet Commonly known as: CRESTOR Take 5 mg by mouth at bedtime.   spironolactone 25 MG tablet Commonly known as: ALDACTONE Take 12.5 mg by mouth daily.   SYSTANE OP Apply 1 drop to eye 2 (two) times daily as needed (dry eyes).   traMADol 50 MG tablet Commonly known as: ULTRAM Take 1 tablet (50 mg total) by mouth at bedtime as needed.   VITAMIN D PO Take 1 tablet by mouth daily.         Objective:   BP 124/71   Pulse 62   Temp 97.8 F (36.6 C)   Ht 5\' 8"  (1.727 m)   Wt 194 lb (88 kg)   SpO2 99%   BMI 29.50 kg/m   Wt Readings from Last 3 Encounters:  09/20/21 194 lb (88 kg)  09/01/21 195 lb (88.5 kg)  05/31/21 191 lb (86.6 kg)    Physical Exam Vitals and nursing note reviewed.  Constitutional:      Appearance: Normal appearance.  Musculoskeletal:        General: No swelling, tenderness or deformity. Normal range of motion.  Skin:    General: Skin is warm.     Findings: Erythema, lesion and rash (Small eschar with a 4 and half centimeter area slight erythema, very faint, no central clearing.) present.  Neurological:     Mental Status: She is alert.      Assessment & Plan:   Problem List Items Addressed This Visit   None Visit Diagnoses     RLS (restless legs syndrome)    -  Primary   Relevant Medications   pramipexole (MIRAPEX) 0.125 MG tablet       She will try and hold her Robaxin while she is taking the Mirapex and see how it does for her, if we need to add the Robaxin back in the future we can. Follow up plan: Return if symptoms worsen or fail to improve.  Counseling provided for all of the vaccine components No orders of the defined types were  placed in this encounter.   Caryl Pina, MD [facility] 09/20/2021, 8:45 AM

## 2021-09-20 NOTE — Patient Instructions (Signed)
Restless Legs Syndrome Restless legs syndrome is a condition that causes uncomfortable feelings or sensations in the legs, especially while sitting or lying down. The sensations usually cause an overwhelming urge to move the legs. The arms can also sometimes be affected. The condition can range from mild to severe. The symptoms often interfere with a person's ability to sleep. What are the causes? The cause of this condition is not known. What increases the risk? The following factors may make you more likely to develop this condition: Being older than 50. Pregnancy. Being a woman. In general, the condition is more common in women than in men. A family history of the condition. Having iron deficiency. Overuse of caffeine, nicotine, or alcohol. Certain medical conditions, such as kidney disease, Parkinson's disease, or nerve damage. Certain medicines, such as those for high blood pressure, nausea, colds, allergies, depression, and some heart conditions. What are the signs or symptoms? The main symptom of this condition is uncomfortable sensations in the legs, such as: Pulling. Tingling. Prickling. Throbbing. Crawling. Burning. Usually, the sensations: Affect both sides of the body. Are worse when you sit or lie down. Are worse at night. These may make it difficult to fall asleep. Make you have a strong urge to move your legs. Are temporarily relieved by moving your legs or standing. The arms can also be affected, but this is rare. People who have this condition often have tiredness during the day because of their lack of sleep at night. How is this diagnosed? This condition may be diagnosed based on: Your symptoms. Blood tests. In some cases, you may be monitored in a sleep lab by a specialist (a sleep study). This can detect any disruptions in your sleep. How is this treated? This condition is treated by managing the symptoms. This may include: Lifestyle changes, such as  exercising, using relaxation techniques, and avoiding caffeine, alcohol, or tobacco. Iron supplements. Medicines. Parkinson's medications may be tried first. Anti-seizure medications can also be helpful. Follow these instructions at home: General instructions Take over-the-counter and prescription medicines only as told by your health care provider. Use methods to help relieve the uncomfortable sensations, such as: Massaging your legs. Walking or stretching. Taking a cold or hot bath. Keep all follow-up visits. This is important. Lifestyle     Practice good sleep habits. For example, go to bed and get up at the same time every day. Most adults should get 7-9 hours of sleep each night. Exercise regularly. Try to get at least 30 minutes of exercise most days of the week. Practice ways of relaxing, such as yoga or meditation. Avoid caffeine and alcohol. Do not use any products that contain nicotine or tobacco. These products include cigarettes, chewing tobacco, and vaping devices, such as e-cigarettes. If you need help quitting, ask your health care provider. Where to find more information National Institute of Neurological Disorders and Stroke: www.ninds.nih.gov Contact a health care provider if: Your symptoms get worse or they do not improve with treatment. Summary Restless legs syndrome is a condition that causes uncomfortable feelings or sensations in the legs, especially while sitting or lying down. The symptoms often interfere with your ability to sleep. This condition is treated by managing the symptoms. You may need to make lifestyle changes or take medicines. This information is not intended to replace advice given to you by your health care provider. Make sure you discuss any questions you have with your health care provider. Document Revised: 11/29/2020 Document Reviewed: 11/29/2020 Elsevier Patient Education    2023 Elsevier Inc.  

## 2021-12-10 ENCOUNTER — Ambulatory Visit: Payer: BC Managed Care – PPO | Admitting: Family Medicine

## 2021-12-10 ENCOUNTER — Encounter: Payer: Self-pay | Admitting: Family Medicine

## 2021-12-10 VITALS — BP 104/69 | HR 70 | Temp 97.7°F | Resp 20 | Ht 68.0 in | Wt 194.0 lb

## 2021-12-10 DIAGNOSIS — I10 Essential (primary) hypertension: Secondary | ICD-10-CM | POA: Diagnosis not present

## 2021-12-10 DIAGNOSIS — M48061 Spinal stenosis, lumbar region without neurogenic claudication: Secondary | ICD-10-CM

## 2021-12-10 DIAGNOSIS — G2581 Restless legs syndrome: Secondary | ICD-10-CM

## 2021-12-10 DIAGNOSIS — E1169 Type 2 diabetes mellitus with other specified complication: Secondary | ICD-10-CM

## 2021-12-10 DIAGNOSIS — E782 Mixed hyperlipidemia: Secondary | ICD-10-CM

## 2021-12-10 DIAGNOSIS — E039 Hypothyroidism, unspecified: Secondary | ICD-10-CM

## 2021-12-10 LAB — BAYER DCA HB A1C WAIVED: HB A1C (BAYER DCA - WAIVED): 6.3 % — ABNORMAL HIGH (ref 4.8–5.6)

## 2021-12-10 MED ORDER — PRAMIPEXOLE DIHYDROCHLORIDE 0.125 MG PO TABS: 0.1250 mg | ORAL_TABLET | Freq: Every day | ORAL | 3 refills | Status: DC

## 2021-12-10 MED ORDER — TRAMADOL HCL 50 MG PO TABS: 50.0000 mg | ORAL_TABLET | Freq: Every evening | ORAL | 2 refills | Status: DC | PRN

## 2021-12-10 NOTE — Progress Notes (Signed)
BP 104/69   Pulse 70   Temp 97.7 F (36.5 C) (Temporal)   Resp 20   Ht 5\' 8"  (1.727 m)   Wt 194 lb (88 kg)   SpO2 96%   BMI 29.50 kg/m    Subjective:   Patient ID: , female    DOB: 1950-07-20, 71 y.o.   MRN: [id number]  HPI: [name] is a 71 y.o. female presenting on 12/10/2021 for Medical Management of Chronic Issues   HPI Type 2 diabetes mellitus Patient comes in today for recheck of his diabetes. Patient has been currently taking metformin. Patient is currently on an ACE inhibitor/ARB. Patient has not seen an ophthalmologist this year. Patient denies any issues with their feet. The symptom started onset as an adult hypertension and hyperlipidemia and hypothyroidism ARE RELATED TO DM   Hypertension Patient is currently on lisinopril and metoprolol and spironolactone, and their blood pressure today is 104/69. Patient denies any lightheadedness or dizziness. Patient denies headaches, blurred vision, chest pains, shortness of breath, or weakness. Denies any side effects from medication and is content with current medication.   Hyperlipidemia Patient is coming in for recheck of his hyperlipidemia. The patient is currently taking Crestor. They deny any issues with myalgias or history of liver damage from it. They deny any focal numbness or weakness or chest pain.   Hypothyroidism recheck Patient is coming in for thyroid recheck today as well. They deny any issues with hair changes or heat or cold problems or diarrhea or constipation. They deny any chest pain or palpitations. They are currently on levothyroxine 88 micrograms   Pain assessment: Cause of pain-osteopenia of lumbar spine and spinal stenosis Pain location-lower back Pain on scale of 1-10-3-4 Frequency-most days What increases pain-certain movements and being on her feet What makes pain Better-rest and tramadol Effects on ADL -minimal Any change in general medical condition-none  Current opioids  rx-tramadol 50 mg nightly as needed # meds rx-30/month Effectiveness of current meds-works well Adverse reactions from pain meds-none Morphine equivalent-5  Pill count performed-No Last drug screen -06/16/2021 ( high risk q67m, moderate risk q76m, low risk yearly ) Urine drug screen today- No Was the NCCSR reviewed-yes  If yes were their any concerning findings? -None, the prescription usually lasts for more than a month  Pain contract signed on: 06/16/2021  Relevant past medical, surgical, family and social history reviewed and updated as indicated. Interim medical history since our last visit reviewed. Allergies and medications reviewed and updated.  Review of Systems  Constitutional:  Negative for chills and fever.  HENT:  Negative for congestion, ear discharge and ear pain.   Eyes:  Negative for redness and visual disturbance.  Respiratory:  Negative for chest tightness and shortness of breath.   Cardiovascular:  Negative for chest pain and leg swelling.  Genitourinary:  Negative for difficulty urinating and dysuria.  Musculoskeletal:  Positive for arthralgias and back pain. Negative for gait problem.  Skin:  Negative for rash.  Neurological:  Positive for numbness. Negative for light-headedness and headaches.  Psychiatric/Behavioral:  Negative for agitation and behavioral problems.   All other systems reviewed and are negative.   Per HPI unless specifically indicated above   Allergies as of 12/10/2021       Reactions   Dilaudid [hydromorphone Hcl] Nausea And Vomiting   Hydromorphone Nausea And Vomiting        Medication List        Accurate as of December 10, 2021  8:21 AM. If you have any questions, ask your nurse or doctor.          Biotin 5000 MCG Tabs Take 10,000 mcg daily by mouth.   CALCIUM 1200 PO Take by mouth.   conjugated estrogens 0.625 MG/GM vaginal cream Commonly known as: Premarin Use 0.5 gm in vagina at hs for 2 weeks then 2-3 x weekly    esomeprazole 40 MG capsule Commonly known as: NEXIUM Take 40 mg by mouth daily before breakfast.   gabapentin 300 MG capsule Commonly known as: NEURONTIN Take 1 capsule (300 mg total) by mouth at bedtime. 600 mg at night, 300 mg in am   levothyroxine 88 MCG tablet Commonly known as: SYNTHROID Take 88 mcg by mouth every evening.   lisinopril 10 MG tablet Commonly known as: ZESTRIL Take 10 mg by mouth every morning.   metFORMIN 500 MG 24 hr tablet Commonly known as: GLUCOPHAGE-XR Take 1,000 mg 3 (three) times daily by mouth.   methocarbamol 750 MG tablet Commonly known as: ROBAXIN Take 1 tablet (750 mg total) by mouth at bedtime as needed for muscle spasms.   metoprolol tartrate 25 MG tablet Commonly known as: LOPRESSOR Take 25 mg by mouth 2 (two) times daily.   multivitamin-lutein Caps capsule Take 1 capsule by mouth daily.   pramipexole 0.125 MG tablet Commonly known as: Mirapex Take 1 tablet (0.125 mg total) by mouth daily after supper.   rosuvastatin 5 MG tablet Commonly known as: CRESTOR Take 5 mg by mouth at bedtime.   spironolactone 25 MG tablet Commonly known as: ALDACTONE Take 12.5 mg by mouth daily.   SYSTANE OP Apply 1 drop to eye 2 (two) times daily as needed (dry eyes).   traMADol 50 MG tablet Commonly known as: ULTRAM Take 1 tablet (50 mg total) by mouth at bedtime as needed.   VITAMIN D PO Take 1 tablet by mouth daily.         Objective:   BP 104/69   Pulse 70   Temp 97.7 F (36.5 C) (Temporal)   Resp 20   Ht 5\' 8"  (1.727 m)   Wt 194 lb (88 kg)   SpO2 96%   BMI 29.50 kg/m   Wt Readings from Last 3 Encounters:  12/10/21 194 lb (88 kg)  09/20/21 194 lb (88 kg)  09/01/21 195 lb (88.5 kg)    Physical Exam Vitals and nursing note reviewed.  Constitutional:      General: She is not in acute distress.    Appearance: She is well-developed. She is not diaphoretic.  Eyes:     Conjunctiva/sclera: Conjunctivae normal.   Cardiovascular:     Rate and Rhythm: Normal rate and regular rhythm.     Heart sounds: Normal heart sounds. No murmur heard. Pulmonary:     Effort: Pulmonary effort is normal. No respiratory distress.     Breath sounds: Normal breath sounds. No wheezing.  Musculoskeletal:        General: No swelling.  Skin:    General: Skin is warm and dry.     Findings: No rash.  Neurological:     Mental Status: She is alert and oriented to person, place, and time.     Coordination: Coordination normal.  Psychiatric:        Behavior: Behavior normal.       Assessment & Plan:   Problem List Items Addressed This Visit       Cardiovascular and Mediastinum   Hypertension  Endocrine   Hypothyroidism   Diabetes mellitus, type 2 (HCC) - Primary   Relevant Orders   Bayer DCA Hb A1c Waived     Other   Mixed hyperlipidemia   Spinal stenosis at L4-L5 level   Relevant Medications   traMADol (ULTRAM) 50 MG tablet   Other Visit Diagnoses     RLS (restless legs syndrome)       Relevant Medications   pramipexole (MIRAPEX) 0.125 MG tablet       A1c looks good at 6.3, continue current medicine, refill tramadol and Mirapex for her.  She seems to be doing okay on those. Follow up plan: Return in about 3 months (around 03/12/2022), or if symptoms worsen or fail to improve, for Diabetes and hypertension and hypothyroidism.  Counseling provided for all of the vaccine components Orders Placed This Encounter  Procedures   Bayer DCA Hb A1c Waived    Arville Care, MD [facility] 12/10/2021, 8:21 AM

## 2021-12-14 ENCOUNTER — Telehealth: Payer: Self-pay

## 2021-12-14 NOTE — Chronic Care Management (AMB) (Signed)
  Care Coordination  Note  12/14/2021 Name: [name] MRN: [id number] DOB: [birth year]  [name] is a 71 y.o. year old female who is a primary care patient of Dettinger, Elige Radon, MD. I reached out to [name] by phone today to offer care coordination services.      [name] was given information about Care Coordination services today including:  The Care Coordination services include support from the care team which includes your Nurse Coordinator, Clinical Social Worker, or Pharmacist.  The Care Coordination team is here to help remove barriers to the health concerns and goals most important to you. Care Coordination services are voluntary and the patient may decline or stop services at any time by request to their care team member.   Patient did not agree to participate in care coordination services at this time.  Follow up plan: Patient declines further follow up or participation in care coordination services.  Penne Lash, RMA Care Guide [facility] [place], [place] [address] Direct Dial: [phone] [email or web address]

## 2021-12-29 DIAGNOSIS — M1712 Unilateral primary osteoarthritis, left knee: Secondary | ICD-10-CM | POA: Diagnosis not present

## 2021-12-29 DIAGNOSIS — M170 Bilateral primary osteoarthritis of knee: Secondary | ICD-10-CM | POA: Diagnosis not present

## 2021-12-29 DIAGNOSIS — M1711 Unilateral primary osteoarthritis, right knee: Secondary | ICD-10-CM | POA: Diagnosis not present

## 2021-12-30 DIAGNOSIS — Z1231 Encounter for screening mammogram for malignant neoplasm of breast: Secondary | ICD-10-CM | POA: Diagnosis not present

## 2022-01-04 ENCOUNTER — Other Ambulatory Visit: Payer: Self-pay | Admitting: Family Medicine

## 2022-01-04 DIAGNOSIS — M48061 Spinal stenosis, lumbar region without neurogenic claudication: Secondary | ICD-10-CM

## 2022-02-16 DIAGNOSIS — Z23 Encounter for immunization: Secondary | ICD-10-CM | POA: Diagnosis not present

## 2022-03-07 DIAGNOSIS — E119 Type 2 diabetes mellitus without complications: Secondary | ICD-10-CM | POA: Diagnosis not present

## 2022-03-17 DIAGNOSIS — Z6829 Body mass index (BMI) 29.0-29.9, adult: Secondary | ICD-10-CM | POA: Diagnosis not present

## 2022-03-17 DIAGNOSIS — M170 Bilateral primary osteoarthritis of knee: Secondary | ICD-10-CM | POA: Diagnosis not present

## 2022-03-17 DIAGNOSIS — M1711 Unilateral primary osteoarthritis, right knee: Secondary | ICD-10-CM | POA: Diagnosis not present

## 2022-03-17 DIAGNOSIS — M1712 Unilateral primary osteoarthritis, left knee: Secondary | ICD-10-CM | POA: Diagnosis not present

## 2022-03-17 DIAGNOSIS — M5416 Radiculopathy, lumbar region: Secondary | ICD-10-CM | POA: Diagnosis not present

## 2022-03-18 ENCOUNTER — Encounter: Payer: Self-pay | Admitting: Family Medicine

## 2022-03-18 ENCOUNTER — Ambulatory Visit: Payer: BC Managed Care – PPO | Admitting: Family Medicine

## 2022-03-18 VITALS — BP 109/75 | HR 77 | Temp 97.1°F | Ht 68.0 in | Wt 194.0 lb

## 2022-03-18 DIAGNOSIS — E782 Mixed hyperlipidemia: Secondary | ICD-10-CM | POA: Diagnosis not present

## 2022-03-18 DIAGNOSIS — E039 Hypothyroidism, unspecified: Secondary | ICD-10-CM

## 2022-03-18 DIAGNOSIS — E1169 Type 2 diabetes mellitus with other specified complication: Secondary | ICD-10-CM

## 2022-03-18 DIAGNOSIS — M48061 Spinal stenosis, lumbar region without neurogenic claudication: Secondary | ICD-10-CM

## 2022-03-18 DIAGNOSIS — I10 Essential (primary) hypertension: Secondary | ICD-10-CM

## 2022-03-18 MED ORDER — TRAMADOL HCL 50 MG PO TABS: 50.0000 mg | ORAL_TABLET | Freq: Every evening | ORAL | 2 refills | Status: DC | PRN

## 2022-03-18 NOTE — Progress Notes (Signed)
BP 109/75   Pulse 77   Temp (!) 97.1 F (36.2 C)   Ht 5\' 8"  (1.727 m)   Wt 194 lb (88 kg)   SpO2 96%   BMI 29.50 kg/m    Subjective:   Patient ID: , female    DOB: 12/30/1950, 71 y.o.   MRN: [id number]  HPI: [name] is a 72 y.o. female presenting on 03/18/2022 for Medical Management of Chronic Issues, Hypertension, and Diabetes   HPI Hypertension Patient is currently on metoprolol and lisinopril and spironolactone, and their blood pressure today is 109/75. Patient denies any lightheadedness or dizziness. Patient denies headaches, blurred vision, chest pains, shortness of breath, or weakness. Denies any side effects from medication and is content with current medication.   Hyperlipidemia Patient is coming in for recheck of his hyperlipidemia. The patient is currently taking Crestor. They deny any issues with myalgias or history of liver damage from it. They deny any focal numbness or weakness or chest pain.   Hypothyroidism recheck Patient is coming in for thyroid recheck today as well. They deny any issues with hair changes or heat or cold problems or diarrhea or constipation. They deny any chest pain or palpitations. They are currently on levothyroxine 88 micrograms   Type 2 diabetes mellitus Patient comes in today for recheck of his diabetes. Patient has been currently taking metformin. Patient is currently on an ACE inhibitor/ARB. Patient has seen an ophthalmologist this year. Patient denies any issues with their feet. The symptom started onset as an adult hypertension and hyperlipidemia and hypothyroidism ARE RELATED TO DM   Pain assessment: Cause of pain-spinal stenosis lumbar Pain location-lower back Pain on scale of 1-01-31-09 Frequency-most days but not every day What increases pain-laying down or sitting for long periods What makes pain Better-tramadol Effects on ADL -minimal Any change in general medical condition-none  Current opioids rx-tramadol  50 mg nightly. # meds rx-30/month Effectiveness of current meds-works well Adverse reactions from pain meds-none Morphine equivalent-5  Pill count performed-No Last drug screen 06/16/2021 ( high risk q54m, moderate risk q63m, low risk yearly ) Urine drug screen today- No Was the NCCSR reviewed-yes  If yes were their any concerning findings? -None  Pain contract signed on: 06/16/2021  Relevant past medical, surgical, family and social history reviewed and updated as indicated. Interim medical history since our last visit reviewed. Allergies and medications reviewed and updated.  Review of Systems  Constitutional:  Negative for chills and fever.  Eyes:  Negative for visual disturbance.  Respiratory:  Negative for chest tightness and shortness of breath.   Cardiovascular:  Negative for chest pain and leg swelling.  Genitourinary:  Negative for difficulty urinating and dysuria.  Musculoskeletal:  Positive for arthralgias and back pain. Negative for gait problem.  Skin:  Negative for rash.  Neurological:  Negative for light-headedness and headaches.  Psychiatric/Behavioral:  Negative for agitation and behavioral problems.   All other systems reviewed and are negative.   Per HPI unless specifically indicated above   Allergies as of 03/18/2022       Reactions   Dilaudid [hydromorphone Hcl] Nausea And Vomiting   Hydromorphone Nausea And Vomiting        Medication List        Accurate as of March 18, 2022  8:24 AM. If you have any questions, ask your nurse or doctor.          STOP taking these medications    SYSTANE OP Stopped  by: Nils Pyle, MD       TAKE these medications    Biotin 5000 MCG Tabs Take 10,000 mcg daily by mouth.   CALCIUM 1200 PO Take by mouth.   co-enzyme Q-10 30 MG capsule Take 30 mg by mouth daily.   conjugated estrogens 0.625 MG/GM vaginal cream Commonly known as: Premarin Use 0.5 gm in vagina at hs for 2 weeks then 2-3 x  weekly   esomeprazole 40 MG capsule Commonly known as: NEXIUM Take 40 mg by mouth daily before breakfast.   gabapentin 300 MG capsule Commonly known as: NEURONTIN TAKE ONE CAPSULE EVERY MORNING AND TWO AT BEDTIME   levothyroxine 88 MCG tablet Commonly known as: SYNTHROID Take 88 mcg by mouth every evening.   lisinopril 10 MG tablet Commonly known as: ZESTRIL Take 10 mg by mouth every morning.   metFORMIN 500 MG 24 hr tablet Commonly known as: GLUCOPHAGE-XR Take 1,000 mg 3 (three) times daily by mouth.   methocarbamol 750 MG tablet Commonly known as: ROBAXIN Take 1 tablet (750 mg total) by mouth at bedtime as needed for muscle spasms.   metoprolol tartrate 25 MG tablet Commonly known as: LOPRESSOR Take 25 mg by mouth 2 (two) times daily.   multivitamin-lutein Caps capsule Take 1 capsule by mouth daily.   pramipexole 0.125 MG tablet Commonly known as: Mirapex Take 1 tablet (0.125 mg total) by mouth daily after supper.   rosuvastatin 5 MG tablet Commonly known as: CRESTOR Take 5 mg by mouth at bedtime.   spironolactone 25 MG tablet Commonly known as: ALDACTONE Take 12.5 mg by mouth daily.   traMADol 50 MG tablet Commonly known as: ULTRAM Take 1 tablet (50 mg total) by mouth at bedtime as needed.   VITAMIN D PO Take 1 tablet by mouth daily.         Objective:   BP 109/75   Pulse 77   Temp (!) 97.1 F (36.2 C)   Ht 5\' 8"  (1.727 m)   Wt 194 lb (88 kg)   SpO2 96%   BMI 29.50 kg/m   Wt Readings from Last 3 Encounters:  03/18/22 194 lb (88 kg)  12/10/21 194 lb (88 kg)  09/20/21 194 lb (88 kg)    Physical Exam Vitals and nursing note reviewed.  Constitutional:      General: She is not in acute distress.    Appearance: She is well-developed. She is not diaphoretic.  Eyes:     Conjunctiva/sclera: Conjunctivae normal.  Cardiovascular:     Rate and Rhythm: Normal rate and regular rhythm.     Heart sounds: Normal heart sounds. No murmur  heard. Pulmonary:     Effort: Pulmonary effort is normal. No respiratory distress.     Breath sounds: Normal breath sounds. No wheezing.  Musculoskeletal:        General: No swelling. Normal range of motion.  Skin:    General: Skin is warm and dry.     Findings: No rash.  Neurological:     Mental Status: She is alert and oriented to person, place, and time.     Coordination: Coordination normal.  Psychiatric:        Behavior: Behavior normal.       Assessment & Plan:   Problem List Items Addressed This Visit       Cardiovascular and Mediastinum   Hypertension - Primary     Endocrine   Hypothyroidism   Diabetes mellitus, type 2 (HCC)  Other   Mixed hyperlipidemia   Spinal stenosis at L4-L5 level   Relevant Medications   traMADol (ULTRAM) 50 MG tablet    Continue current medicine, sent refills for.  She is seeing a back doctor for possible injections which hopefully should help her as well. Follow up plan: Return in about 6 months (around 09/16/2022), or if symptoms worsen or fail to improve, for Diabetes and hypertension and thyroid.  Counseling provided for all of the vaccine components No orders of the defined types were placed in this encounter.   Arville Care, MD [facility] 03/18/2022, 8:24 AM

## 2022-04-05 DIAGNOSIS — M5416 Radiculopathy, lumbar region: Secondary | ICD-10-CM | POA: Diagnosis not present

## 2022-04-19 DIAGNOSIS — Z6829 Body mass index (BMI) 29.0-29.9, adult: Secondary | ICD-10-CM | POA: Diagnosis not present

## 2022-04-19 DIAGNOSIS — M5416 Radiculopathy, lumbar region: Secondary | ICD-10-CM | POA: Diagnosis not present

## 2022-05-09 ENCOUNTER — Telehealth: Payer: BC Managed Care – PPO | Admitting: Family Medicine

## 2022-05-09 ENCOUNTER — Encounter: Payer: Self-pay | Admitting: Family Medicine

## 2022-05-09 DIAGNOSIS — U071 COVID-19: Secondary | ICD-10-CM

## 2022-05-09 MED ORDER — NIRMATRELVIR/RITONAVIR (PAXLOVID)TABLET: 3.0000 | ORAL_TABLET | Freq: Two times a day (BID) | ORAL | 0 refills | Status: AC

## 2022-05-09 MED ORDER — BENZONATATE 100 MG PO CAPS: 100.0000 mg | ORAL_CAPSULE | Freq: Three times a day (TID) | ORAL | 0 refills | Status: DC | PRN

## 2022-05-09 MED ORDER — FLUTICASONE PROPIONATE 50 MCG/ACT NA SUSP: 1.0000 | Freq: Two times a day (BID) | NASAL | 1 refills | Status: AC | PRN

## 2022-05-09 NOTE — Progress Notes (Signed)
Virtual Visit via mychart video Note  I connected with [name] on 05/09/22 at 1333 by video and verified that I am speaking with the correct person using two identifiers. [name] is currently located at home and patient are currently with her during visit. The provider, Fransisca Kaufmann Cliffard Hair, MD is located in their office at time of visit.  Call ended at 1342  I discussed the limitations, risks, security and privacy concerns of performing an evaluation and management service by video and the availability of in person appointments. I also discussed with the patient that there may be a patient responsible charge related to this service. The patient expressed understanding and agreed to proceed.   History and Present Illness: Patient tested positive for covid.  Her husband was sick first and he was positive for covid. She started symptoms herself yesterday.  She has body aches and chest congestion. No fever yet. She is coughing and is taking mucinex and robitussin. She has used ibuprofen and it helped some.  Her husband has gotten a lot better with the antiviral medicine.  She denies any shortness of breath or wheezing but does have a lot of nasal congestion.  1. COVID-19 virus infection     Outpatient Encounter Medications as of 05/09/2022  Medication Sig   benzonatate (TESSALON PERLES) 100 MG capsule Take 1 capsule (100 mg total) by mouth 3 (three) times daily as needed for cough.   fluticasone (FLONASE) 50 MCG/ACT nasal spray Place 1 spray into both nostrils 2 (two) times daily as needed for allergies or rhinitis.   nirmatrelvir/ritonavir (PAXLOVID) 20 x 150 MG & 10 x 100MG  TABS Take 3 tablets by mouth 2 (two) times daily for 5 days. (Take nirmatrelvir 150 mg two tablets twice daily for 5 days and ritonavir 100 mg one tablet twice daily for 5 days) Patient GFR is 88   Biotin 5000 MCG TABS Take 10,000 mcg daily by mouth.    Calcium Carbonate-Vit D-Min (CALCIUM 1200 PO) Take by mouth.    Cholecalciferol (VITAMIN D PO) Take 1 tablet by mouth daily.   co-enzyme Q-10 30 MG capsule Take 30 mg by mouth daily.   conjugated estrogens (PREMARIN) vaginal cream Use 0.5 gm in vagina at hs for 2 weeks then 2-3 x weekly   esomeprazole (NEXIUM) 40 MG capsule Take 40 mg by mouth daily before breakfast.   gabapentin (NEURONTIN) 300 MG capsule TAKE ONE CAPSULE EVERY MORNING AND TWO AT BEDTIME   levothyroxine (SYNTHROID, LEVOTHROID) 88 MCG tablet Take 88 mcg by mouth every evening.    lisinopril (PRINIVIL,ZESTRIL) 10 MG tablet Take 10 mg by mouth every morning.    metFORMIN (GLUCOPHAGE-XR) 500 MG 24 hr tablet Take 1,000 mg 3 (three) times daily by mouth.    methocarbamol (ROBAXIN) 750 MG tablet Take 1 tablet (750 mg total) by mouth at bedtime as needed for muscle spasms.   metoprolol tartrate (LOPRESSOR) 25 MG tablet Take 25 mg by mouth 2 (two) times daily.    multivitamin-lutein (OCUVITE-LUTEIN) CAPS capsule Take 1 capsule by mouth daily.   pramipexole (MIRAPEX) 0.125 MG tablet Take 1 tablet (0.125 mg total) by mouth daily after supper.   rosuvastatin (CRESTOR) 5 MG tablet Take 5 mg by mouth at bedtime.    spironolactone (ALDACTONE) 25 MG tablet Take 12.5 mg by mouth daily.   traMADol (ULTRAM) 50 MG tablet Take 1 tablet (50 mg total) by mouth at bedtime as needed.   No facility-administered encounter medications on file  as of 05/09/2022.    Review of Systems  Constitutional:  Negative for chills and fever.  HENT:  Positive for congestion, postnasal drip, rhinorrhea and sinus pressure. Negative for ear discharge, ear pain, sneezing and sore throat.   Eyes:  Negative for pain, redness and visual disturbance.  Respiratory:  Positive for cough. Negative for chest tightness, shortness of breath and wheezing.   Cardiovascular:  Negative for chest pain and leg swelling.  Genitourinary:  Negative for difficulty urinating and dysuria.  Musculoskeletal:  Positive for myalgias. Negative for back pain  and gait problem.  Skin:  Negative for rash.  Neurological:  Negative for light-headedness and headaches.  Psychiatric/Behavioral:  Negative for agitation and behavioral problems.   All other systems reviewed and are negative.   Observations/Objective: Patient sounds and looks comfortable and in no acute distress  Assessment and Plan: Problem List Items Addressed This Visit   None Visit Diagnoses     COVID-19 virus infection    -  Primary   Relevant Medications   nirmatrelvir/ritonavir (PAXLOVID) 20 x 150 MG & 10 x 100MG  TABS   benzonatate (TESSALON PERLES) 100 MG capsule       Will give Paxlovid and Tessalon Perles and Flonase and then she can keep using Mucinex and Tylenol and ibuprofen. Follow up plan: Return if symptoms worsen or fail to improve.     I discussed the assessment and treatment plan with the patient. The patient was provided an opportunity to ask questions and all were answered. The patient agreed with the plan and demonstrated an understanding of the instructions.   The patient was advised to call back or seek an in-person evaluation if the symptoms worsen or if the condition fails to improve as anticipated.  The above assessment and management plan was discussed with the patient. The patient verbalized understanding of and has agreed to the management plan. Patient is aware to call the clinic if symptoms persist or worsen. Patient is aware when to return to the clinic for a follow-up visit. Patient educated on when it is appropriate to go to the emergency department.    I provided 9 minutes of non-face-to-face time during this encounter.    , MD

## 2022-05-30 ENCOUNTER — Other Ambulatory Visit: Payer: Self-pay | Admitting: Family Medicine

## 2022-05-30 DIAGNOSIS — G2581 Restless legs syndrome: Secondary | ICD-10-CM

## 2022-05-30 DIAGNOSIS — M48061 Spinal stenosis, lumbar region without neurogenic claudication: Secondary | ICD-10-CM

## 2022-06-14 DIAGNOSIS — M1711 Unilateral primary osteoarthritis, right knee: Secondary | ICD-10-CM | POA: Diagnosis not present

## 2022-07-06 ENCOUNTER — Encounter: Payer: Self-pay | Admitting: *Deleted

## 2022-07-12 DIAGNOSIS — M1711 Unilateral primary osteoarthritis, right knee: Secondary | ICD-10-CM | POA: Diagnosis not present

## 2022-07-12 LAB — LAB REPORT - SCANNED
A1c: 7.2
EGFR: 90

## 2022-07-19 DIAGNOSIS — M1711 Unilateral primary osteoarthritis, right knee: Secondary | ICD-10-CM | POA: Diagnosis not present

## 2022-07-26 DIAGNOSIS — M1711 Unilateral primary osteoarthritis, right knee: Secondary | ICD-10-CM | POA: Diagnosis not present

## 2022-08-02 ENCOUNTER — Other Ambulatory Visit: Payer: Self-pay | Admitting: Family Medicine

## 2022-08-02 DIAGNOSIS — M48061 Spinal stenosis, lumbar region without neurogenic claudication: Secondary | ICD-10-CM

## 2022-08-04 ENCOUNTER — Other Ambulatory Visit: Payer: Self-pay | Admitting: Family Medicine

## 2022-08-04 DIAGNOSIS — M48061 Spinal stenosis, lumbar region without neurogenic claudication: Secondary | ICD-10-CM

## 2022-08-04 NOTE — Telephone Encounter (Signed)
Appt made for Fri 08/11/21 for RF and discussion of medication

## 2022-08-04 NOTE — Telephone Encounter (Signed)
  Prescription Request  08/04/2022  Is this a "Controlled Substance" medicine?   Have you seen your PCP in the last 2 weeks? Has appt 5/22   If YES, route message to pool  -  If NO, patient needs to be scheduled for appointment.  What is the name of the medication or equipment?  traMADol (ULTRAM) 50 MG tablet   Have you contacted your pharmacy to request a refill? Yes, aware that she needs an appt before she can have a refill. Appt was made for 5/17 for a 6 month check at her appt on 11/17 but was canceled due to provider being on vacation   Which pharmacy would you like this sent to?  [place], [place]      Patient notified that their request is being sent to the clinical staff for review and that they should receive a response within 2 business days.

## 2022-08-04 NOTE — Telephone Encounter (Signed)
Yes I gave her 30 with 2 refills because she does not use it daily and only uses it infrequently.  I cannot predict when she is going to run out but it is in the signed contract that she does need to come in if she wants refills, even if it sooner than 6 months, if this did not last her 6 months then we can always discuss more in the future but since she does not use it on a regular every day I cannot predict when she will be due.  I do have same-day opening available starting next week if she needs to be seen sooner we can move her appointment up

## 2022-08-04 NOTE — Telephone Encounter (Signed)
Yes, aware that she needs an appt before she can have a refill. Appt was made for 5/17 for a 6 month check at her appt on 11/17 but was canceled due to provider being on vacation   This is the message I send w/ refill request from pharmacy the other day: Last OV 03/18/22 RTC 6 mos. Last RF 03/18/22 #30 w/ 2 RFs. Next OV 09/21/22

## 2022-08-12 ENCOUNTER — Ambulatory Visit: Payer: BC Managed Care – PPO | Admitting: Family Medicine

## 2022-08-12 ENCOUNTER — Encounter: Payer: Self-pay | Admitting: Family Medicine

## 2022-08-12 VITALS — BP 139/87 | HR 104 | Ht 68.0 in | Wt 192.0 lb

## 2022-08-12 DIAGNOSIS — F411 Generalized anxiety disorder: Secondary | ICD-10-CM

## 2022-08-12 DIAGNOSIS — E782 Mixed hyperlipidemia: Secondary | ICD-10-CM | POA: Diagnosis not present

## 2022-08-12 DIAGNOSIS — E1169 Type 2 diabetes mellitus with other specified complication: Secondary | ICD-10-CM | POA: Diagnosis not present

## 2022-08-12 DIAGNOSIS — I152 Hypertension secondary to endocrine disorders: Secondary | ICD-10-CM | POA: Diagnosis not present

## 2022-08-12 DIAGNOSIS — Z79899 Other long term (current) drug therapy: Secondary | ICD-10-CM

## 2022-08-12 DIAGNOSIS — Z7984 Long term (current) use of oral hypoglycemic drugs: Secondary | ICD-10-CM

## 2022-08-12 DIAGNOSIS — E039 Hypothyroidism, unspecified: Secondary | ICD-10-CM

## 2022-08-12 DIAGNOSIS — E1159 Type 2 diabetes mellitus with other circulatory complications: Secondary | ICD-10-CM

## 2022-08-12 DIAGNOSIS — Z79891 Long term (current) use of opiate analgesic: Secondary | ICD-10-CM | POA: Diagnosis not present

## 2022-08-12 DIAGNOSIS — M48061 Spinal stenosis, lumbar region without neurogenic claudication: Secondary | ICD-10-CM

## 2022-08-12 DIAGNOSIS — I10 Essential (primary) hypertension: Secondary | ICD-10-CM

## 2022-08-12 MED ORDER — TRAMADOL HCL 50 MG PO TABS
50.0000 mg | ORAL_TABLET | Freq: Every evening | ORAL | 5 refills | Status: DC | PRN
Start: 2022-08-12 — End: 2023-02-13

## 2022-08-12 MED ORDER — HYDROXYZINE PAMOATE 25 MG PO CAPS
25.0000 mg | ORAL_CAPSULE | Freq: Every evening | ORAL | 5 refills | Status: DC | PRN
Start: 2022-08-12 — End: 2023-08-09

## 2022-08-12 NOTE — Progress Notes (Signed)
BP 139/87   Pulse (!) 104   Ht  (1.727 m)   Wt 192 lb (87.1 kg)   SpO2 99%   BMI 29.19 kg/m    Subjective:   Patient ID: [name], female    DOB: 1950/07/06, 72 y.o.   MRN: [id number]  HPI: [name] is a 71 y.o. female presenting on 08/12/2022 for Medical Management of Chronic Issues, Hypertension, and Diabetes   HPI Pain assessment: Cause of pain-spinal stenosis of lumbar region Pain location-lower back Pain on scale of 1-10-moderate Frequency-daily, more frequently recently.  She is having a lot of shooting and nerve pain and numbness and she is going to see Dr. Yetta Barre her spinal doctor again. 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 -Has filled her prescription a little more frequently recently Pain contract signed on: Today  Hypertension Patient is currently on metoprolol and lisinopril, and their blood pressure today is 139/87, normally runs 110s. Patient denies any lightheadedness or dizziness. Patient denies headaches, blurred vision, chest pains, shortness of breath, or weakness. Denies any side effects from medication and is content with current medication.   Hypothyroidism recheck Patient is coming in for thyroid recheck today as well. They deny any issues with hair changes or heat or cold problems or diarrhea or constipation. They deny any chest pain or  palpitations. They are currently on levothyroxine 88 micrograms   Hyperlipidemia Patient is coming in for recheck of his hyperlipidemia. The patient is currently taking Crestor. They deny any issues with myalgias or history of liver damage from it. They deny any focal numbness or weakness or chest pain.   Type 2 diabetes mellitus Patient comes in today for recheck of his diabetes. Patient has been currently taking Rybelsus and metformin. Patient is currently on an ACE inhibitor/ARB. Patient has not seen an ophthalmologist this year. Patient denies any new issues with their feet. The symptom started onset as an adult hypertension and hyperlipidemia and hypothyroidism ARE RELATED TO DM   Relevant past medical, surgical, family and social history reviewed and updated as indicated. Interim medical history since our last visit reviewed. Allergies and medications reviewed and updated.  Review of Systems  Constitutional:  Negative for chills and fever.  Eyes:  Negative for visual disturbance.  Respiratory:  Negative for chest tightness and shortness of breath.   Cardiovascular:  Negative for chest pain and leg swelling.  Genitourinary:  Negative for difficulty urinating and dysuria.  Musculoskeletal:  Positive for arthralgias, back pain, joint swelling and myalgias. Negative for gait problem.  Skin:  Negative for rash.  Neurological:  Negative for light-headedness and headaches.  Psychiatric/Behavioral:  Negative for agitation and behavioral problems.   All other systems reviewed and are negative.   Per HPI unless specifically indicated above   Allergies as of 08/12/2022       Reactions   Dilaudid [hydromorphone Hcl] Nausea And Vomiting   Hydromorphone  Nausea And Vomiting        Medication List        Accurate as of August 12, 2022  4:38 PM. If you have any questions, ask your nurse or doctor.          benzonatate 100 MG capsule Commonly known as: Tessalon Perles Take 1 capsule  (100 mg total) by mouth 3 (three) times daily as needed for cough.   Biotin 5000 MCG Tabs Take 10,000 mcg daily by mouth.   CALCIUM 1200 PO Take by mouth.   co-enzyme Q-10 30 MG capsule Take 30 mg by mouth daily.   conjugated estrogens 0.625 MG/GM vaginal cream Commonly known as: Premarin Use 0.5 gm in vagina at hs for 2 weeks then 2-3 x weekly   esomeprazole 40 MG capsule Commonly known as: NEXIUM Take 40 mg by mouth daily before breakfast.   fluticasone 50 MCG/ACT nasal spray Commonly known as: FLONASE Place 1 spray into both nostrils 2 (two) times daily as needed for allergies or rhinitis.   gabapentin 300 MG capsule Commonly known as: NEURONTIN TAKE ONE CAPSULE EVERY MORNING AND TWO AT BEDTIME   hydrOXYzine 25 MG capsule Commonly known as: VISTARIL Take 1-2 capsules (25-50 mg total) by mouth at bedtime as needed. Started by: Nils Pyle, MD   levothyroxine 88 MCG tablet Commonly known as: SYNTHROID Take 88 mcg by mouth every evening.   lisinopril 10 MG tablet Commonly known as: ZESTRIL Take 10 mg by mouth every morning.   metFORMIN 500 MG 24 hr tablet Commonly known as: GLUCOPHAGE-XR Take 1,000 mg by mouth in the morning and at bedtime.   methocarbamol 750 MG tablet Commonly known as: ROBAXIN TAKE ONE TABLET AT BEDTIME AS NEEDED FOR MUSCLE SPASMS   metoprolol tartrate 25 MG tablet Commonly known as: LOPRESSOR Take 25 mg by mouth 2 (two) times daily.   multivitamin-lutein Caps capsule Take 1 capsule by mouth daily.   pramipexole 0.125 MG tablet Commonly known as: MIRAPEX TAKE 1 TABLET DAILY AFTER SUPPER   rosuvastatin 5 MG tablet Commonly known as: CRESTOR Take 5 mg by mouth at bedtime.   Rybelsus 7 MG Tabs Generic drug: Semaglutide Take 1 tablet by mouth daily.   spironolactone 25 MG tablet Commonly known as: ALDACTONE Take 12.5 mg by mouth daily.   traMADol 50 MG tablet Commonly known as: ULTRAM Take 1 tablet (50 mg total) by mouth  at bedtime as needed.   VITAMIN D PO Take 1 tablet by mouth daily.         Objective:   BP 139/87   Pulse (!) 104   Ht  (1.727 m)   Wt 192 lb (87.1 kg)   SpO2 99%   BMI 29.19 kg/m   Wt Readings from Last 3 Encounters:  08/12/22 192 lb (87.1 kg)  03/18/22 194 lb (88 kg)  12/10/21 194 lb (88 kg)    Physical Exam Vitals and nursing note reviewed.  Constitutional:      General: She is not in acute distress.    Appearance: She is well-developed. She is not diaphoretic.  Eyes:     Conjunctiva/sclera: Conjunctivae normal.  Cardiovascular:     Rate and Rhythm: Normal rate and regular rhythm.     Heart sounds: Normal heart sounds. No murmur heard. Pulmonary:     Effort: Pulmonary effort is normal. No respiratory distress.     Breath sounds: Normal breath sounds. No wheezing.  Musculoskeletal:  General: Tenderness present. No swelling. Normal range of motion.  Skin:    General: Skin is warm and dry.     Findings: No rash.  Neurological:     Mental Status: She is alert and oriented to person, place, and time.     Coordination: Coordination normal.  Psychiatric:        Behavior: Behavior normal.       Assessment & Plan:   Problem List Items Addressed This Visit       Cardiovascular and Mediastinum   Hypertension     Endocrine   Hypothyroidism   Diabetes mellitus, type 2 - Primary   Relevant Medications   RYBELSUS 7 MG TABS     Other   Mixed hyperlipidemia   GAD (generalized anxiety disorder)   Relevant Medications   hydrOXYzine (VISTARIL) 25 MG capsule   Spinal stenosis at L4-L5 level   Relevant Medications   traMADol (ULTRAM) 50 MG tablet   Other Visit Diagnoses     Controlled substance agreement signed       Relevant Orders   ToxASSURE Select 13 (MW), Urine       Increase tramadol to where she can take 1/day.  Also gave hydroxyzine to help with anxiety which is something that she has been struggling with with sleep and this should  help her sleep at night. Follow up plan: Return in about 6 months (around 02/11/2023), or if symptoms worsen or fail to improve, for Hypertension hypothyroidism and diabetes recheck.  Counseling provided for all of the vaccine components Orders Placed This Encounter  Procedures   ToxASSURE Select 13 (MW), Urine    Arville Care, MD [facility] 08/12/2022, 4:38 PM

## 2022-08-18 LAB — TOXASSURE SELECT 13 (MW), URINE

## 2022-09-02 ENCOUNTER — Ambulatory Visit: Payer: BC Managed Care – PPO | Admitting: Family Medicine

## 2022-09-02 ENCOUNTER — Ambulatory Visit (HOSPITAL_COMMUNITY)
Admission: RE | Admit: 2022-09-02 | Discharge: 2022-09-02 | Disposition: A | Discharge status: Home / Self Care | Payer: BC Managed Care – PPO | Source: Ambulatory Visit | Attending: Family Medicine | Admitting: Family Medicine

## 2022-09-02 ENCOUNTER — Encounter: Payer: Self-pay | Admitting: Family Medicine

## 2022-09-02 ENCOUNTER — Telehealth: Payer: Self-pay | Admitting: Family Medicine

## 2022-09-02 ENCOUNTER — Ambulatory Visit: Admission: RE | Admit: 2022-09-02 | Payer: BC Managed Care – PPO | Source: Ambulatory Visit

## 2022-09-02 VITALS — BP 129/84 | HR 74 | Ht 68.0 in | Wt 189.0 lb

## 2022-09-02 DIAGNOSIS — L819 Disorder of pigmentation, unspecified: Secondary | ICD-10-CM | POA: Diagnosis not present

## 2022-09-02 DIAGNOSIS — I700 Atherosclerosis of aorta: Secondary | ICD-10-CM | POA: Diagnosis not present

## 2022-09-02 DIAGNOSIS — R0989 Other specified symptoms and signs involving the circulatory and respiratory systems: Secondary | ICD-10-CM

## 2022-09-02 LAB — POCT I-STAT CREATININE: Creatinine, Ser: 0.7 mg/dL (ref 0.44–1.00)

## 2022-09-02 MED ORDER — IOHEXOL 350 MG/ML SOLN
125.0000 mL | Freq: Once | INTRAVENOUS | Status: AC | PRN
  Administered 2022-09-02: 125 mL via INTRAVENOUS

## 2022-09-02 NOTE — Progress Notes (Signed)
BP 129/84   Pulse 74   Ht 5\' 8"  (1.727 m)   Wt 189 lb (85.7 kg)   SpO2 96%   BMI 28.74 kg/m    Subjective:   Patient ID: [name], female    DOB: Sep 30, 1950, 72 y.o.   MRN: [id number]  HPI: [name] is a 72 y.o. female presenting on 09/02/2022 for Foot/toe concern (Right foot, right 4th toe. Pt denies injury. Purple and some swelling. Denies injury but on feet a lot yesterday.)   HPI Patient is coming in today for right fourth toe discoloration She says after work yesterday all of a sudden when she pulled her shoes off it was purple around the toe and now it spread up into her foot.  She denies any redness or warmth or pain in the foot or toe at all.  She denies any trauma and it does not hurt anywhere to push on it or to movement.  She says it is just discolored and purple in the toe and then going up into her foot and then be like pattern.  She has never had this before and it just arose yesterday.  Relevant past medical, surgical, family and social history reviewed and updated as indicated. Interim medical history since our last visit reviewed. Allergies and medications reviewed and updated.  Review of Systems  Constitutional:  Negative for chills and fever.  Eyes:  Negative for visual disturbance.  Respiratory:  Negative for chest tightness and shortness of breath.   Cardiovascular:  Negative for chest pain and leg swelling.  Musculoskeletal:  Negative for arthralgias, back pain and gait problem.  Skin:  Positive for color change. Negative for rash.  Neurological:  Negative for light-headedness and headaches.  Psychiatric/Behavioral:  Negative for agitation and behavioral problems.   All other systems reviewed and are negative.   Per HPI unless specifically indicated above   Allergies as of 09/02/2022       Reactions   Dilaudid [hydromorphone Hcl] Nausea And Vomiting   Hydromorphone Nausea And Vomiting        Medication List        Accurate as of Sep 02, 2022 10:11 AM. If you have any questions, ask your nurse or doctor.          benzonatate 100 MG capsule Commonly known as: Tessalon Perles Take 1 capsule (100 mg total) by mouth 3 (three) times daily as needed for cough.   Biotin 5000 MCG Tabs Take 10,000 mcg daily by mouth.   CALCIUM 1200 PO Take by mouth.   co-enzyme Q-10 30 MG capsule Take 30 mg by mouth daily.   conjugated estrogens 0.625 MG/GM vaginal cream Commonly known as: Premarin Use 0.5 gm in vagina at hs for 2 weeks then 2-3 x weekly   esomeprazole 40 MG capsule Commonly known as: NEXIUM Take 40 mg by mouth daily before breakfast.   fluticasone 50 MCG/ACT nasal spray Commonly known as: FLONASE Place 1 spray into both nostrils 2 (two) times daily as needed for allergies or rhinitis.   gabapentin 300 MG capsule Commonly known as: NEURONTIN TAKE ONE CAPSULE EVERY MORNING AND TWO AT BEDTIME   hydrOXYzine 25 MG capsule Commonly known as: VISTARIL Take 1-2 capsules (25-50 mg total) by mouth at bedtime as needed.   levothyroxine 88 MCG tablet Commonly known as: SYNTHROID Take 88 mcg by mouth every evening.   lisinopril 10 MG tablet Commonly known as: ZESTRIL Take 10 mg by mouth every morning.  metFORMIN 500 MG 24 hr tablet Commonly known as: GLUCOPHAGE-XR Take 1,000 mg by mouth in the morning and at bedtime.   methocarbamol 750 MG tablet Commonly known as: ROBAXIN TAKE ONE TABLET AT BEDTIME AS NEEDED FOR MUSCLE SPASMS   metoprolol tartrate 25 MG tablet Commonly known as: LOPRESSOR Take 25 mg by mouth 2 (two) times daily.   multivitamin-lutein Caps capsule Take 1 capsule by mouth daily.   pramipexole 0.125 MG tablet Commonly known as: MIRAPEX TAKE 1 TABLET DAILY AFTER SUPPER   rosuvastatin 5 MG tablet Commonly known as: CRESTOR Take 5 mg by mouth at bedtime.   Rybelsus 7 MG Tabs Generic drug: Semaglutide Take 1 tablet by mouth daily.   spironolactone 25 MG tablet Commonly known as:  ALDACTONE Take 12.5 mg by mouth daily.   traMADol 50 MG tablet Commonly known as: ULTRAM Take 1 tablet (50 mg total) by mouth at bedtime as needed.   VITAMIN D PO Take 1 tablet by mouth daily.         Objective:   BP 129/84   Pulse 74   Ht 5\' 8"  (1.727 m)   Wt 189 lb (85.7 kg)   SpO2 96%   BMI 28.74 kg/m   Wt Readings from Last 3 Encounters:  09/02/22 189 lb (85.7 kg)  08/12/22 192 lb (87.1 kg)  03/18/22 194 lb (88 kg)    Physical Exam Vitals and nursing note reviewed.  Skin:    General: Skin is warm and dry.     Capillary Refill: Capillary refill takes 2 to 3 seconds.     Coloration: Skin is mottled (Right fourth toe and a V-shaped pattern on her foot is mottled and discolored).     Findings: Ecchymosis present.     Comments: Nontender purple discoloration of right fourth toe extending onto the foot and V-shaped pattern.  Mild       Assessment & Plan:   Problem List Items Addressed This Visit   None Visit Diagnoses     Discoloration of skin of toe    -  Primary   Relevant Orders   CT ANGIO AO+BIFEM W & OR WO CONTRAST   Prolonged capillary refill time       Relevant Orders   CT ANGIO AO+BIFEM W & OR WO CONTRAST       Will order CT angio runoff to make sure that blood flow is fine.  Concern for possible ischemia.  If that is negative then maybe she did just bruised it and just not having pain with it. Follow up plan: Return if symptoms worsen or fail to improve.  Counseling provided for all of the vaccine components Orders Placed This Encounter  Procedures   CT ANGIO AO+BIFEM W & OR WO CONTRAST    Arville Care, MD [facility] 09/02/2022, 10:11 AM

## 2022-09-02 NOTE — Telephone Encounter (Signed)
Dettinger,  Please review and I will call pt back.

## 2022-09-02 NOTE — Telephone Encounter (Signed)
Spoke with patient and scheduled appointment with Dr. Louanne Skye for patient to come in to be seen this morning.

## 2022-09-06 DIAGNOSIS — M5416 Radiculopathy, lumbar region: Secondary | ICD-10-CM | POA: Diagnosis not present

## 2022-09-16 ENCOUNTER — Ambulatory Visit: Payer: Medicare Other | Admitting: Family Medicine

## 2022-09-20 ENCOUNTER — Encounter: Payer: Self-pay | Admitting: Family Medicine

## 2022-09-20 ENCOUNTER — Ambulatory Visit: Payer: BC Managed Care – PPO | Admitting: Family Medicine

## 2022-09-20 VITALS — BP 117/75 | HR 74 | Temp 98.6°F | Ht 68.0 in | Wt 187.0 lb

## 2022-09-20 DIAGNOSIS — L089 Local infection of the skin and subcutaneous tissue, unspecified: Secondary | ICD-10-CM

## 2022-09-20 MED ORDER — SULFAMETHOXAZOLE-TRIMETHOPRIM 800-160 MG PO TABS
1.0000 | ORAL_TABLET | Freq: Two times a day (BID) | ORAL | 0 refills | Status: AC
Start: 2022-09-20 — End: 2022-09-30

## 2022-09-20 MED ORDER — CEFTRIAXONE SODIUM 1 G IJ SOLR
1.0000 g | Freq: Once | INTRAMUSCULAR | Status: AC
Start: 2022-09-20 — End: 2022-09-20
  Administered 2022-09-20: 1 g via INTRAMUSCULAR

## 2022-09-20 NOTE — Progress Notes (Signed)
Subjective: CC: Spot on left knee PCP: Dettinger, Elige Radon, MD [name] is a 72 y.o. female presenting to clinic today for:  1.  Spot on left knee Patient reports couple day history of left-sided knee spot.  Initially it seems small and not very significant but it seems to be getting redder and more tender as well as slightly fuller.  She has new kittens which often scratched her right lower extremity but she does not recall being scratched on the left lower extremity.  Not sure if she sustained any type of insect bite or perhaps they did scratch her and she did not notice.  Regardless she is having some evidence of inflammation and pain at this time that is not relieved by over-the-counter products.  No reports of fevers.  No lymphadenopathy reported.   ROS: Per HPI  Allergies  Allergen Reactions   Dilaudid [Hydromorphone Hcl] Nausea And Vomiting   Hydromorphone Nausea And Vomiting   Past Medical History:  Diagnosis Date   Anxiety    Diabetes mellitus    type II    Dysrhythmia    tachycardia    GERD (gastroesophageal reflux disease)    Hyperlipidemia    Hypertension    Hypothyroidism    Neuromuscular disorder (HCC)    muscle cramps     Current Outpatient Medications:    benzonatate (TESSALON PERLES) 100 MG capsule, Take 1 capsule (100 mg total) by mouth 3 (three) times daily as needed for cough., Disp: 20 capsule, Rfl: 0   Biotin 5000 MCG TABS, Take 10,000 mcg daily by mouth. , Disp: , Rfl:    Calcium Carbonate-Vit D-Min (CALCIUM 1200 PO), Take by mouth., Disp: , Rfl:    Cholecalciferol (VITAMIN D PO), Take 1 tablet by mouth daily., Disp: , Rfl:    co-enzyme Q-10 30 MG capsule, Take 30 mg by mouth daily., Disp: , Rfl:    conjugated estrogens (PREMARIN) vaginal cream, Use 0.5 gm in vagina at hs for 2 weeks then 2-3 x weekly, Disp: 42.5 g, Rfl: 2   esomeprazole (NEXIUM) 40 MG capsule, Take 40 mg by mouth daily before breakfast., Disp: , Rfl:    fluticasone (FLONASE)  50 MCG/ACT nasal spray, Place 1 spray into both nostrils 2 (two) times daily as needed for allergies or rhinitis., Disp: 16 g, Rfl: 1   gabapentin (NEURONTIN) 300 MG capsule, TAKE ONE CAPSULE EVERY MORNING AND TWO AT BEDTIME, Disp: 90 capsule, Rfl: 1   hydrOXYzine (VISTARIL) 25 MG capsule, Take 1-2 capsules (25-50 mg total) by mouth at bedtime as needed., Disp: 60 capsule, Rfl: 5   levothyroxine (SYNTHROID, LEVOTHROID) 88 MCG tablet, Take 88 mcg by mouth every evening. , Disp: , Rfl:    lisinopril (PRINIVIL,ZESTRIL) 10 MG tablet, Take 10 mg by mouth every morning. , Disp: , Rfl:    metFORMIN (GLUCOPHAGE-XR) 500 MG 24 hr tablet, Take 1,000 mg by mouth in the morning and at bedtime., Disp: , Rfl:    methocarbamol (ROBAXIN) 750 MG tablet, TAKE ONE TABLET AT BEDTIME AS NEEDED FOR MUSCLE SPASMS, Disp: 90 tablet, Rfl: 3   metoprolol tartrate (LOPRESSOR) 25 MG tablet, Take 25 mg by mouth 2 (two) times daily. , Disp: , Rfl:    multivitamin-lutein (OCUVITE-LUTEIN) CAPS capsule, Take 1 capsule by mouth daily., Disp: , Rfl:    pramipexole (MIRAPEX) 0.125 MG tablet, TAKE 1 TABLET DAILY AFTER SUPPER, Disp: 30 tablet, Rfl: 3   rosuvastatin (CRESTOR) 5 MG tablet, Take 5 mg by mouth at  bedtime. , Disp: , Rfl:    RYBELSUS 7 MG TABS, Take 1 tablet by mouth daily., Disp: , Rfl:    spironolactone (ALDACTONE) 25 MG tablet, Take 12.5 mg by mouth daily., Disp: , Rfl:    traMADol (ULTRAM) 50 MG tablet, Take 1 tablet (50 mg total) by mouth at bedtime as needed., Disp: 30 tablet, Rfl: 5 Social History   Socioeconomic History   Marital status: Married    Spouse name: Not on file   Number of children: 2   Years of education: Not on file   Highest education level: Bachelor's degree (e.g., BA, AB, BS)  Occupational History   Occupation: Teacher, adult education: [employer]  Tobacco Use   Smoking status: Never   Smokeless tobacco: Never  Vaping Use   Vaping Use: Never used  Substance and Sexual Activity   Alcohol use: No    Drug use: No   Sexual activity: Yes    Birth control/protection: Surgical    Comment: hyst  Other Topics Concern   Not on file  Social History Narrative   Not on file   Social Determinants of Health   Financial Resource Strain: Low Risk  (08/08/2022)   Overall Financial Resource Strain (CARDIA)    Difficulty of Paying Living Expenses: Not very hard  Food Insecurity: No Food Insecurity (08/08/2022)   Hunger Vital Sign    Worried About Running Out of Food in the Last Year: Never true    Ran Out of Food in the Last Year: Never true  Transportation Needs: No Transportation Needs (08/08/2022)   PRAPARE - Administrator, Civil Service (Medical): No    Lack of Transportation (Non-Medical): No  Physical Activity: Sufficiently Active (08/08/2022)   Exercise Vital Sign    Days of Exercise per Week: 3 days    Minutes of Exercise per Session: 70 min  Stress: No Stress Concern Present (08/08/2022)   [employer] of Occupational Health - Occupational Stress Questionnaire    Feeling of Stress : Not at all  Social Connections: Socially Integrated (08/08/2022)   Social Connection and Isolation Panel [NHANES]    Frequency of Communication with Friends and Family: More than three times a week    Frequency of Social Gatherings with Friends and Family: More than three times a week    Attends Religious Services: More than 4 times per year    Active Member of [employer] or Organizations: Yes    Attends Banker Meetings: 1 to 4 times per year    Marital Status: Married  Catering manager Violence: Not on file   Family History  Problem Relation Age of Onset   Heart disease Mother    Heart disease Father    Colon cancer Father    Brain cancer Father    Lung cancer Maternal Uncle        smoked   Lung cancer Maternal Uncle        smoked    Objective: Office vital signs reviewed. BP 117/75   Pulse 74   Temp 98.6 F (37 C)   Ht 5\' 8"  (1.727 m)   Wt 187 lb (84.8 kg)   SpO2 96%    BMI 28.43 kg/m   Physical Examination:  General: Awake, alert, well nourished, No acute distress MSK:  Left knee: Lateral aspect of the knee over the insertion of the IT band with appreciable soft tissue swelling, erythema and warmth.  There is no palpable fluctuance or deformity  in this area otherwise.  No involvement of the joint.  She is ambulating independently.  Assessment/ Plan: 72 y.o. female   Soft tissue infection - Plan: cefTRIAXone (ROCEPHIN) injection 1 g, sulfamethoxazole-trimethoprim (BACTRIM DS) 800-160 MG tablet  ?  Soft tissue infection.  She is given a dose of Rocephin today.  I considered cat scratch disease but since she has no lymphadenopathy or fevers less likely to be so.  For this reason I elected to use Septra for treatment of soft tissue infection but this should also provide some coverage for any type of cat scratch issues.  I encouraged her to follow-up if she has any progression of symptoms or other red flag signs or symptoms develop.  She voiced good understanding and will follow-up as needed  No orders of the defined types were placed in this encounter.  No orders of the defined types were placed in this encounter.    Raliegh Ip, DO [facility] [phone]

## 2022-09-21 ENCOUNTER — Ambulatory Visit: Payer: Medicare Other | Admitting: Family Medicine

## 2022-09-22 DIAGNOSIS — R200 Anesthesia of skin: Secondary | ICD-10-CM | POA: Diagnosis not present

## 2022-09-22 DIAGNOSIS — R202 Paresthesia of skin: Secondary | ICD-10-CM | POA: Diagnosis not present

## 2022-09-22 DIAGNOSIS — M5416 Radiculopathy, lumbar region: Secondary | ICD-10-CM | POA: Diagnosis not present

## 2022-09-22 DIAGNOSIS — M545 Low back pain, unspecified: Secondary | ICD-10-CM | POA: Diagnosis not present

## 2022-09-27 DIAGNOSIS — M5416 Radiculopathy, lumbar region: Secondary | ICD-10-CM | POA: Diagnosis not present

## 2022-09-30 ENCOUNTER — Other Ambulatory Visit: Payer: Self-pay | Admitting: Family Medicine

## 2022-09-30 DIAGNOSIS — G2581 Restless legs syndrome: Secondary | ICD-10-CM

## 2022-10-25 LAB — LAB REPORT - SCANNED
A1c: 6.5
EGFR: 94

## 2022-12-01 ENCOUNTER — Ambulatory Visit (INDEPENDENT_AMBULATORY_CARE_PROVIDER_SITE_OTHER): Payer: BC Managed Care – PPO | Admitting: Family Medicine

## 2022-12-01 ENCOUNTER — Encounter: Payer: Self-pay | Admitting: Family Medicine

## 2022-12-01 ENCOUNTER — Ambulatory Visit (INDEPENDENT_AMBULATORY_CARE_PROVIDER_SITE_OTHER): Payer: BC Managed Care – PPO

## 2022-12-01 VITALS — BP 98/65 | HR 82 | Temp 97.8°F | Ht 68.0 in | Wt 186.0 lb

## 2022-12-01 DIAGNOSIS — R202 Paresthesia of skin: Secondary | ICD-10-CM

## 2022-12-01 DIAGNOSIS — M8930 Hypertrophy of bone, unspecified site: Secondary | ICD-10-CM

## 2022-12-01 DIAGNOSIS — M79605 Pain in left leg: Secondary | ICD-10-CM | POA: Diagnosis not present

## 2022-12-01 DIAGNOSIS — M48061 Spinal stenosis, lumbar region without neurogenic claudication: Secondary | ICD-10-CM

## 2022-12-01 DIAGNOSIS — R200 Anesthesia of skin: Secondary | ICD-10-CM

## 2022-12-01 DIAGNOSIS — M1712 Unilateral primary osteoarthritis, left knee: Secondary | ICD-10-CM | POA: Diagnosis not present

## 2022-12-01 MED ORDER — PREDNISONE 20 MG PO TABS
40.0000 mg | ORAL_TABLET | Freq: Every day | ORAL | 0 refills | Status: AC
Start: 2022-12-01 — End: 2022-12-06

## 2022-12-01 NOTE — Progress Notes (Signed)
Acute Office Visit  Subjective:  Patient ID: [name], female    DOB: October 06, 1950, 72 y.o.   MRN: [id number]  Chief Complaint  Patient presents with   left side pain hip-knee   HPI Patient is in today for left sided pain from her hip to knee joint. States that she has neuropathy for which she takes robaxin, gabapentin, ultram, and mirapex. However she has recently had pains from her hip to her knee that feel like "a thousand bee stings" Reports that pain is worse with light touch. Reports that while driving, touching the side of the car is very painful. Deep massage feels better. States that her knee joint feels "out of joint". Patient is followed by Dr. Turner Daniels at [facility] for her knees. She receives injections frequently for bilateral knee pain. She is also followed by Dr. Yetta Barre with [facility]. States that she had recent follow up with Dr. Yetta Barre for MRI of lumbar spine due to increased neuropathy. She is also concerned with "bone growth" on her left knee. Reports that it has been there fore several years. States that it is tender to touch.   ROS As per HPI   Objective:  BP 98/65   Pulse 82   Temp 97.8 F (36.6 C)   Ht 5\' 8"  (1.727 m)   Wt 186 lb (84.4 kg)   SpO2 96%   BMI 28.28 kg/m   Physical Exam Constitutional:      General: She is awake. She is not in acute distress.    Appearance: Normal appearance. She is well-developed and well-groomed. She is not ill-appearing, toxic-appearing or diaphoretic.  Cardiovascular:     Rate and Rhythm: Normal rate.     Pulses: Normal pulses.          Radial pulses are 2+ on the right side and 2+ on the left side.       Posterior tibial pulses are 2+ on the right side and 2+ on the left side.     Heart sounds: Normal heart sounds. No murmur heard.    No gallop.  Pulmonary:     Effort: Pulmonary effort is normal. No respiratory distress.     Breath sounds: Normal breath sounds. No stridor. No wheezing, rhonchi or  rales.  Musculoskeletal:     Cervical back: Full passive range of motion without pain and neck supple.     Left knee: Deformity and bony tenderness present. Tenderness present over the lateral joint line. No medial joint line tenderness. Normal pulse.     Right lower leg: No edema.     Left lower leg: No edema.       Legs:     Comments: Hard protrusion on left lateral lower knee   Skin:    General: Skin is warm.     Capillary Refill: Capillary refill takes less than 2 seconds.  Neurological:     General: No focal deficit present.     Mental Status: She is alert, oriented to person, place, and time and easily aroused. Mental status is at baseline.     GCS: GCS eye subscore is 4. GCS verbal subscore is 5. GCS motor subscore is 6.     Motor: No weakness.  Psychiatric:        Attention and Perception: Attention and perception normal.        Mood and Affect: Mood and affect normal.        Speech: Speech normal.  Behavior: Behavior normal. Behavior is cooperative.        Thought Content: Thought content normal. Thought content does not include homicidal or suicidal ideation. Thought content does not include homicidal or suicidal plan.        Cognition and Memory: Cognition and memory normal.        Judgment: Judgment normal.        12/01/2022    3:48 PM 09/20/2022    2:27 PM 09/02/2022    9:40 AM 08/12/2022    4:06 PM 03/18/2022    8:12 AM  Depression screen PHQ 2/9  Decreased Interest 0 0 0 0   Down, Depressed, Hopeless 0 0 0 0   PHQ - 2 Score 0 0 0 0   Altered sleeping 0 0 0 0 0  Tired, decreased energy 0 0 0 0 0  Change in appetite 0 0 0 0 0  Feeling bad or failure about yourself  0 0 0 0 0  Trouble concentrating 0 0 0 0 0  Moving slowly or fidgety/restless 0 0 0 0 0  Suicidal thoughts 0 0 0 0 0  PHQ-9 Score 0 0 0 0   Difficult doing work/chores Not difficult at all Not difficult at all Not difficult at all Not difficult at all Not difficult at all      12/01/2022     3:48 PM 09/20/2022    2:26 PM 09/02/2022    9:40 AM 08/12/2022    4:06 PM  GAD 7 : Generalized Anxiety Score  Nervous, Anxious, on Edge 0 0 0 0  Control/stop worrying 0 0 0 0  Worry too much - different things 0 0 0 0  Trouble relaxing 0 0 0 0  Restless 0 0 0 0  Easily annoyed or irritable 0 0 0 0  Afraid - awful might happen 0 0 0 0  Total GAD 7 Score 0 0 0 0  Anxiety Difficulty Not difficult at all Not difficult at all Not difficult at all Not difficult at all   Assessment & Plan:  1. Spinal stenosis of lumbar region, unspecified whether neurogenic claudication present Medication as below. Did not want to prescribe medication that was potentially sedating given that patient is currently taking multiple medications that cause sedation. Imaging as below. Will communicate results to patient once available. Will await results to determine next steps. Patient to follow up with ortho and neuro as well. Unable to view recent MRI.  - predniSONE (DELTASONE) 20 MG tablet; Take 2 tablets (40 mg total) by mouth daily with breakfast for 5 days.  Dispense: 10 tablet; Refill: 0 - DG Knee 1-2 Views Left  2. Pain of left lower extremity As above.  - predniSONE (DELTASONE) 20 MG tablet; Take 2 tablets (40 mg total) by mouth daily with breakfast for 5 days.  Dispense: 10 tablet; Refill: 0 - DG Knee 1-2 Views Left  3. Numbness and tingling of left lower extremity As above.  - predniSONE (DELTASONE) 20 MG tablet; Take 2 tablets (40 mg total) by mouth daily with breakfast for 5 days.  Dispense: 10 tablet; Refill: 0  4. Bony overgrowth Imaging as below. Will communicate results to patient once available. Will await results to determine next steps. Patient to follow up with ortho and neuro as well. Unable to view imaging of patient knee.  - DG Knee 1-2 Views Left  The above assessment and management plan was discussed with the patient. The patient verbalized understanding of and has  agreed to the management  plan using shared-decision making. Patient is aware to call the clinic if they develop any new symptoms or if symptoms fail to improve or worsen. Patient is aware when to return to the clinic for a follow-up visit. Patient educated on when it is appropriate to go to the emergency department.   Return if symptoms worsen or fail to improve.  Neale Burly, DNP-FNP [facility] [address] [place], [place] [address] [phone]

## 2022-12-02 ENCOUNTER — Encounter: Payer: Self-pay | Admitting: Family Medicine

## 2022-12-05 NOTE — Telephone Encounter (Signed)
When can nurse get pt in to see DR Dettinger for this apt? Please call back

## 2022-12-06 NOTE — Progress Notes (Signed)
No fracture or dislocation. States there are some degenerative changes, mainly on the lateral (outside) area. Does not identify cause of protrusion.

## 2022-12-06 NOTE — Telephone Encounter (Signed)
Called to get images read.

## 2022-12-29 DIAGNOSIS — R92323 Mammographic fibroglandular density, bilateral breasts: Secondary | ICD-10-CM | POA: Diagnosis not present

## 2022-12-29 DIAGNOSIS — Z1231 Encounter for screening mammogram for malignant neoplasm of breast: Secondary | ICD-10-CM | POA: Diagnosis not present

## 2022-12-29 LAB — HM MAMMOGRAPHY

## 2023-01-06 ENCOUNTER — Telehealth: Payer: Self-pay | Admitting: Family Medicine

## 2023-01-09 ENCOUNTER — Encounter: Payer: Self-pay | Admitting: Family Medicine

## 2023-01-09 DIAGNOSIS — Z0279 Encounter for issue of other medical certificate: Secondary | ICD-10-CM

## 2023-01-09 NOTE — Telephone Encounter (Signed)
I signed the form and will place it with nurse but if we can also make a note stating that she traded cars and lost her placard from that.

## 2023-01-09 NOTE — Telephone Encounter (Signed)
Sent mychart message to pt making aware of completion. Form up front in W folder.

## 2023-01-17 DIAGNOSIS — M1711 Unilateral primary osteoarthritis, right knee: Secondary | ICD-10-CM | POA: Diagnosis not present

## 2023-01-17 DIAGNOSIS — M170 Bilateral primary osteoarthritis of knee: Secondary | ICD-10-CM | POA: Diagnosis not present

## 2023-01-17 DIAGNOSIS — M1712 Unilateral primary osteoarthritis, left knee: Secondary | ICD-10-CM | POA: Diagnosis not present

## 2023-01-18 ENCOUNTER — Encounter: Payer: Self-pay | Admitting: Family Medicine

## 2023-01-24 LAB — LAB REPORT - SCANNED
A1c: 6.5
EGFR: 94

## 2023-01-31 ENCOUNTER — Encounter: Payer: Self-pay | Admitting: Family Medicine

## 2023-01-31 NOTE — Telephone Encounter (Signed)
I cannot discuss his care on her chart.  Glad to take a look into her concerns.

## 2023-02-06 ENCOUNTER — Other Ambulatory Visit: Payer: Self-pay | Admitting: Family Medicine

## 2023-02-06 DIAGNOSIS — M48061 Spinal stenosis, lumbar region without neurogenic claudication: Secondary | ICD-10-CM

## 2023-02-13 ENCOUNTER — Ambulatory Visit: Payer: BC Managed Care – PPO | Admitting: Family Medicine

## 2023-02-13 VITALS — BP 119/79 | HR 68 | Ht 68.0 in | Wt 188.0 lb

## 2023-02-13 DIAGNOSIS — E039 Hypothyroidism, unspecified: Secondary | ICD-10-CM | POA: Diagnosis not present

## 2023-02-13 DIAGNOSIS — E782 Mixed hyperlipidemia: Secondary | ICD-10-CM

## 2023-02-13 DIAGNOSIS — I10 Essential (primary) hypertension: Secondary | ICD-10-CM

## 2023-02-13 DIAGNOSIS — E1159 Type 2 diabetes mellitus with other circulatory complications: Secondary | ICD-10-CM

## 2023-02-13 DIAGNOSIS — E1169 Type 2 diabetes mellitus with other specified complication: Secondary | ICD-10-CM

## 2023-02-13 DIAGNOSIS — Z7984 Long term (current) use of oral hypoglycemic drugs: Secondary | ICD-10-CM

## 2023-02-13 DIAGNOSIS — M48061 Spinal stenosis, lumbar region without neurogenic claudication: Secondary | ICD-10-CM

## 2023-02-13 MED ORDER — TRAMADOL HCL 50 MG PO TABS: 50.0000 mg | ORAL_TABLET | Freq: Every evening | ORAL | 5 refills | Status: DC | PRN

## 2023-02-13 MED ORDER — GABAPENTIN 300 MG PO CAPS
300.0000 mg | ORAL_CAPSULE | Freq: Three times a day (TID) | ORAL | 1 refills | Status: DC
Start: 2023-02-13 — End: 2023-08-08

## 2023-02-13 NOTE — Progress Notes (Signed)
BP 119/79   Pulse 68   Ht 5\' 8"  (1.727 m)   Wt 188 lb (85.3 kg)   SpO2 96%   BMI 28.59 kg/m    Subjective:   Patient ID: [name], female    DOB: 1950/10/02, 72 y.o.   MRN: [id number]  HPI: [name] is a 72 y.o. female presenting on 02/13/2023 for Medical Management of Chronic Issues, Diabetes, and Hypertension   HPI Type 2 diabetes mellitus Patient comes in today for recheck of his diabetes. Patient has been currently taking metformin and Rybelsus. Patient is currently on an ACE inhibitor/ARB. Patient has seen an ophthalmologist this year. Patient denies any new issues with their feet. The symptom started onset as an adult hypertension and hyperlipidemia ARE RELATED TO DM   Hypertension Patient is currently on lisinopril and metoprolol, and their blood pressure today is 119/79. Patient denies any lightheadedness or dizziness. Patient denies headaches, blurred vision, chest pains, shortness of breath, or weakness. Denies any side effects from medication and is content with current medication.   Hyperlipidemia Patient is coming in for recheck of his hyperlipidemia. The patient is currently taking Crestor. They deny any issues with myalgias or history of liver damage from it. They deny any focal numbness or weakness or chest pain.   Hypothyroidism recheck Patient is coming in for thyroid recheck today as well. They deny any issues with hair changes or heat or cold problems or diarrhea or constipation. They deny any chest pain or palpitations. They are currently on levothyroxine 88 micrograms   Pain assessment: Cause of pain-spinal stenosis Pain location-lumbar and radicular pain, also takes gabapentin Pain on scale of 1-10- 5 Frequency-daily What increases pain-working and being on her feet What makes pain Better-medication Effects on ADL -minimal Any change in general medical condition-none  Current opioids rx-tramadol 50 mg nightly as needed # meds  rx-30/month Effectiveness of current meds-helps Adverse reactions from pain meds-none Morphine equivalent-5  Pill count performed-No Last drug screen -08/12/2022 ( high risk q58m, moderate risk q108m, low risk yearly ) Urine drug screen today- No Was the [facility] reviewed-yes  If yes were their any concerning findings? -None  Pain contract signed on: 06/16/2021  Relevant past medical, surgical, family and social history reviewed and updated as indicated. Interim medical history since our last visit reviewed. Allergies and medications reviewed and updated.  Review of Systems  Constitutional:  Negative for chills and fever.  Eyes:  Negative for redness and visual disturbance.  Respiratory:  Negative for chest tightness and shortness of breath.   Cardiovascular:  Negative for chest pain and leg swelling.  Genitourinary:  Negative for difficulty urinating and dysuria.  Musculoskeletal:  Positive for arthralgias and back pain. Negative for gait problem.  Skin:  Negative for rash.  Neurological:  Negative for dizziness, light-headedness and headaches.  Psychiatric/Behavioral:  Negative for agitation and behavioral problems.   All other systems reviewed and are negative.   Per HPI unless specifically indicated above   Allergies as of 02/13/2023       Reactions   Hydromorphone Nausea And Vomiting        Medication List        Accurate as of February 13, 2023  8:41 AM. If you have any questions, ask your nurse or doctor.          STOP taking these medications    esomeprazole 40 MG capsule Commonly known as: NEXIUM Stopped by: Elige Radon Dequann Vandervelden  TAKE these medications    Biotin 5000 MCG Tabs Take 10,000 mcg daily by mouth.   CALCIUM 1200 PO Take by mouth.   co-enzyme Q-10 30 MG capsule Take 30 mg by mouth daily.   conjugated estrogens 0.625 MG/GM vaginal cream Commonly known as: Premarin Use 0.5 gm in vagina at hs for 2 weeks then 2-3 x weekly    fluticasone 50 MCG/ACT nasal spray Commonly known as: FLONASE Place 1 spray into both nostrils 2 (two) times daily as needed for allergies or rhinitis.   gabapentin 300 MG capsule Commonly known as: NEURONTIN Take 1 capsule (300 mg total) by mouth 3 (three) times daily. What changed: See the new instructions. Changed by: Elige Radon Domingo Fuson   hydrOXYzine 25 MG capsule Commonly known as: VISTARIL Take 1-2 capsules (25-50 mg total) by mouth at bedtime as needed.   levothyroxine 88 MCG tablet Commonly known as: SYNTHROID Take 88 mcg by mouth every evening.   lisinopril 10 MG tablet Commonly known as: ZESTRIL Take 10 mg by mouth every morning.   metFORMIN 500 MG 24 hr tablet Commonly known as: GLUCOPHAGE-XR Take 1,000 mg by mouth in the morning and at bedtime.   methocarbamol 750 MG tablet Commonly known as: ROBAXIN TAKE ONE TABLET AT BEDTIME AS NEEDED FOR MUSCLE SPASMS   metoprolol tartrate 25 MG tablet Commonly known as: LOPRESSOR Take 25 mg by mouth 2 (two) times daily.   multivitamin-lutein Caps capsule Take 1 capsule by mouth daily.   pantoprazole 40 MG tablet Commonly known as: PROTONIX Take 40 mg by mouth daily.   pramipexole 0.125 MG tablet Commonly known as: MIRAPEX TAKE ONE TABLET DAILY AFTER SUPPER   rosuvastatin 5 MG tablet Commonly known as: CRESTOR Take 5 mg by mouth at bedtime.   Rybelsus 7 MG Tabs Generic drug: Semaglutide Take 1 tablet by mouth daily.   spironolactone 25 MG tablet Commonly known as: ALDACTONE Take 12.5 mg by mouth daily.   traMADol 50 MG tablet Commonly known as: ULTRAM Take 1 tablet (50 mg total) by mouth at bedtime as needed.   VITAMIN D PO Take 1 tablet by mouth daily.         Objective:   BP 119/79   Pulse 68   Ht 5\' 8"  (1.727 m)   Wt 188 lb (85.3 kg)   SpO2 96%   BMI 28.59 kg/m   Wt Readings from Last 3 Encounters:  02/13/23 188 lb (85.3 kg)  12/01/22 186 lb (84.4 kg)  09/20/22 187 lb (84.8 kg)     Physical Exam Vitals and nursing note reviewed.  Constitutional:      General: She is not in acute distress.    Appearance: She is well-developed. She is not diaphoretic.  Eyes:     Conjunctiva/sclera: Conjunctivae normal.  Cardiovascular:     Rate and Rhythm: Normal rate and regular rhythm.     Heart sounds: Normal heart sounds. No murmur heard. Pulmonary:     Effort: Pulmonary effort is normal. No respiratory distress.     Breath sounds: Normal breath sounds. No wheezing.  Musculoskeletal:        General: No swelling.  Skin:    General: Skin is warm and dry.     Findings: No rash.  Neurological:     Mental Status: She is alert and oriented to person, place, and time.     Coordination: Coordination normal.  Psychiatric:        Behavior: Behavior normal.  Assessment & Plan:   Problem List Items Addressed This Visit       Cardiovascular and Mediastinum   Hypertension   Relevant Orders   CBC with Differential/Platelet   CMP14+EGFR   Lipid panel   TSH   Bayer DCA Hb A1c Waived     Endocrine   Hypothyroidism   Relevant Orders   CBC with Differential/Platelet   CMP14+EGFR   Lipid panel   TSH   Bayer DCA Hb A1c Waived   Diabetes mellitus, type 2 (HCC) - Primary   Relevant Orders   CBC with Differential/Platelet   CMP14+EGFR   Lipid panel   TSH   Bayer DCA Hb A1c Waived     Other   Mixed hyperlipidemia   Relevant Orders   CBC with Differential/Platelet   CMP14+EGFR   Lipid panel   TSH   Bayer DCA Hb A1c Waived   Spinal stenosis at L4-L5 level   Relevant Medications   gabapentin (NEURONTIN) 300 MG capsule   traMADol (ULTRAM) 50 MG tablet    Patient had blood work done at work and A1c and everything looked pretty good at work.  TSH was slightly off at 5.2, A1c was 6.5.  Will scan the results into the chart Follow up plan: Return in about 3 months (around 05/16/2023), or if symptoms worsen or fail to improve, for Diabetes recheck.  Counseling  provided for all of the vaccine components Orders Placed This Encounter  Procedures   CBC with Differential/Platelet   CMP14+EGFR   Lipid panel   TSH   Bayer DCA Hb A1c Waived    Arville Care, MD [facility] 02/13/2023, 8:41 AM

## 2023-02-27 DIAGNOSIS — Z23 Encounter for immunization: Secondary | ICD-10-CM | POA: Diagnosis not present

## 2023-02-28 ENCOUNTER — Other Ambulatory Visit: Payer: Self-pay | Admitting: Family Medicine

## 2023-02-28 DIAGNOSIS — G2581 Restless legs syndrome: Secondary | ICD-10-CM

## 2023-03-01 DIAGNOSIS — E119 Type 2 diabetes mellitus without complications: Secondary | ICD-10-CM | POA: Diagnosis not present

## 2023-03-01 LAB — HM DIABETES EYE EXAM

## 2023-03-10 DIAGNOSIS — M792 Neuralgia and neuritis, unspecified: Secondary | ICD-10-CM | POA: Diagnosis not present

## 2023-03-10 DIAGNOSIS — M2041 Other hammer toe(s) (acquired), right foot: Secondary | ICD-10-CM | POA: Diagnosis not present

## 2023-03-10 DIAGNOSIS — M25571 Pain in right ankle and joints of right foot: Secondary | ICD-10-CM | POA: Diagnosis not present

## 2023-03-10 DIAGNOSIS — M25572 Pain in left ankle and joints of left foot: Secondary | ICD-10-CM | POA: Diagnosis not present

## 2023-03-22 ENCOUNTER — Encounter: Payer: Self-pay | Admitting: *Deleted

## 2023-04-19 NOTE — Telephone Encounter (Signed)
Care team updated. Already abstracted in HM

## 2023-05-11 DIAGNOSIS — Z6829 Body mass index (BMI) 29.0-29.9, adult: Secondary | ICD-10-CM | POA: Diagnosis not present

## 2023-05-11 DIAGNOSIS — M5416 Radiculopathy, lumbar region: Secondary | ICD-10-CM | POA: Diagnosis not present

## 2023-05-17 ENCOUNTER — Other Ambulatory Visit: Payer: Self-pay

## 2023-05-17 ENCOUNTER — Ambulatory Visit: Payer: BC Managed Care – PPO | Attending: Neurological Surgery

## 2023-05-17 DIAGNOSIS — M6281 Muscle weakness (generalized): Secondary | ICD-10-CM | POA: Insufficient documentation

## 2023-05-17 DIAGNOSIS — M5416 Radiculopathy, lumbar region: Secondary | ICD-10-CM | POA: Insufficient documentation

## 2023-05-17 NOTE — Therapy (Signed)
 OUTPATIENT PHYSICAL THERAPY THORACOLUMBAR EVALUATION   Patient Name: [name] MRN: [id number] DOB:09/09/1950, 73 y.o., female Today's Date: 05/17/2023  END OF SESSION:  PT End of Session - 05/17/23 1515     Visit Number 1    Number of Visits 8    Date for PT Re-Evaluation 07/28/23    PT Start Time 1518    PT Stop Time 1556    PT Time Calculation (min) 38 min    Activity Tolerance Patient tolerated treatment well    Behavior During Therapy WFL for tasks assessed/performed             Past Medical History:  Diagnosis Date   Anxiety    Diabetes mellitus    type II    Dysrhythmia    tachycardia    GERD (gastroesophageal reflux disease)    Hyperlipidemia    Hypertension    Hypothyroidism    Neuromuscular disorder (HCC)    muscle cramps    Past Surgical History:  Procedure Laterality Date   APPENDECTOMY     CARPAL TUNNEL RELEASE     CHOLECYSTECTOMY     KNEE ARTHROSCOPY     left   LUMBAR LAMINECTOMY/DECOMPRESSION MICRODISCECTOMY N/A 03/15/2017   Procedure: Microlumbar decompression L4-5, L5-S1;  Surgeon: Orvan Blanch, MD;  Location: [facility];  Service: Orthopedics;  Laterality: N/A;  150 mins   PARTIAL HYSTERECTOMY     Patient Active Problem List   Diagnosis Date Noted   Osteopenia of lumbar spine 04/22/2021   Spinal stenosis of lumbar region 03/15/2017   Spinal stenosis at L4-L5 level 03/15/2017   Atrophic vaginitis 02/20/2016   Hypothyroidism 09/10/2014   GERD (gastroesophageal reflux disease) 09/10/2014   Vitamin D deficiency 09/10/2014   Diabetes mellitus, type 2 (HCC) 09/10/2014   GAD (generalized anxiety disorder) 09/10/2014   Obesity, unspecified 03/26/2013   Mixed hyperlipidemia 03/26/2013   Pulmonary nodule 09/22/2011   Hypertension 09/22/2011    PCP: Dettinger, Lucio Sabin, MD  REFERRING PROVIDER: Joaquin Mulberry, MD  REFERRING DIAG: Radiculopathy, lumbar region  Rationale for Evaluation and Treatment: Rehabilitation  THERAPY DIAG:   Radiculopathy, lumbar region  Muscle weakness (generalized)  ONSET DATE: 2018  SUBJECTIVE:                                                                                                                                                                                           SUBJECTIVE STATEMENT: Patient that her hip and right leg have been bothering her since she had surgery in 2018. However, it has slowly been getting worse as she has begun to lose use of her right leg. She  is scheduled to get an injection later this month. She has noticed that her symptoms gets worse as the day goes on. She also has severe cramps throughout the night if she does not take her medication between 5-6 PM at night.   PERTINENT HISTORY:  Hypertension, diabetes type 2, osteopenia, anxiety, and previous lumbar surgery  PAIN:  Are you having pain? Yes: NPRS scale: Current: 5/10 Best: 1-2/10 Worst: 10/10 Pain location: right leg Pain description: tingling and aching Aggravating factors: sitting or standing (10-15 minutes at most)  Relieving factors: medication   PRECAUTIONS: None  RED FLAGS: None   WEIGHT BEARING RESTRICTIONS: No  FALLS:  Has patient fallen in last 6 months? No  LIVING ENVIRONMENT: Lives with: lives with their spouse Lives in: House/apartment Stairs: Yes: Internal: 10-12 steps; can reach both; step to pattern Has following equipment at home: None  OCCUPATION: nurse at [employer]   PLOF: Independent  PATIENT GOALS: improved strength in her right leg  NEXT MD VISIT: 05/29/23 (injection)   OBJECTIVE:  Note: Objective measures were completed at Evaluation unless otherwise noted.  PATIENT SURVEYS:  Modified Oswestry 48% disability   COGNITION: Overall cognitive status: Within functional limits for tasks assessed     SENSATION: Light touch: Impaired with diminished sensation at L2,L4-S1 dermatomes Patient reports tingling in both feet.  LUMBAR ROM: repeated extension (10x):  8-9/10 prior and 7/10 after extension  AROM eval  Flexion 70  Extension 22; tingling radiating from the heel to the posterior right thigh  Right lateral flexion   Left lateral flexion   Right rotation 50% limited; felt burning in left thigh  Left rotation 50% limited; felt burning in left thigh   (Blank rows = not tested)  LOWER EXTREMITY ROM: WFL for activities assessed  LOWER EXTREMITY MMT:    MMT Right eval Left eval  Hip flexion 3/5 4-/5  Hip extension    Hip abduction    Hip adduction    Hip internal rotation    Hip external rotation    Knee flexion 3+/5 4-/5  Knee extension 3/5 4/5  Ankle dorsiflexion 3+/5 3+/5  Ankle plantarflexion    Ankle inversion    Ankle eversion     (Blank rows = not tested)  GAIT: Assistive device utilized: None Level of assistance: Complete Independence Comments: decreased gait speed and stride length  TREATMENT DATE:                                                                                                                                  PATIENT EDUCATION:  Education details: Plan of care, prognosis, referred pain, objective findings, and goals for physical therapy Person educated: Patient Education method: Explanation Education comprehension: verbalized understanding  HOME EXERCISE PROGRAM:   ASSESSMENT:  CLINICAL IMPRESSION: Patient is a 73 y.o. female who was seen today for physical therapy evaluation and treatment for right lumbar radiculopathy. She presented with moderate to high pain  severity and irritability with lumbar active range of motion being the most aggravating to her familiar symptoms. She also exhibited reduced right lower extremity muscular strength compared to the left lower extremity. Recommend that she continue with skilled physical therapy to address her impairments to maximize her functional mobility.  OBJECTIVE IMPAIRMENTS: Abnormal gait, decreased activity tolerance, decreased mobility, difficulty  walking, decreased ROM, decreased strength, and pain.   ACTIVITY LIMITATIONS: carrying, lifting, sitting, standing, sleeping, stairs, and locomotion level  PARTICIPATION LIMITATIONS: meal prep, cleaning, laundry, driving, shopping, community activity, and occupation  PERSONAL FACTORS: Past/current experiences, Time since onset of injury/illness/exacerbation, and 3+ comorbidities: Hypertension, diabetes type 2, osteopenia, anxiety, and previous lumbar surgery  are also affecting patient's functional outcome.   REHAB POTENTIAL: Fair    CLINICAL DECISION MAKING: Evolving/moderate complexity  EVALUATION COMPLEXITY: Moderate   GOALS: Goals reviewed with patient? Yes  LONG TERM GOALS: Target date: 06/14/23  Patient will be independent with her HEP. Baseline:  Goal status: INITIAL  2.  Patient will improve her right lower extremity muscular strength to at least 4-/5 throughout for improved functional mobility. Baseline:  Goal status: INITIAL  3.  Patient will improve her ODI score to 38% or less for improved perceived function with her daily activities. Baseline:  Goal status: INITIAL  4.  Patient will report being able to stand for at least 25 minutes for improved function with her critical job demands. Baseline:  Goal status: INITIAL  PLAN:  PT FREQUENCY: 2x/week  PT DURATION: 4 weeks  PLANNED INTERVENTIONS: 97164- PT Re-evaluation, 97110-Therapeutic exercises, 97530- Therapeutic activity, 97112- Neuromuscular re-education, 97535- Self Care, 64403- Manual therapy, [id number]- Gait training, 97014- Electrical stimulation (unattended), 97035- Ultrasound, 47425- Traction (mechanical), Patient/Family education, Balance training, Stair training, Dry Needling, Joint mobilization, Spinal mobilization, Cryotherapy, and Moist heat.  PLAN FOR NEXT SESSION: Nustep, lumbar and lower extremity strengthening, and modalities as needed   Lane Pinon, PT 05/17/2023, 4:36 PM

## 2023-05-22 ENCOUNTER — Ambulatory Visit: Payer: BC Managed Care – PPO

## 2023-05-22 DIAGNOSIS — M6281 Muscle weakness (generalized): Secondary | ICD-10-CM

## 2023-05-22 DIAGNOSIS — M5416 Radiculopathy, lumbar region: Secondary | ICD-10-CM | POA: Diagnosis not present

## 2023-05-22 NOTE — Therapy (Signed)
OUTPATIENT PHYSICAL THERAPY THORACOLUMBAR TREATMENT   Patient Name: [name] MRN: [id number] DOB:Aug 04, 1950, 73 y.o., female Today's Date: 05/22/2023  END OF SESSION:  PT End of Session - 05/22/23 1513     Visit Number 2    Number of Visits 8    Date for PT Re-Evaluation 07/28/23    PT Start Time 1515    PT Stop Time 1553    PT Time Calculation (min) 38 min    Activity Tolerance Patient tolerated treatment well    Behavior During Therapy WFL for tasks assessed/performed              Past Medical History:  Diagnosis Date   Anxiety    Diabetes mellitus    type II    Dysrhythmia    tachycardia    GERD (gastroesophageal reflux disease)    Hyperlipidemia    Hypertension    Hypothyroidism    Neuromuscular disorder (HCC)    muscle cramps    Past Surgical History:  Procedure Laterality Date   APPENDECTOMY     CARPAL TUNNEL RELEASE     CHOLECYSTECTOMY     KNEE ARTHROSCOPY     left   LUMBAR LAMINECTOMY/DECOMPRESSION MICRODISCECTOMY N/A 03/15/2017   Procedure: Microlumbar decompression L4-5, L5-S1;  Surgeon: Jene Every, MD;  Location: [facility];  Service: Orthopedics;  Laterality: N/A;  150 mins   PARTIAL HYSTERECTOMY     Patient Active Problem List   Diagnosis Date Noted   Osteopenia of lumbar spine 04/22/2021   Spinal stenosis of lumbar region 03/15/2017   Spinal stenosis at L4-L5 level 03/15/2017   Atrophic vaginitis 02/20/2016   Hypothyroidism 09/10/2014   GERD (gastroesophageal reflux disease) 09/10/2014   Vitamin D deficiency 09/10/2014   Diabetes mellitus, type 2 (HCC) 09/10/2014   GAD (generalized anxiety disorder) 09/10/2014   Obesity, unspecified 03/26/2013   Mixed hyperlipidemia 03/26/2013   Pulmonary nodule 09/22/2011   Hypertension 09/22/2011    PCP: Dettinger, Elige Radon, MD  REFERRING PROVIDER: Arman Bogus, MD  REFERRING DIAG: Radiculopathy, lumbar region  Rationale for Evaluation and Treatment: Rehabilitation  THERAPY DIAG:   Radiculopathy, lumbar region  Muscle weakness (generalized)  ONSET DATE: 2018  SUBJECTIVE:                                                                                                                                                                                           SUBJECTIVE STATEMENT: Patient reports that she is not hurting any today.   PERTINENT HISTORY:  Hypertension, diabetes type 2, osteopenia, anxiety, and previous lumbar surgery  PAIN:  Are you having pain? Yes: NPRS scale: Current:  0/10 Best: 1-2/10 Worst: 10/10 Pain location: right leg Pain description: tingling and aching Aggravating factors: sitting or standing (10-15 minutes at most)  Relieving factors: medication   PRECAUTIONS: None  RED FLAGS: None   WEIGHT BEARING RESTRICTIONS: No  FALLS:  Has patient fallen in last 6 months? No  LIVING ENVIRONMENT: Lives with: lives with their spouse Lives in: House/apartment Stairs: Yes: Internal: 10-12 steps; can reach both; step to pattern Has following equipment at home: None  OCCUPATION: nurse at [employer]   PLOF: Independent  PATIENT GOALS: improved strength in her right leg  NEXT MD VISIT: 05/29/23 (injection)   OBJECTIVE:  Note: Objective measures were completed at Evaluation unless otherwise noted.  PATIENT SURVEYS:  Modified Oswestry 48% disability   COGNITION: Overall cognitive status: Within functional limits for tasks assessed     SENSATION: Light touch: Impaired with diminished sensation at L2,L4-S1 dermatomes Patient reports tingling in both feet.  LUMBAR ROM: repeated extension (10x): 8-9/10 prior and 7/10 after extension  AROM eval  Flexion 70  Extension 22; tingling radiating from the heel to the posterior right thigh  Right lateral flexion   Left lateral flexion   Right rotation 50% limited; felt burning in left thigh  Left rotation 50% limited; felt burning in left thigh   (Blank rows = not tested)  LOWER EXTREMITY ROM:  WFL for activities assessed  LOWER EXTREMITY MMT:    MMT Right eval Left eval  Hip flexion 3/5 4-/5  Hip extension    Hip abduction    Hip adduction    Hip internal rotation    Hip external rotation    Knee flexion 3+/5 4-/5  Knee extension 3/5 4/5  Ankle dorsiflexion 3+/5 3+/5  Ankle plantarflexion    Ankle inversion    Ankle eversion     (Blank rows = not tested)  GAIT: Assistive device utilized: None Level of assistance: Complete Independence Comments: decreased gait speed and stride length  TREATMENT DATE:                                                                                                                                                                 05/22/23 EXERCISE LOG  Exercise Repetitions and Resistance Comments  Nustep  Attempted, but limited due to right knee pain   LAQ 3# x 2 minutes Alternating LE  Seated marching  3# x 3 minutes Alternating LE  Seated hip ADD isometric  3.5 minutes w/ 5 second hold   Seated clams  Green t-band x 3 minutes   Rocker board  5 minutes BUE support  Marching on foam  3 minutes  BUE support  Static stance on foam  3 minutes  Narrow BOS, eyes closed; intermittent minA for safety   Blank cell =  exercise not performed today    PATIENT EDUCATION:  Education details: Plan of care  Person educated: Patient Education method: Explanation Education comprehension: verbalized understanding  HOME EXERCISE PROGRAM:   ASSESSMENT:  CLINICAL IMPRESSION: Patient was introduced to multiple new interventions for improved lower extremity strength and muscular endurance. She required minimal cueing with today's new interventions for proper exercise performance. She was attempted to be introduced to the NuStep, but she was unable to complete this intervention due to right knee pain. She reported feeling alright upon the conclusion of treatment. She continues to require skilled physical therapy to address her remaining impairments to  return to her prior level of function.   OBJECTIVE IMPAIRMENTS: Abnormal gait, decreased activity tolerance, decreased mobility, difficulty walking, decreased ROM, decreased strength, and pain.   ACTIVITY LIMITATIONS: carrying, lifting, sitting, standing, sleeping, stairs, and locomotion level  PARTICIPATION LIMITATIONS: meal prep, cleaning, laundry, driving, shopping, community activity, and occupation  PERSONAL FACTORS: Past/current experiences, Time since onset of injury/illness/exacerbation, and 3+ comorbidities: Hypertension, diabetes type 2, osteopenia, anxiety, and previous lumbar surgery  are also affecting patient's functional outcome.   REHAB POTENTIAL: Fair    CLINICAL DECISION MAKING: Evolving/moderate complexity  EVALUATION COMPLEXITY: Moderate   GOALS: Goals reviewed with patient? Yes  LONG TERM GOALS: Target date: 06/14/23  Patient will be independent with her HEP. Baseline:  Goal status: INITIAL  2.  Patient will improve her right lower extremity muscular strength to at least 4-/5 throughout for improved functional mobility. Baseline:  Goal status: INITIAL  3.  Patient will improve her ODI score to 38% or less for improved perceived function with her daily activities. Baseline:  Goal status: INITIAL  4.  Patient will report being able to stand for at least 25 minutes for improved function with her critical job demands. Baseline:  Goal status: INITIAL  PLAN:  PT FREQUENCY: 2x/week  PT DURATION: 4 weeks  PLANNED INTERVENTIONS: 97164- PT Re-evaluation, 97110-Therapeutic exercises, 97530- Therapeutic activity, 97112- Neuromuscular re-education, 97535- Self Care, 95621- Manual therapy, [id number]- Gait training, 97014- Electrical stimulation (unattended), 97035- Ultrasound, 30865- Traction (mechanical), Patient/Family education, Balance training, Stair training, Dry Needling, Joint mobilization, Spinal mobilization, Cryotherapy, and Moist heat.  PLAN FOR NEXT  SESSION: Nustep, lumbar and lower extremity strengthening, and modalities as needed   Granville Lewis, PT 05/22/2023, 3:59 PM

## 2023-05-31 DIAGNOSIS — H43812 Vitreous degeneration, left eye: Secondary | ICD-10-CM | POA: Diagnosis not present

## 2023-06-01 ENCOUNTER — Other Ambulatory Visit: Payer: Self-pay | Admitting: Family Medicine

## 2023-06-01 DIAGNOSIS — M48061 Spinal stenosis, lumbar region without neurogenic claudication: Secondary | ICD-10-CM

## 2023-07-04 LAB — LAB REPORT - SCANNED
A1c: 6.6
EGFR: 96

## 2023-07-31 ENCOUNTER — Other Ambulatory Visit: Payer: Self-pay | Admitting: Family Medicine

## 2023-07-31 DIAGNOSIS — G2581 Restless legs syndrome: Secondary | ICD-10-CM

## 2023-08-09 ENCOUNTER — Ambulatory Visit: Payer: BC Managed Care – PPO | Admitting: Family Medicine

## 2023-08-09 ENCOUNTER — Other Ambulatory Visit

## 2023-08-09 ENCOUNTER — Encounter: Payer: Self-pay | Admitting: Family Medicine

## 2023-08-09 ENCOUNTER — Other Ambulatory Visit: Payer: Self-pay | Admitting: Family Medicine

## 2023-08-09 VITALS — BP 110/65 | HR 74 | Ht 68.0 in | Wt 192.0 lb

## 2023-08-09 DIAGNOSIS — I10 Essential (primary) hypertension: Secondary | ICD-10-CM | POA: Diagnosis not present

## 2023-08-09 DIAGNOSIS — E782 Mixed hyperlipidemia: Secondary | ICD-10-CM

## 2023-08-09 DIAGNOSIS — F411 Generalized anxiety disorder: Secondary | ICD-10-CM | POA: Diagnosis not present

## 2023-08-09 DIAGNOSIS — Z780 Asymptomatic menopausal state: Secondary | ICD-10-CM

## 2023-08-09 DIAGNOSIS — E1169 Type 2 diabetes mellitus with other specified complication: Secondary | ICD-10-CM | POA: Diagnosis not present

## 2023-08-09 DIAGNOSIS — Z79899 Other long term (current) drug therapy: Secondary | ICD-10-CM | POA: Diagnosis not present

## 2023-08-09 DIAGNOSIS — G2581 Restless legs syndrome: Secondary | ICD-10-CM

## 2023-08-09 DIAGNOSIS — M48061 Spinal stenosis, lumbar region without neurogenic claudication: Secondary | ICD-10-CM | POA: Diagnosis not present

## 2023-08-09 DIAGNOSIS — E039 Hypothyroidism, unspecified: Secondary | ICD-10-CM

## 2023-08-09 DIAGNOSIS — Z7984 Long term (current) use of oral hypoglycemic drugs: Secondary | ICD-10-CM

## 2023-08-09 MED ORDER — PRAMIPEXOLE DIHYDROCHLORIDE 0.125 MG PO TABS
0.1250 mg | ORAL_TABLET | Freq: Every day | ORAL | 3 refills | Status: AC
Start: 2023-08-09 — End: ?

## 2023-08-09 MED ORDER — TRAMADOL HCL 50 MG PO TABS: 50.0000 mg | ORAL_TABLET | Freq: Every evening | ORAL | 5 refills | Status: DC | PRN

## 2023-08-09 MED ORDER — HYDROXYZINE PAMOATE 25 MG PO CAPS: 25.0000 mg | ORAL_CAPSULE | Freq: Every evening | ORAL | 5 refills | Status: AC | PRN

## 2023-08-09 MED ORDER — GABAPENTIN 300 MG PO CAPS: 300.0000 mg | ORAL_CAPSULE | Freq: Three times a day (TID) | ORAL | 3 refills | Status: AC

## 2023-08-09 NOTE — Progress Notes (Signed)
 BP 110/65   Pulse 74   Ht 5\' 8"  (1.727 m)   Wt 192 lb (87.1 kg)   SpO2 98%   BMI 29.19 kg/m    Subjective:   Patient ID: [name], female    DOB: 07-Apr-1951, 73 y.o.   MRN: [id number]  HPI: [name] is a 73 y.o. female presenting on 08/09/2023 for Medical Management of Chronic Issues, Diabetes, and Hypertension   HPI Type 2 diabetes mellitus Patient comes in today for recheck of his diabetes. Patient has been currently taking metformin and Rybelsus. Patient is currently on an ACE inhibitor/ARB. Patient has not seen an ophthalmologist this year. Patient denies any new issues with their feet. The symptom started onset as an adult neuropathy and hypertension and hyperlipidemia and hypothyroidism ARE RELATED TO DM   Hypertension Patient is currently on lisinopril, and their blood pressure today is 110/65. Patient denies any lightheadedness or dizziness. Patient denies headaches, blurred vision, chest pains, shortness of breath, or weakness. Denies any side effects from medication and is content with current medication.   Hyperlipidemia Patient is coming in for recheck of his hyperlipidemia. The patient is currently taking Crestor. They deny any issues with myalgias or history of liver damage from it. They deny any focal numbness or weakness or chest pain.   Hypothyroidism recheck Patient is coming in for thyroid recheck today as well. They deny any issues with hair changes or heat or cold problems or diarrhea or constipation. They deny any chest pain or palpitations. They are currently on levothyroxine 88 micrograms   Anxiety Patient is coming in today for anxiety recheck.  She currently uses hydroxyzine and Mirapex to help with sleep as well.  She is dealing with a lot right now, with her company closing.  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  She also is going through divorce right now because her husband is getting back  with his ex-wife from before her.  She denies any suicide ideations or thoughts of hurting self.  She is working on talking with some people including family to help her through this process.  She feels like she is doing okay right now.    08/09/2023    8:47 AM 02/13/2023    8:31 AM 02/13/2023    8:30 AM 12/01/2022    3:48 PM 09/20/2022    2:27 PM  Depression screen PHQ 2/9  Decreased Interest 0  0 0 0  Down, Depressed, Hopeless 0  0 0 0  PHQ - 2 Score 0  0 0 0  Altered sleeping 0 0  0 0  Tired, decreased energy 0 0  0 0  Change in appetite 0 0  0 0  Feeling bad or failure about yourself  0 0  0 0  Trouble concentrating 0 0  0 0  Moving slowly or fidgety/restless 0 0  0 0  Suicidal thoughts 0 0  0 0  PHQ-9 Score 0   0 0  Difficult doing work/chores Not difficult at all Not difficult at all  Not difficult at all Not difficult at all     Pain assessment: Cause of pain-spinal stenosis lumbar Pain location-lower back and legs Pain on scale of 1-10- 4 Frequency-most days What increases pain-being on her feet on concrete floors What makes pain Better-rest and tramadol Effects on ADL -minimal Any change in general medical condition-none  Current opioids rx-tramadol  50 mg nightly as needed # meds rx-30 Effectiveness of current meds-works well Adverse reactions from pain meds-none Morphine equivalent-5  Pill count performed-No Last drug screen -06/16/2021 ( high risk q22m, moderate risk q62m, low risk yearly ) Urine drug screen today- Yes Was the NCCSR reviewed-yes  If yes were their any concerning findings? -None Pain contract signed on: Today  Relevant past medical, surgical, family and social history reviewed and updated as indicated. Interim medical history since our last visit reviewed. Allergies and medications reviewed and updated.  Review of Systems  Constitutional:  Negative for chills and fever.  HENT:  Negative for congestion, ear discharge and ear pain.   Eyes:   Negative for redness and visual disturbance.  Respiratory:  Negative for chest tightness and shortness of breath.   Cardiovascular:  Negative for chest pain and leg swelling.  Genitourinary:  Negative for difficulty urinating and dysuria.  Musculoskeletal:  Positive for arthralgias and back pain. Negative for gait problem.  Skin:  Negative for rash.  Neurological:  Negative for light-headedness and headaches.  Psychiatric/Behavioral:  Positive for dysphoric mood and sleep disturbance. Negative for agitation, behavioral problems, self-injury and suicidal ideas. The patient is nervous/anxious.   All other systems reviewed and are negative.   Per HPI unless specifically indicated above   Allergies as of 08/09/2023       Reactions   Hydromorphone Nausea And Vomiting        Medication List        Accurate as of August 09, 2023 10:25 AM. If you have any questions, ask your nurse or doctor.          Biotin 5000 MCG Tabs Take 10,000 mcg daily by mouth.   CALCIUM 1200 PO Take by mouth.   co-enzyme Q-10 30 MG capsule Take 30 mg by mouth daily.   conjugated estrogens 0.625 MG/GM vaginal cream Commonly known as: Premarin Use 0.5 gm in vagina at hs for 2 weeks then 2-3 x weekly   fluticasone 50 MCG/ACT nasal spray Commonly known as: FLONASE Place 1 spray into both nostrils 2 (two) times daily as needed for allergies or rhinitis.   gabapentin 300 MG capsule Commonly known as: NEURONTIN Take 1 capsule (300 mg total) by mouth 3 (three) times daily.   hydrOXYzine 25 MG capsule Commonly known as: VISTARIL Take 1-2 capsules (25-50 mg total) by mouth at bedtime as needed.   levothyroxine 88 MCG tablet Commonly known as: SYNTHROID Take 88 mcg by mouth every evening.   lisinopril 10 MG tablet Commonly known as: ZESTRIL Take 10 mg by mouth every morning.   metFORMIN 500 MG 24 hr tablet Commonly known as: GLUCOPHAGE-XR Take 1,000 mg by mouth in the morning and at bedtime.    methocarbamol 750 MG tablet Commonly known as: ROBAXIN TAKE ONE TABLET AT BEDTIME AS NEEDED FOR MUSCLE SPASMS   metoprolol tartrate 25 MG tablet Commonly known as: LOPRESSOR Take 25 mg by mouth 2 (two) times daily.   multivitamin-lutein Caps capsule Take 1 capsule by mouth daily.   pantoprazole 40 MG tablet Commonly known as: PROTONIX Take 40 mg by mouth daily.   pramipexole 0.125 MG tablet Commonly known as: MIRAPEX Take 1 tablet (0.125 mg total) by mouth daily. What changed: See the new instructions. Changed by: Elige Radon Maryum Batterson   rosuvastatin 5 MG tablet Commonly known as: CRESTOR Take 5 mg by mouth at bedtime.   Rybelsus 7 MG Tabs Generic drug: Semaglutide Take 1 tablet by mouth daily.  spironolactone 25 MG tablet Commonly known as: ALDACTONE Take 12.5 mg by mouth daily.   traMADol 50 MG tablet Commonly known as: ULTRAM Take 1 tablet (50 mg total) by mouth at bedtime as needed.   VITAMIN D PO Take 1 tablet by mouth daily.         Objective:   BP 110/65   Pulse 74   Ht 5\' 8"  (1.727 m)   Wt 192 lb (87.1 kg)   SpO2 98%   BMI 29.19 kg/m   Wt Readings from Last 3 Encounters:  08/09/23 192 lb (87.1 kg)  02/13/23 188 lb (85.3 kg)  12/01/22 186 lb (84.4 kg)    Physical Exam Vitals and nursing note reviewed.  Constitutional:      General: She is not in acute distress.    Appearance: She is well-developed. She is not diaphoretic.  Eyes:     Conjunctiva/sclera: Conjunctivae normal.     Pupils: Pupils are equal, round, and reactive to light.  Cardiovascular:     Rate and Rhythm: Normal rate and regular rhythm.     Heart sounds: Normal heart sounds. No murmur heard. Pulmonary:     Effort: Pulmonary effort is normal. No respiratory distress.     Breath sounds: Normal breath sounds. No wheezing.  Musculoskeletal:        General: No tenderness. Normal range of motion.  Skin:    General: Skin is warm and dry.     Findings: No rash.  Neurological:      Mental Status: She is alert and oriented to person, place, and time.     Coordination: Coordination normal.  Psychiatric:        Mood and Affect: Mood is anxious and depressed.        Behavior: Behavior normal.        Thought Content: Thought content does not include suicidal ideation. Thought content does not include suicidal plan.       Assessment & Plan:   Problem List Items Addressed This Visit       Cardiovascular and Mediastinum   Hypertension     Endocrine   Hypothyroidism   Diabetes mellitus, type 2 (HCC)   Relevant Orders   Microalbumin/Creatinine Ratio, Urine     Other   Mixed hyperlipidemia   GAD (generalized anxiety disorder)   Relevant Medications   hydrOXYzine (VISTARIL) 25 MG capsule   Spinal stenosis at L4-L5 level   Relevant Medications   gabapentin (NEURONTIN) 300 MG capsule   traMADol (ULTRAM) 50 MG tablet   Other Visit Diagnoses       Controlled substance agreement signed    -  Primary   Relevant Orders   ToxASSURE Select 13 (MW), Urine     RLS (restless legs syndrome)       Relevant Medications   pramipexole (MIRAPEX) 0.125 MG tablet       A1c and blood work done at her workplace, A1c was 6.6 and blood work looked really good.  No changes Follow up plan: Return in about 3 months (around 11/08/2023), or if symptoms worsen or fail to improve, for Diabetes anxiety.  Counseling provided for all of the vaccine components Orders Placed This Encounter  Procedures   ToxASSURE Select 13 (MW), Urine   Microalbumin/Creatinine Ratio, Urine    Arville Care, MD [place] [facility] 08/09/2023, 10:25 AM

## 2023-08-10 LAB — MICROALBUMIN / CREATININE URINE RATIO
Creatinine, Urine: 112.6 mg/dL
Microalb/Creat Ratio: 12 mg/g{creat} (ref 0–29)
Microalbumin, Urine: 13.5 ug/mL

## 2023-08-11 ENCOUNTER — Encounter: Payer: Self-pay | Admitting: Family Medicine

## 2023-08-11 LAB — TOXASSURE SELECT 13 (MW), URINE

## 2023-08-14 ENCOUNTER — Ambulatory Visit: Payer: BC Managed Care – PPO | Admitting: Family Medicine

## 2023-08-21 ENCOUNTER — Ambulatory Visit (INDEPENDENT_AMBULATORY_CARE_PROVIDER_SITE_OTHER)

## 2023-08-21 DIAGNOSIS — Z780 Asymptomatic menopausal state: Secondary | ICD-10-CM

## 2023-08-24 DIAGNOSIS — M85852 Other specified disorders of bone density and structure, left thigh: Secondary | ICD-10-CM | POA: Diagnosis not present

## 2023-08-24 DIAGNOSIS — Z780 Asymptomatic menopausal state: Secondary | ICD-10-CM | POA: Diagnosis not present

## 2023-08-24 DIAGNOSIS — M85851 Other specified disorders of bone density and structure, right thigh: Secondary | ICD-10-CM | POA: Diagnosis not present

## 2023-08-28 ENCOUNTER — Encounter: Payer: Self-pay | Admitting: Family Medicine

## 2023-09-12 DIAGNOSIS — M5416 Radiculopathy, lumbar region: Secondary | ICD-10-CM | POA: Diagnosis not present

## 2023-11-08 ENCOUNTER — Ambulatory Visit: Admitting: Family Medicine

## 2023-11-08 ENCOUNTER — Encounter: Payer: Self-pay | Admitting: Family Medicine

## 2023-11-08 VITALS — BP 107/74 | HR 79 | Temp 97.6°F | Ht 68.0 in | Wt 188.0 lb

## 2023-11-08 DIAGNOSIS — E782 Mixed hyperlipidemia: Secondary | ICD-10-CM | POA: Diagnosis not present

## 2023-11-08 DIAGNOSIS — I10 Essential (primary) hypertension: Secondary | ICD-10-CM

## 2023-11-08 DIAGNOSIS — E1169 Type 2 diabetes mellitus with other specified complication: Secondary | ICD-10-CM | POA: Diagnosis not present

## 2023-11-08 DIAGNOSIS — R7309 Other abnormal glucose: Secondary | ICD-10-CM | POA: Diagnosis not present

## 2023-11-08 DIAGNOSIS — E039 Hypothyroidism, unspecified: Secondary | ICD-10-CM | POA: Diagnosis not present

## 2023-11-08 DIAGNOSIS — F411 Generalized anxiety disorder: Secondary | ICD-10-CM

## 2023-11-08 LAB — BAYER DCA HB A1C WAIVED: HB A1C (BAYER DCA - WAIVED): 6.5 % — ABNORMAL HIGH (ref 4.8–5.6)

## 2023-11-08 NOTE — Progress Notes (Signed)
 Established Patient Office Visit  Subjective   Patient ID: [name], female    DOB: 11-22-1950  Age: 73 y.o. MRN: [id number]  HPI (1) Diabetes Mellitus The patient is present for a routine follow-up for type 2 diabetes mellitus. Patient report overall stable blood glucose control with home glucose readings typically ranging from 120s and average fasting glucose of 100s. Patient deny any recent symptoms of polyuria, polydipsia, polyphagia, fatigue, or unexplained weight loss. No episodes of hypoglycemia or hyperglycemia requiring emergency care have occurred recently. Last hemoglobin A1C on 11/08/23 was 6.5. The patient reports good adherence to prescribed medications, which include Metformin and Rybelsus. Patient is aware about monitoring their carbohydrate intake and report consistent adherence to following a diabetic-friendly die, however she mention that it has been hard for her to cook for herself after her husband passed-away recently. No new diabetic complications such as foot ulcers, neuropathy, or vision changes reported today. No new questions or concerns.   (2) Anxiety  Patient is coming in today for anxiety recheck. She currently uses hydroxyzine  and Mirapex  to help with sleep as well. She is dealing with a lot right now, with her husband passing away a couple weeks ago. She also mentions that she has had several events in her life recently that have mentally and emotionally draining. She denies any suicide ideations or thoughts of hurting self. She is working on talking with some people including family and her pastor to help her through this process. She feels like she is doing okay right now and does not report any red-flag symptoms.    Review of Systems  Constitutional:  Negative for chills, fever, malaise/fatigue and weight loss.  HENT:  Negative for congestion, hearing loss and sore throat.   Eyes:  Negative for blurred vision.  Respiratory:  Negative for cough and shortness  of breath.   Cardiovascular:  Negative for chest pain and palpitations.  Gastrointestinal:  Negative for abdominal pain, constipation, diarrhea, nausea and vomiting.  Genitourinary:  Negative for dysuria, frequency and urgency.  Musculoskeletal:  Positive for back pain. Negative for joint pain and myalgias.  Skin:  Negative for itching and rash.  Neurological:  Negative for dizziness, sensory change and headaches.  Psychiatric/Behavioral:  Negative for depression and suicidal ideas. The patient is nervous/anxious (Mentions that she feels overhwhelmed due to various events in life that have been mentally and emotonally draining.).       Objective:     BP 107/74   Pulse 79   Temp 97.6 F (36.4 C)   Ht 5' 8 (1.727 m)   Wt 188 lb (85.3 kg)   SpO2 97%   BMI 28.59 kg/m    Physical Exam Constitutional:      General: She is not in acute distress.    Appearance: Normal appearance.  Cardiovascular:     Rate and Rhythm: Normal rate and regular rhythm.     Pulses: Normal pulses.     Heart sounds: Normal heart sounds. No murmur heard. Pulmonary:     Effort: Pulmonary effort is normal. No respiratory distress.     Breath sounds: Normal breath sounds.  Musculoskeletal:     Cervical back: Normal range of motion and neck supple.  Neurological:     Mental Status: She is alert and oriented to person, place, and time.  Psychiatric:        Mood and Affect: Mood normal.        Behavior: Behavior normal.  No results found for any visits on 11/08/23.   (1) A1C - 6.5 (11/08/23)  The ASCVD Risk score (Arnett DK, et al., 2019) failed to calculate for the following reasons:   The valid total cholesterol range is 130 to 320 mg/dL    Assessment & Plan:   (1) Diabetes - Assessment: Exceptional control with medications. A1C today is 6.5. - Plan: Continue taking metformin and rybelsus as prescribed. Encouraged to monitor diet and increase protein intake.   (2) Anxiety - Assessment:   - Plan: Encouraged to keep engaging with family and pastor to help her with the transition. Continue taking hydroxyzine  and Mirapex  as prescribed. Advised her to RTC if symptoms persist or worsen.   [name], Medical Student  [employer]  at [facility] 11/08/23 3:49 PM   I was personally present for all components of the history, physical exam and/or medical decision making.  I agree with the documentation performed by the student and agree with assessment and plan above.  Amanda Levins, MD [facility] 11/15/2023, 1:51 PM

## 2023-11-09 LAB — CBC WITH DIFFERENTIAL/PLATELET
Basophils Absolute: 0.1 x10E3/uL (ref 0.0–0.2)
Basos: 1 %
EOS (ABSOLUTE): 0.2 x10E3/uL (ref 0.0–0.4)
Eos: 3 %
Hematocrit: 41.5 % (ref 34.0–46.6)
Hemoglobin: 12.9 g/dL (ref 11.1–15.9)
Immature Grans (Abs): 0 x10E3/uL (ref 0.0–0.1)
Immature Granulocytes: 0 %
Lymphocytes Absolute: 1.8 x10E3/uL (ref 0.7–3.1)
Lymphs: 20 %
MCH: 27 pg (ref 26.6–33.0)
MCHC: 31.1 g/dL — ABNORMAL LOW (ref 31.5–35.7)
MCV: 87 fL (ref 79–97)
Monocytes Absolute: 0.5 x10E3/uL (ref 0.1–0.9)
Monocytes: 6 %
Neutrophils Absolute: 6.3 x10E3/uL (ref 1.4–7.0)
Neutrophils: 70 %
Platelets: 292 x10E3/uL (ref 150–450)
RBC: 4.78 x10E6/uL (ref 3.77–5.28)
RDW: 14.4 % (ref 11.7–15.4)
WBC: 8.8 x10E3/uL (ref 3.4–10.8)

## 2023-11-09 LAB — CMP14+EGFR
ALT: 23 IU/L (ref 0–32)
AST: 27 IU/L (ref 0–40)
Albumin: 4.4 g/dL (ref 3.8–4.8)
Alkaline Phosphatase: 75 IU/L (ref 44–121)
BUN/Creatinine Ratio: 14 (ref 12–28)
BUN: 10 mg/dL (ref 8–27)
Bilirubin Total: 0.6 mg/dL (ref 0.0–1.2)
CO2: 22 mmol/L (ref 20–29)
Calcium: 9.9 mg/dL (ref 8.7–10.3)
Chloride: 101 mmol/L (ref 96–106)
Creatinine, Ser: 0.72 mg/dL (ref 0.57–1.00)
Globulin, Total: 1.8 g/dL (ref 1.5–4.5)
Glucose: 132 mg/dL — ABNORMAL HIGH (ref 70–99)
Potassium: 4.2 mmol/L (ref 3.5–5.2)
Sodium: 140 mmol/L (ref 134–144)
Total Protein: 6.2 g/dL (ref 6.0–8.5)
eGFR: 89 mL/min/1.73 (ref >59)

## 2023-11-09 LAB — LIPID PANEL
Chol/HDL Ratio: 3.1 ratio (ref 0.0–4.4)
Cholesterol, Total: 126 mg/dL (ref 100–199)
HDL: 41 mg/dL (ref >39)
LDL Chol Calc (NIH): 56 mg/dL (ref 0–99)
Triglycerides: 171 mg/dL — ABNORMAL HIGH (ref 0–149)
VLDL Cholesterol Cal: 29 mg/dL (ref 5–40)

## 2023-11-09 LAB — TSH: TSH: 1.12 u[IU]/mL (ref 0.450–4.500)

## 2023-11-13 ENCOUNTER — Ambulatory Visit: Payer: Self-pay | Admitting: Family Medicine

## 2023-12-06 DIAGNOSIS — L821 Other seborrheic keratosis: Secondary | ICD-10-CM | POA: Diagnosis not present

## 2023-12-25 DIAGNOSIS — M5416 Radiculopathy, lumbar region: Secondary | ICD-10-CM | POA: Diagnosis not present

## 2024-01-02 DIAGNOSIS — R92313 Mammographic fatty tissue density, bilateral breasts: Secondary | ICD-10-CM | POA: Diagnosis not present

## 2024-01-02 DIAGNOSIS — Z1231 Encounter for screening mammogram for malignant neoplasm of breast: Secondary | ICD-10-CM | POA: Diagnosis not present

## 2024-01-02 LAB — HM MAMMOGRAPHY

## 2024-01-04 ENCOUNTER — Encounter: Payer: Self-pay | Admitting: Family Medicine

## 2024-01-31 DIAGNOSIS — E119 Type 2 diabetes mellitus without complications: Secondary | ICD-10-CM | POA: Diagnosis not present

## 2024-02-05 ENCOUNTER — Telehealth: Payer: Self-pay | Admitting: Family Medicine

## 2024-02-05 NOTE — Telephone Encounter (Signed)
 Dr. Lesleigh done the last injection the first of the year. Pt states that she can hardly walk and would like to save a trip to [place]. Informed pt that Dr. Maryanne can evaluate her during her regular appt. It will be up to him to do the injections but informed that I believed he would.

## 2024-02-05 NOTE — Telephone Encounter (Signed)
 Copied from CRM [id number]. Topic: General - Other >> Feb 05, 2024  1:09 PM Zebedee SAUNDERS wrote: Reason for CRM: Pt wants to know if at her appt on Thurs Oct. 10th if Dr. Maryanne can give her a steroid shot on her right knee? Please call pt at [phone].

## 2024-02-08 ENCOUNTER — Encounter: Payer: Self-pay | Admitting: Family Medicine

## 2024-02-08 ENCOUNTER — Ambulatory Visit: Admitting: Family Medicine

## 2024-02-08 VITALS — BP 127/87 | HR 87 | Ht 68.0 in | Wt 192.0 lb

## 2024-02-08 DIAGNOSIS — M1711 Unilateral primary osteoarthritis, right knee: Secondary | ICD-10-CM

## 2024-02-08 DIAGNOSIS — E039 Hypothyroidism, unspecified: Secondary | ICD-10-CM | POA: Diagnosis not present

## 2024-02-08 DIAGNOSIS — E1169 Type 2 diabetes mellitus with other specified complication: Secondary | ICD-10-CM | POA: Diagnosis not present

## 2024-02-08 DIAGNOSIS — R7309 Other abnormal glucose: Secondary | ICD-10-CM | POA: Diagnosis not present

## 2024-02-08 DIAGNOSIS — Z7984 Long term (current) use of oral hypoglycemic drugs: Secondary | ICD-10-CM

## 2024-02-08 DIAGNOSIS — I10 Essential (primary) hypertension: Secondary | ICD-10-CM

## 2024-02-08 DIAGNOSIS — M48061 Spinal stenosis, lumbar region without neurogenic claudication: Secondary | ICD-10-CM

## 2024-02-08 DIAGNOSIS — E782 Mixed hyperlipidemia: Secondary | ICD-10-CM

## 2024-02-08 LAB — BAYER DCA HB A1C WAIVED: HB A1C (BAYER DCA - WAIVED): 6.6 % — ABNORMAL HIGH (ref 4.8–5.6)

## 2024-02-08 MED ORDER — TRAMADOL HCL 50 MG PO TABS: 50.0000 mg | ORAL_TABLET | Freq: Every evening | ORAL | 5 refills | Status: AC | PRN

## 2024-02-08 MED ORDER — METHYLPREDNISOLONE ACETATE 80 MG/ML IJ SUSP
80.0000 mg | Freq: Once | INTRAMUSCULAR | Status: AC
  Administered 2024-02-08: 80 mg via INTRA_ARTICULAR

## 2024-02-08 NOTE — Progress Notes (Signed)
 BP 127/87   Pulse 87   Ht 5' 8 (1.727 m)   Wt 192 lb (87.1 kg)   SpO2 97%   BMI 29.19 kg/m    Subjective:   Patient ID: [name], female    DOB: December 21, 1950, 73 y.o.   MRN: [id number]  HPI: [name] is a 73 y.o. female presenting on 02/08/2024 for Medical Management of Chronic Issues, Diabetes, Hypertension, and Knee Pain   Discussed the use of AI scribe software for clinical note transcription with the patient, who gave verbal consent to proceed.  History of Present Illness   [name] is a 73 year old female with diabetes, hypertension, and chronic pain who presents for a recheck of her chronic medical issues and pain management.  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 - History of knee injections, last received 3-4 months ago in the other knee - Prefers to receive knee injection today to avoid travel  Glycemic control - Diabetes managed with metformin and Rybelsus - Recent blood sugar reported as 6.6 - No issues with current diabetes medications  Hypertension and hyperlipidemia management - Takes lisinopril  for blood pressure and Crestor for cholesterol - No changes in medication regimen - No reported issues with these medications  Sleepwalking and neurological symptoms - Recent sleepwalking episode involving attempting to leave the house with sheets, believing there was a snake - No recent  changes in medications or life circumstances except for a stressful event prior to the episode - Experiences auras without subsequent headaches, which is atypical as auras usually precede migraines - Auras occur when upset or overheated - Concerned about these symptoms due to family history of brain tumors in father and two aunts, and original husband died from a brain tumor          Relevant past medical, surgical, family and social history reviewed and updated as indicated. Interim medical history since our last visit reviewed. Allergies and medications reviewed and updated.  Review of Systems  Constitutional:  Negative for chills and fever.  Eyes:  Negative for redness and visual disturbance.  Respiratory:  Negative for chest tightness and shortness of breath.   Cardiovascular:  Negative for chest pain and leg swelling.  Genitourinary:  Negative for difficulty urinating and dysuria.  Musculoskeletal:  Positive for arthralgias, back pain and myalgias. Negative for gait problem.  Skin:  Negative for rash.  Neurological:  Negative for light-headedness and headaches.  Psychiatric/Behavioral:  Negative for agitation and behavioral problems.   All other systems reviewed and are negative.   Per HPI unless specifically indicated above   Allergies as of 02/08/2024       Reactions   Hydromorphone Nausea And Vomiting        Medication List        Accurate as of February 08, 2024  3:03 PM. If you have any questions, ask your nurse or doctor.  Biotin 5000 MCG Tabs Take 10,000 mcg daily by mouth.   CALCIUM 1200 PO Take by mouth.   co-enzyme Q-10 30 MG capsule Take 30 mg by mouth daily.   conjugated estrogens  0.625 MG/GM vaginal cream Commonly known as: Premarin  Use 0.5 gm in vagina at hs for 2 weeks then 2-3 x weekly   fluticasone  50 MCG/ACT nasal spray Commonly known as: FLONASE  Place 1 spray into both nostrils 2 (two) times daily as needed for allergies or  rhinitis.   gabapentin  300 MG capsule Commonly known as: NEURONTIN  Take 1 capsule (300 mg total) by mouth 3 (three) times daily.   hydrOXYzine  25 MG capsule Commonly known as: VISTARIL  Take 1-2 capsules (25-50 mg total) by mouth at bedtime as needed.   levothyroxine  88 MCG tablet Commonly known as: SYNTHROID  Take 88 mcg by mouth every evening.   lisinopril  10 MG tablet Commonly known as: ZESTRIL  Take 10 mg by mouth every morning.   metFORMIN 500 MG 24 hr tablet Commonly known as: GLUCOPHAGE-XR Take 1,000 mg by mouth in the morning and at bedtime.   methocarbamol  750 MG tablet Commonly known as: ROBAXIN  TAKE ONE TABLET AT BEDTIME AS NEEDED FOR MUSCLE SPASMS   metoprolol  tartrate 25 MG tablet Commonly known as: LOPRESSOR  Take 25 mg by mouth 2 (two) times daily.   multivitamin-lutein Caps capsule Take 1 capsule by mouth daily.   pantoprazole  40 MG tablet Commonly known as: PROTONIX  Take 40 mg by mouth daily.   pramipexole  0.125 MG tablet Commonly known as: MIRAPEX  Take 1 tablet (0.125 mg total) by mouth daily.   rosuvastatin 5 MG tablet Commonly known as: CRESTOR Take 5 mg by mouth at bedtime.   Rybelsus 7 MG Tabs Generic drug: Semaglutide Take 1 tablet by mouth daily.   spironolactone  25 MG tablet Commonly known as: ALDACTONE  Take 12.5 mg by mouth daily.   traMADol  50 MG tablet Commonly known as: ULTRAM  Take 1 tablet (50 mg total) by mouth at bedtime as needed.   VITAMIN D PO Take 1 tablet by mouth daily.         Objective:   BP 127/87   Pulse 87   Ht 5' 8 (1.727 m)   Wt 192 lb (87.1 kg)   SpO2 97%   BMI 29.19 kg/m   Wt Readings from Last 3 Encounters:  02/08/24 192 lb (87.1 kg)  11/08/23 188 lb (85.3 kg)  08/09/23 192 lb (87.1 kg)    Physical Exam Vitals and nursing note reviewed.  Constitutional:      General: She is not in acute distress.    Appearance: She is well-developed. She is not diaphoretic.  Eyes:     Conjunctiva/sclera:  Conjunctivae normal.  Cardiovascular:     Rate and Rhythm: Normal rate and regular rhythm.     Heart sounds: Normal heart sounds. No murmur heard. Pulmonary:     Effort: Pulmonary effort is normal. No respiratory distress.     Breath sounds: Normal breath sounds. No wheezing.  Skin:    General: Skin is warm and dry.     Findings: No rash.  Neurological:     Mental Status: She is alert and oriented to person, place, and time.     Coordination: Coordination normal.  Psychiatric:        Behavior: Behavior normal.    Physical Exam   CHEST: Lungs clear to auscultation.       Knee injection: Consent form signed. Risk factors of bleeding and infection discussed with  patient and patient is agreeable towards injection. Patient prepped with Betadine. Lateral approach towards injection used. Injected 80 mg of Depo-Medrol  and 1 mL of 2% lidocaine . Patient tolerated procedure well and no side effects from noted. Minimal to no bleeding. Simple bandage applied after.   Assessment & Plan:   Problem List Items Addressed This Visit       Cardiovascular and Mediastinum   Hypertension     Endocrine   Hypothyroidism   Diabetes mellitus, type 2 (HCC) - Primary   Relevant Orders   Bayer DCA Hb A1c Waived     Other   Mixed hyperlipidemia   Spinal stenosis at L4-L5 level   Relevant Medications   traMADol  (ULTRAM ) 50 MG tablet   Other Visit Diagnoses       Primary osteoarthritis of right knee       Relevant Medications   traMADol  (ULTRAM ) 50 MG tablet   methylPREDNISolone  acetate (DEPO-MEDROL ) injection 80 mg           Knee osteoarthritis Chronic knee osteoarthritis with pain and stiffness, particularly in the right knee. Prefers local treatment to avoid travel. - Administer corticosteroid injection in the right knee today.  Lumbar spinal stenosis Chronic lumbar spinal stenosis managed with tramadol . - Continue tramadol  50 mg QHS PRN for pain management. - Review [place]   Drug Database for refill accuracy.  Type 2 diabetes mellitus with diabetic neuropathy Type 2 diabetes mellitus with well-controlled blood sugar levels. Neuropathic pain managed with gabapentin . - Continue metformin and Rybelsus for diabetes management. - Continue gabapentin  for neuropathic pain.  Hypertension Hypertension is well-controlled with current medication regimen. - Continue lisinopril  for blood pressure management.  Hyperlipidemia Hyperlipidemia is managed with Crestor. - Continue Crestor for cholesterol management.  Sleepwalking (somnambulism) and migraine aura without headache Recent onset of sleepwalking episodes and migraine auras without headache. Possible stress-related trigger. Neurological evaluation recommended. - Refer to neurologist for evaluation of sleepwalking and migraine auras. - Consider sleep study to assess for brainwave abnormalities.          Follow up plan: Return in about 3 months (around 05/10/2024), or if symptoms worsen or fail to improve, for Diabetes recheck.  Counseling provided for all of the vaccine components Orders Placed This Encounter  Procedures   Bayer DCA Hb A1c Waived    Fonda Levins, MD [facility] 02/08/2024, 3:03 PM

## 2024-03-02 ENCOUNTER — Other Ambulatory Visit: Payer: Self-pay | Admitting: *Deleted

## 2024-03-02 DIAGNOSIS — M48061 Spinal stenosis, lumbar region without neurogenic claudication: Secondary | ICD-10-CM

## 2024-03-22 DIAGNOSIS — M1712 Unilateral primary osteoarthritis, left knee: Secondary | ICD-10-CM | POA: Diagnosis not present

## 2024-04-15 ENCOUNTER — Ambulatory Visit: Admitting: Nurse Practitioner

## 2024-04-15 ENCOUNTER — Ambulatory Visit: Payer: Self-pay

## 2024-04-15 VITALS — BP 124/77 | HR 81 | Temp 97.3°F | Ht 68.0 in | Wt 190.8 lb

## 2024-04-15 DIAGNOSIS — H66011 Acute suppurative otitis media with spontaneous rupture of ear drum, right ear: Secondary | ICD-10-CM | POA: Diagnosis not present

## 2024-04-15 DIAGNOSIS — R051 Acute cough: Secondary | ICD-10-CM | POA: Diagnosis not present

## 2024-04-15 MED ORDER — AZELASTINE HCL 0.1 % NA SOLN: 1.0000 | Freq: Two times a day (BID) | NASAL | 5 refills | Status: AC

## 2024-04-15 MED ORDER — CEFDINIR 300 MG PO CAPS: 300.0000 mg | ORAL_CAPSULE | Freq: Two times a day (BID) | ORAL | 0 refills | Status: AC

## 2024-04-15 MED ORDER — GUAIFENESIN 400 MG PO TABS: 400.0000 mg | ORAL_TABLET | Freq: Four times a day (QID) | ORAL | 0 refills | Status: AC | PRN

## 2024-04-15 NOTE — Progress Notes (Signed)
 Subjective:  Patient ID: [name], female    DOB: 1950-05-11, 73 y.o.   MRN: [id number]  Patient Care Team: Dettinger, Fonda LABOR, MD as PCP - General (Family Medicine) Oman, Heather, OD [facility])   Chief Complaint:  Nasal Congestion (Symptoms for a month ) and Cough   HPI: [name] is a 73 y.o. female presenting on 04/15/2024 for Nasal Congestion (Symptoms for a month ) and Cough   Discussed the use of AI scribe software for clinical note transcription with the patient, who gave verbal consent to proceed.  History of Present Illness [name] is a 73 year old female who presents with persistent cough and sinus congestion.  She has a persistent cough that has not improved despite using a whole box of Mucinex  and a bottle of Deltan. The cough produces thick green sputum, described as 'you could use it for glue'. No fever, sore throat, shortness of breath, or headache. She has not taken any antibiotics for this condition.  She experiences significant nasal congestion and sinus pain, particularly at night when lying down, causing alternating nasal obstruction depending on her position. Denies shortness of breath, chest pain, syncope    Relevant past medical, surgical, family, and social history reviewed and updated as indicated.  Allergies and medications reviewed and updated. Data reviewed: Chart in Epic.   Past Medical History:  Diagnosis Date   Anxiety    Diabetes mellitus    type II    Dysrhythmia    tachycardia    GERD (gastroesophageal reflux disease)    Hyperlipidemia    Hypertension    Hypothyroidism    Neuromuscular disorder (HCC)    muscle cramps     Past Surgical History:  Procedure Laterality Date   APPENDECTOMY     CARPAL TUNNEL RELEASE     CHOLECYSTECTOMY     KNEE ARTHROSCOPY     left   LUMBAR LAMINECTOMY/DECOMPRESSION MICRODISCECTOMY N/A 03/15/2017   Procedure: Microlumbar decompression L4-5, L5-S1;  Surgeon: Duwayne Purchase, MD;   Location: [facility];  Service: Orthopedics;  Laterality: N/A;  150 mins   PARTIAL HYSTERECTOMY      Social History   Socioeconomic History   Marital status: Legally Separated    Spouse name: Not on file   Number of children: 2   Years of education: Not on file   Highest education level: Bachelor's degree (e.g., BA, AB, BS)  Occupational History   Occupation: Teacher, Adult Education: [employer]  Tobacco Use   Smoking status: Never   Smokeless tobacco: Never  Vaping Use   Vaping status: Never Used  Substance and Sexual Activity   Alcohol use: No   Drug use: No   Sexual activity: Yes    Birth control/protection: Surgical    Comment: hyst  Other Topics Concern   Not on file  Social History Narrative   Not on file   Social Drivers of Health   Tobacco Use: Low Risk (04/15/2024)   Patient History    Smoking Tobacco Use: Never    Smokeless Tobacco Use: Never    Passive Exposure: Not on file  Financial Resource Strain: Low Risk (04/15/2024)   Overall Financial Resource Strain (CARDIA)    Difficulty of Paying Living Expenses: Not very hard  Food Insecurity: No Food Insecurity (04/15/2024)   Epic    Worried About Radiation Protection Practitioner of Food in the Last Year: Never true    Ran Out of Food in the Last  Year: Never true  Transportation Needs: No Transportation Needs (04/15/2024)   Epic    Lack of Transportation (Medical): No    Lack of Transportation (Non-Medical): No  Physical Activity: Sufficiently Active (04/15/2024)   Exercise Vital Sign    Days of Exercise per Week: 4 days    Minutes of Exercise per Session: 60 min  Stress: No Stress Concern Present (04/15/2024)   [employer] of Occupational Health - Occupational Stress Questionnaire    Feeling of Stress: Not at all  Social Connections: Moderately Integrated (04/15/2024)   Social Connection and Isolation Panel    Frequency of Communication with Friends and Family: More than three times a week    Frequency of Social Gatherings with  Friends and Family: More than three times a week    Attends Religious Services: More than 4 times per year    Active Member of [employer] or Organizations: Yes    Attends Banker Meetings: 1 to 4 times per year    Marital Status: Widowed  Intimate Partner Violence: Not on file  Depression (PHQ2-9): Low Risk (02/08/2024)   Depression (PHQ2-9)    PHQ-2 Score: 2  Alcohol Screen: Not on file  Housing: Unknown (04/15/2024)   Epic    Unable to Pay for Housing in the Last Year: No    Number of Times Moved in the Last Year: Not on file    Homeless in the Last Year: No  Utilities: Not on file  Health Literacy: Not on file    Outpatient Encounter Medications as of 04/15/2024  Medication Sig   azelastine  (ASTELIN ) 0.1 % nasal spray Place 1 spray into both nostrils 2 (two) times daily. Use in each nostril as directed   Biotin 5000 MCG TABS Take 10,000 mcg daily by mouth.    Calcium Carbonate-Vit D-Min (CALCIUM 1200 PO) Take by mouth.   cefdinir  (OMNICEF ) 300 MG capsule Take 1 capsule (300 mg total) by mouth 2 (two) times daily. 1 po BID   Cholecalciferol (VITAMIN D PO) Take 1 tablet by mouth daily.   co-enzyme Q-10 30 MG capsule Take 30 mg by mouth daily.   conjugated estrogens  (PREMARIN ) vaginal cream Use 0.5 gm in vagina at hs for 2 weeks then 2-3 x weekly   fluticasone  (FLONASE ) 50 MCG/ACT nasal spray Place 1 spray into both nostrils 2 (two) times daily as needed for allergies or rhinitis.   gabapentin  (NEURONTIN ) 300 MG capsule Take 1 capsule (300 mg total) by mouth 3 (three) times daily.   guaifenesin  (HUMIBID E) 400 MG TABS tablet Take 1 tablet (400 mg total) by mouth every 6 (six) hours as needed.   hydrOXYzine  (VISTARIL ) 25 MG capsule Take 1-2 capsules (25-50 mg total) by mouth at bedtime as needed.   levothyroxine  (SYNTHROID , LEVOTHROID) 88 MCG tablet Take 88 mcg by mouth every evening.    lisinopril  (PRINIVIL ,ZESTRIL ) 10 MG tablet Take 10 mg by mouth every morning.    metFORMIN  (GLUCOPHAGE-XR) 500 MG 24 hr tablet Take 1,000 mg by mouth in the morning and at bedtime.   methocarbamol  (ROBAXIN ) 750 MG tablet TAKE ONE TABLET AT BEDTIME AS NEEDED FOR MUSCLE SPASMS   metoprolol  tartrate (LOPRESSOR ) 25 MG tablet Take 25 mg by mouth 2 (two) times daily.    multivitamin-lutein (OCUVITE-LUTEIN) CAPS capsule Take 1 capsule by mouth daily.   pantoprazole  (PROTONIX ) 40 MG tablet Take 40 mg by mouth daily.   pramipexole  (MIRAPEX ) 0.125 MG tablet Take 1 tablet (0.125 mg total) by mouth daily.  rosuvastatin (CRESTOR) 5 MG tablet Take 5 mg by mouth at bedtime.    RYBELSUS 7 MG TABS Take 1 tablet by mouth daily.   spironolactone  (ALDACTONE ) 25 MG tablet Take 12.5 mg by mouth daily.   traMADol  (ULTRAM ) 50 MG tablet Take 1 tablet (50 mg total) by mouth at bedtime as needed.   No facility-administered encounter medications on file as of 04/15/2024.    Allergies[1]  Review of Systems  Constitutional:  Negative for chills and fever.  HENT:  Positive for congestion. Negative for sinus pain and sore throat.   Respiratory:  Positive for cough and sputum production. Negative for shortness of breath.        Tick green mucus  Cardiovascular:  Negative for chest pain and leg swelling.  Gastrointestinal:  Negative for constipation, diarrhea, nausea and vomiting.  Skin:  Negative for itching and rash.  Neurological:  Negative for dizziness and headaches.         Objective:  BP 124/77   Pulse 81   Temp (!) 97.3 F (36.3 C) (Temporal)   Ht 5' 8 (1.727 m)   Wt 190 lb 12.8 oz (86.5 kg)   SpO2 99%   BMI 29.01 kg/m    Wt Readings from Last 3 Encounters:  04/15/24 190 lb 12.8 oz (86.5 kg)  02/08/24 192 lb (87.1 kg)  11/08/23 188 lb (85.3 kg)    Physical Exam Vitals and nursing note reviewed.  Constitutional:      General: She is not in acute distress. HENT:     Head: Normocephalic and atraumatic.     Right Ear: Tympanic membrane is erythematous and bulging.     Left Ear:  Hearing, tympanic membrane, ear canal and external ear normal.     Nose: Nose normal.     Mouth/Throat:     Mouth: Mucous membranes are moist.  Eyes:     General: No scleral icterus.    Extraocular Movements: Extraocular movements intact.     Conjunctiva/sclera: Conjunctivae normal.     Pupils: Pupils are equal, round, and reactive to light.  Cardiovascular:     Heart sounds: Normal heart sounds.  Pulmonary:     Effort: Pulmonary effort is normal.     Breath sounds: Normal breath sounds.  Abdominal:     General: Bowel sounds are normal.     Palpations: Abdomen is soft.  Musculoskeletal:        General: Normal range of motion.     Right lower leg: No edema.     Left lower leg: No edema.  Skin:    General: Skin is warm and dry.     Findings: No rash.  Neurological:     General: No focal deficit present.     Mental Status: She is alert and oriented to person, place, and time.  Psychiatric:        Mood and Affect: Mood normal.        Behavior: Behavior normal.        Thought Content: Thought content normal.        Judgment: Judgment normal.    Physical Exam HEENT: Left ear infection     Results for orders placed or performed in visit on 02/08/24  Bayer DCA Hb A1c Waived   Collection Time: 02/08/24  2:20 PM  Result Value Ref Range   HB A1C (BAYER DCA - WAIVED) 6.6 (H) 4.8 - 5.6 %       Pertinent labs & imaging results that were available  during my care of the patient were reviewed by me and considered in my medical decision making.  Assessment & Plan:  [name] was seen today for nasal congestion and cough.  Diagnoses and all orders for this visit:  Non-recurrent acute suppurative otitis media of right ear with spontaneous rupture of tympanic membrane -     azelastine  (ASTELIN ) 0.1 % nasal spray; Place 1 spray into both nostrils 2 (two) times daily. Use in each nostril as directed -     cefdinir  (OMNICEF ) 300 MG capsule; Take 1 capsule (300 mg total) by mouth 2 (two)  times daily. 1 po BID -     guaifenesin  (HUMIBID E) 400 MG TABS tablet; Take 1 tablet (400 mg total) by mouth every 6 (six) hours as needed.  Acute cough -     azelastine  (ASTELIN ) 0.1 % nasal spray; Place 1 spray into both nostrils 2 (two) times daily. Use in each nostril as directed -     cefdinir  (OMNICEF ) 300 MG capsule; Take 1 capsule (300 mg total) by mouth 2 (two) times daily. 1 po BID -     guaifenesin  (HUMIBID E) 400 MG TABS tablet; Take 1 tablet (400 mg total) by mouth every 6 (six) hours as needed.     Assessment and Plan [name] is a 73 year old Caucasian female seen today for URI/otitis media, no acute distress Assessment & Plan Acute suppurative otitis media, right ear. No fever, shortness of breath, or headache. - Prescribed cefdinir  300 mg twice daily for 7 days.  Acute cough due to upper respiratory infection Acute cough with upper respiratory infection, thick green sputum, nasal congestion, and sinus pain. Postnasal drip likely contributing. - Prescribed Astelin  nasal spray twice daily. - Prescribed guaifenesin  400 mg up to three times daily as needed with water. - Increase hydration, Tylenol /ibuprofen  if develop a fever     Continue all other maintenance medications.  Follow up plan: Return if symptoms worsen or fail to improve.   Continue healthy lifestyle choices, including diet (rich in fruits, vegetables, and lean proteins, and low in salt and simple carbohydrates) and exercise (at least 30 minutes of moderate physical activity daily).  Educational handout given for   Clinical References  Otitis Media, Adult  Otitis media is a condition in which the middle ear is red and swollen (inflamed) and full of fluid. The middle ear is the part of the ear that contains bones for hearing as well as air that helps send sounds to the brain. The condition usually goes away on its own. What are the causes? This condition is caused by a blockage in the eustachian tube.  This tube connects the middle ear to the back of the nose. It normally allows air into the middle ear. The blockage is caused by fluid or swelling. Problems that can cause blockage include: A cold or infection that affects the nose, mouth, or throat. Allergies. An irritant, such as tobacco smoke. Adenoids that have become large. The adenoids are soft tissue located in the back of the throat, behind the nose and the roof of the mouth. Growth or swelling in the upper part of the throat, just behind the nose (nasopharynx). Damage to the ear caused by a change in pressure. This is called barotrauma. What increases the risk? You are more likely to develop this condition if you: Smoke or are exposed to tobacco smoke. Have an opening in the roof of your mouth (cleft palate). Have acid reflux. Have problems in your body's defense  system (immune system). What are the signs or symptoms? Symptoms of this condition include: Ear pain. Fever. Problems with hearing. Being tired. Fluid leaking from the ear. Ringing in the ear. How is this treated? This condition can go away on its own within 3-5 days. But if the condition is caused by germs (bacteria) and does not go away on its own, or if it keeps coming back, your doctor may: Give you antibiotic medicines. Give you medicines for pain. Follow these instructions at home: Take over-the-counter and prescription medicines only as told by your doctor. If you were prescribed an antibiotic medicine, take it as told by your doctor. Do not stop taking it even if you start to feel better. Keep all follow-up visits. Contact a doctor if: You have bleeding from your nose. There is a lump on your neck. You are not feeling better in 5 days. You feel worse instead of better. Get help right away if: You have pain that is not helped with medicine. You have swelling, redness, or pain around your ear. You get a stiff neck. You cannot move part of your face  (paralysis). You notice that the bone behind your ear hurts when you touch it. You get a very bad headache. Summary Otitis media means that the middle ear is red, swollen, and full of fluid. This condition usually goes away on its own. If the problem does not go away, treatment may be needed. You may be given medicines to treat the infection or to treat your pain. If you were prescribed an antibiotic medicine, take it as told by your doctor. Do not stop taking it even if you start to feel better. Keep all follow-up visits. This information is not intended to replace advice given to you by your health care provider. Make sure you discuss any questions you have with your health care provider. Document Revised: 07/27/2020 Document Reviewed: 07/27/2020 Elsevier Patient Education  2024 Else[employer] Inc. Cough, Adult A cough helps to clear your throat and lungs. It may be a sign of an illness or another condition. A short-term (acute) cough may last 2-3 weeks. A long-term (chronic) cough may last 8 or more weeks. Many things can cause a cough. They include: Illnesses such as: An infection in your throat or lungs. Asthma or other heart or lung problems. Gastroesophageal reflux. This is when acid comes back up from your stomach. Breathing in things that bother (irritate) your lungs. Allergies. Postnasal drip. This is when mucus runs down the back of your throat. Smoking. Some medicines. Follow these instructions at home: Medicines Take over-the-counter and prescription medicines only as told by your doctor. Talk with your doctor before you take cough medicine (cough suppressants). Eating and drinking Do not drink alcohol. Do not drink caffeine. Drink enough fluid to keep your pee (urine) pale yellow. Lifestyle Stay away from cigarette smoke. Do not smoke or use any products that contain nicotine or tobacco. If you need help quitting, ask your doctor. Stay away from things that make you  cough. These may include perfume, candles, cleaning products, or campfire smoke. General instructions  Watch for any changes to your cough. Tell your doctor about them. Always cover your mouth when you cough. If the air is dry in your home, use a cool mist vaporizer or humidifier. If your cough is worse at night, try using extra pillows to raise your head up higher while you sleep. Rest as needed. Contact a doctor if: You have new symptoms. Your symptoms  get worse. You cough up pus. You have a fever that does not go away. Your cough does not get better after 2-3 weeks. Cough medicine does not help, and you are not sleeping well. You have pain that gets worse or is not helped with medicine. You are losing weight and do not know why. You have night sweats. Get help right away if: You cough up blood. You have trouble breathing. Your heart is beating very fast. These symptoms may be an emergency. Get help right away. Call 911. Do not wait to see if the symptoms will go away. Do not drive yourself to the hospital. This information is not intended to replace advice given to you by your health care provider. Make sure you discuss any questions you have with your health care provider. Document Revised: 12/17/2021 Document Reviewed: 12/17/2021 Elsevier Patient Education  2024 Elsevier Inc.  The above assessment and management plan was discussed with the patient. The patient verbalized understanding of and has agreed to the management plan. Patient is aware to call the clinic if they develop any new symptoms or if symptoms persist or worsen. Patient is aware when to return to the clinic for a follow-up visit. Patient educated on when it is appropriate to go to the emergency department.   Tracee Mccreery [place] Thompson, DNP [facility] [address] [place], [place] [address] [phone]       [1]  Allergies Allergen Reactions   Hydromorphone Nausea And Vomiting

## 2024-04-15 NOTE — Telephone Encounter (Signed)
 FYI Only or Action Required?: FYI only for provider: appointment scheduled on 04/15/24.  Patient was last seen in primary care on 02/08/2024 by Dettinger, Fonda LABOR, MD.  Called Nurse Triage reporting Cough.  Symptoms began about a month ago.  Interventions attempted: OTC medications: Mucinex , Delsym cough syrup.  Symptoms are: stable.  Triage Disposition: No disposition on file.  Patient/caregiver understands and will follow disposition?:   Reason for Disposition  Cough has been present for > 3 weeks  Answer Assessment - Initial Assessment Questions Mucinex , Delsym cough syrup  1. ONSET: When did the cough begin?      Month ago  2. SEVERITY: How bad is the cough today?      Worsening  3. SPUTUM: Describe the color of your sputum (e.g., none, dry cough; clear, white, yellow, green)     Green  4. DIFFICULTY BREATHING: Are you having difficulty breathing? If Yes, ask: How bad is it? (e.g., mild, moderate, severe)      Denies   5. FEVER: Do you have a fever? If Yes, ask: What is your temperature, how was it measured, and when did it start?     Denies  6. CARDIAC HISTORY: Do you have any history of heart disease? (e.g., heart attack, congestive heart failure)      High blood pressure  7. LUNG HISTORY: Do you have any history of lung disease?  (e.g., pulmonary embolus, asthma, emphysema)     Denies  8. OTHER SYMPTOMS: Do you have any other symptoms? (e.g., runny nose, wheezing, chest pain)       Wheezing, new with symptoms. Nasal congestion  Protocols used: Cough - Acute Productive-A-AH  Copied from CRM #[id number]. Topic: Clinical - Red Word Triage >> Apr 15, 2024  8:05 AM Alfonso ORN wrote: Red Word that prompted transfer to Nurse Triage:  been going on for a month have an upper respiratory infection in chest and thick green mucus and getting worse

## 2024-04-15 NOTE — Telephone Encounter (Signed)
Noted  -LS

## 2024-05-13 ENCOUNTER — Ambulatory Visit (INDEPENDENT_AMBULATORY_CARE_PROVIDER_SITE_OTHER): Payer: Self-pay | Admitting: Family Medicine

## 2024-05-13 ENCOUNTER — Encounter: Payer: Self-pay | Admitting: Family Medicine

## 2024-05-13 VITALS — BP 121/75 | HR 82 | Ht 68.0 in | Wt 190.0 lb

## 2024-05-13 DIAGNOSIS — E785 Hyperlipidemia, unspecified: Secondary | ICD-10-CM

## 2024-05-13 DIAGNOSIS — E1159 Type 2 diabetes mellitus with other circulatory complications: Secondary | ICD-10-CM

## 2024-05-13 DIAGNOSIS — M170 Bilateral primary osteoarthritis of knee: Secondary | ICD-10-CM | POA: Diagnosis not present

## 2024-05-13 DIAGNOSIS — I152 Hypertension secondary to endocrine disorders: Secondary | ICD-10-CM

## 2024-05-13 DIAGNOSIS — Z7984 Long term (current) use of oral hypoglycemic drugs: Secondary | ICD-10-CM | POA: Diagnosis not present

## 2024-05-13 DIAGNOSIS — E039 Hypothyroidism, unspecified: Secondary | ICD-10-CM | POA: Diagnosis not present

## 2024-05-13 DIAGNOSIS — M779 Enthesopathy, unspecified: Secondary | ICD-10-CM | POA: Diagnosis not present

## 2024-05-13 DIAGNOSIS — E1169 Type 2 diabetes mellitus with other specified complication: Secondary | ICD-10-CM

## 2024-05-13 DIAGNOSIS — Z23 Encounter for immunization: Secondary | ICD-10-CM

## 2024-05-13 LAB — BAYER DCA HB A1C WAIVED: HB A1C (BAYER DCA - WAIVED): 6.4 % — ABNORMAL HIGH (ref 4.8–5.6)

## 2024-05-13 NOTE — Progress Notes (Signed)
 "  BP 121/75   Pulse 82   Ht 5' 8 (1.727 m)   Wt 190 lb (86.2 kg)   SpO2 96%   BMI 28.89 kg/m    Subjective:   Patient ID: [name], female    DOB: Jan 24, 1951, 74 y.o.   MRN: [id number]  HPI: [name] is a 74 y.o. female presenting on 05/13/2024 for Medical Management of Chronic Issues, Diabetes, and Hypertension   Discussed the use of AI scribe software for clinical note transcription with the patient, who gave verbal consent to proceed.  History of Present Illness   [name] is a 74 year old female who presents with knee pain.  Knee pain - Significant pain aggravated by activities such as ascending and descending steps, especially during holiday decorating - Unable to ascend higher than the bottom bleacher due to severity of pain - Voltaren gel used for pain relief with minimal effectiveness - Received steroid injections in October and November, which provided only short-term relief - Persistent pain despite interventions - Desires to avoid surgical intervention - Uses tramadol  for pain management, which helps her rest at night  Visual disturbances - Floaters present in both eyes, right eye worse than left - Difficulty with glare from headlights at night - Uses sunglasses during the day to manage glare - No follow-up scheduled with eye doctor  Lower extremity quivering - Intermittent quivering in right leg and foot when medication is missed  Medication management - Rybelsus is effective for her condition - Managing medication supply in anticipation of insurance changes with [facility]          Relevant past medical, surgical, family and social history reviewed and updated as indicated. Interim medical history since our last visit reviewed. Allergies and medications reviewed and updated.  Review of Systems  Constitutional:  Negative for chills and fever.  Eyes:  Negative for visual disturbance.  Respiratory:  Negative for chest tightness and shortness of  breath.   Cardiovascular:  Negative for chest pain and leg swelling.  Genitourinary:  Negative for difficulty urinating and dysuria.  Musculoskeletal:  Positive for arthralgias, gait problem and myalgias. Negative for back pain.  Skin:  Negative for rash.  Neurological:  Negative for dizziness, light-headedness and headaches.  Psychiatric/Behavioral:  Negative for agitation and behavioral problems.   All other systems reviewed and are negative.   Per HPI unless specifically indicated above   Allergies as of 05/13/2024       Reactions   Hydromorphone Nausea And Vomiting        Medication List        Accurate as of May 13, 2024  3:11 PM. If you have any questions, ask your nurse or doctor.          pramipexole  0.125 MG tablet Commonly known as: MIRAPEX  Take 1 tablet (0.125 mg total) by mouth daily. The timing of this medication is very important.   azelastine  0.1 % nasal spray Commonly known as: ASTELIN  Place 1 spray into both nostrils 2 (two) times daily. Use in each nostril as directed   Biotin 5000 MCG Tabs Take 10,000 mcg daily by mouth.   CALCIUM 1200 PO Take by mouth.   cefdinir  300 MG capsule Commonly known as: OMNICEF  Take 1 capsule (300 mg total) by mouth 2 (two) times daily. 1 po BID   co-enzyme Q-10 30 MG capsule Take 30 mg by mouth daily.   conjugated estrogens  0.625 MG/GM vaginal cream Commonly known as: Premarin   Use 0.5 gm in vagina at hs for 2 weeks then 2-3 x weekly   fluticasone  50 MCG/ACT nasal spray Commonly known as: FLONASE  Place 1 spray into both nostrils 2 (two) times daily as needed for allergies or rhinitis.   gabapentin  300 MG capsule Commonly known as: NEURONTIN  Take 1 capsule (300 mg total) by mouth 3 (three) times daily.   guaifenesin  400 MG Tabs tablet Commonly known as: HUMIBID E Take 1 tablet (400 mg total) by mouth every 6 (six) hours as needed.   hydrOXYzine  25 MG capsule Commonly known as: VISTARIL  Take 1-2  capsules (25-50 mg total) by mouth at bedtime as needed.   levothyroxine  88 MCG tablet Commonly known as: SYNTHROID  Take 88 mcg by mouth every evening.   lisinopril  10 MG tablet Commonly known as: ZESTRIL  Take 10 mg by mouth every morning.   metFORMIN 500 MG 24 hr tablet Commonly known as: GLUCOPHAGE-XR Take 1,000 mg by mouth in the morning and at bedtime.   methocarbamol  750 MG tablet Commonly known as: ROBAXIN  TAKE ONE TABLET AT BEDTIME AS NEEDED FOR MUSCLE SPASMS   metoprolol  tartrate 25 MG tablet Commonly known as: LOPRESSOR  Take 25 mg by mouth 2 (two) times daily.   multivitamin-lutein Caps capsule Take 1 capsule by mouth daily.   pantoprazole  40 MG tablet Commonly known as: PROTONIX  Take 40 mg by mouth daily.   rosuvastatin 5 MG tablet Commonly known as: CRESTOR Take 5 mg by mouth at bedtime.   Rybelsus 7 MG Tabs Generic drug: Semaglutide Take 1 tablet by mouth daily.   spironolactone  25 MG tablet Commonly known as: ALDACTONE  Take 12.5 mg by mouth daily.   traMADol  50 MG tablet Commonly known as: ULTRAM  Take 1 tablet (50 mg total) by mouth at bedtime as needed.   VITAMIN D PO Take 1 tablet by mouth daily.         Objective:   BP 121/75   Pulse 82   Ht 5' 8 (1.727 m)   Wt 190 lb (86.2 kg)   SpO2 96%   BMI 28.89 kg/m   Wt Readings from Last 3 Encounters:  05/13/24 190 lb (86.2 kg)  04/15/24 190 lb 12.8 oz (86.5 kg)  02/08/24 192 lb (87.1 kg)    Physical Exam Vitals and nursing note reviewed.  Constitutional:      General: She is not in acute distress.    Appearance: She is well-developed. She is not diaphoretic.  Eyes:     Conjunctiva/sclera: Conjunctivae normal.  Cardiovascular:     Rate and Rhythm: Normal rate and regular rhythm.     Heart sounds: Normal heart sounds. No murmur heard. Pulmonary:     Effort: Pulmonary effort is normal. No respiratory distress.     Breath sounds: Normal breath sounds. No wheezing.  Musculoskeletal:         General: No swelling. Normal range of motion.     Left upper leg: Tenderness present.     Right knee: Crepitus present. No tenderness. Normal alignment, normal meniscus and normal patellar mobility.     Left knee: Crepitus present. No bony tenderness. Tenderness present over the patellar tendon. Normal alignment, normal meniscus and normal patellar mobility.       Legs:  Skin:    General: Skin is warm and dry.     Findings: No rash.  Neurological:     Mental Status: She is alert and oriented to person, place, and time.     Coordination: Coordination normal.  Psychiatric:  Behavior: Behavior normal.    Physical Exam   VITALS: BP- 121/75 CHEST: Lungs clear to auscultation bilaterally. CARDIOVASCULAR: Heart regular rate and rhythm, no murmurs.       Results for orders placed or performed in visit on 02/08/24  Bayer DCA Hb A1c Waived   Collection Time: 02/08/24  2:20 PM  Result Value Ref Range   HB A1C (BAYER DCA - WAIVED) 6.6 (H) 4.8 - 5.6 %    Assessment & Plan:   Problem List Items Addressed This Visit       Cardiovascular and Mediastinum   Hypertension associated with diabetes (HCC)     Endocrine   Hyperlipidemia associated with type 2 diabetes mellitus (HCC)   Hypothyroidism   Diabetes mellitus, type 2 (HCC) - Primary   Relevant Orders   Bayer DCA Hb A1c Waived      Bilateral knee osteoarthritis with iliotibial band tendinitis Chronic osteoarthritis with recent exacerbation. Limited relief from previous steroid injections. Symptoms suggest iliotibial band tendinitis. No interest in surgery. - Continue Voltaren gel. - Consider oral anti-inflammatories if tolerated. - Alternate ice and heat for relief. - Engage in stretching and exercises. - Consider elliptical or recumbent bicycle for low-impact exercise.  Type 2 diabetes mellitus with circulatory complications Well-controlled diabetes with A1c of 6.4. Effective management with Rybelsus. Potential  coverage issues with [employer]. - Continue Rybelsus as long as feasible. - Monitor medication coverage with [employer]. - Ensure Rybelsus supply from [facility].  Retinal tears with floaters Floaters present, worse in right eye. Retinal tears noted. Surgery not recommended. Managing symptoms with sunglasses. - Schedule earlier ophthalmologist appointment if symptoms worsen. - Continue using sunglasses. - Exercise caution while driving, especially at night.          Follow up plan: Return in about 3 months (around 08/11/2024), or if symptoms worsen or fail to improve, for Diabetes recheck.  Counseling provided for all of the vaccine components Orders Placed This Encounter  Procedures   Bayer DCA Hb A1c Waived    Fonda Levins, MD [facility] 05/13/2024, 3:11 PM     "

## 2024-08-12 ENCOUNTER — Ambulatory Visit: Admitting: Family Medicine
